# Patient Record
Sex: Female | Born: 1950
Health system: Southern US, Community
[De-identification: ages and names within clinical notes are randomized; demographics above are authoritative.]

## PROBLEM LIST (undated history)

## (undated) DIAGNOSIS — K219 Gastro-esophageal reflux disease without esophagitis: Secondary | ICD-10-CM

## (undated) DIAGNOSIS — R519 Headache, unspecified: Secondary | ICD-10-CM

## (undated) DIAGNOSIS — J302 Other seasonal allergic rhinitis: Secondary | ICD-10-CM

## (undated) DIAGNOSIS — I10 Essential (primary) hypertension: Secondary | ICD-10-CM

## (undated) DIAGNOSIS — I499 Cardiac arrhythmia, unspecified: Secondary | ICD-10-CM

## (undated) DIAGNOSIS — Z973 Presence of spectacles and contact lenses: Secondary | ICD-10-CM

## (undated) HISTORY — DX: Cardiac arrhythmia, unspecified: I49.9

## (undated) HISTORY — PX: BREAST LUMPECTOMY: SHX2

## (undated) HISTORY — PX: FRACTURE SURGERY: SHX138

## (undated) HISTORY — PX: DILATION AND CURETTAGE OF UTERUS: SHX78

## (undated) HISTORY — PX: COLONOSCOPY: SHX174

## (undated) HISTORY — PX: EYE SURGERY: SHX253

---
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
## 1990-10-26 HISTORY — PX: DIAGNOSTIC LAPAROSCOPY: SUR761

## 2000-01-02 ENCOUNTER — Other Ambulatory Visit: Admission: RE | Admit: 2000-01-02 | Discharge: 2000-01-02 | Payer: Self-pay | Admitting: Family Medicine

## 2001-02-22 ENCOUNTER — Other Ambulatory Visit: Admission: RE | Admit: 2001-02-22 | Discharge: 2001-02-22 | Payer: Self-pay | Admitting: Family Medicine

## 2002-01-27 ENCOUNTER — Other Ambulatory Visit: Admission: RE | Admit: 2002-01-27 | Discharge: 2002-01-27 | Payer: Self-pay | Admitting: Family Medicine

## 2002-01-31 ENCOUNTER — Encounter: Payer: Self-pay | Admitting: Family Medicine

## 2002-01-31 ENCOUNTER — Encounter: Admission: RE | Admit: 2002-01-31 | Discharge: 2002-01-31 | Payer: Self-pay | Admitting: Family Medicine

## 2003-02-08 ENCOUNTER — Other Ambulatory Visit: Admission: RE | Admit: 2003-02-08 | Discharge: 2003-02-08 | Payer: Self-pay | Admitting: Family Medicine

## 2003-03-08 ENCOUNTER — Encounter: Payer: Self-pay | Admitting: Family Medicine

## 2003-03-08 ENCOUNTER — Encounter: Admission: RE | Admit: 2003-03-08 | Discharge: 2003-03-08 | Payer: Self-pay | Admitting: Family Medicine

## 2004-03-06 ENCOUNTER — Other Ambulatory Visit: Admission: RE | Admit: 2004-03-06 | Discharge: 2004-03-06 | Payer: Self-pay | Admitting: Family Medicine

## 2004-10-10 ENCOUNTER — Ambulatory Visit: Payer: Self-pay | Admitting: Family Medicine

## 2004-12-08 ENCOUNTER — Ambulatory Visit: Payer: Self-pay | Admitting: Internal Medicine

## 2005-02-24 ENCOUNTER — Ambulatory Visit: Payer: Self-pay | Admitting: Family Medicine

## 2005-02-24 ENCOUNTER — Other Ambulatory Visit: Admission: RE | Admit: 2005-02-24 | Discharge: 2005-02-24 | Payer: Self-pay | Admitting: Family Medicine

## 2005-10-26 LAB — CONVERTED CEMR LAB: Pap Smear: NORMAL

## 2007-06-15 ENCOUNTER — Ambulatory Visit: Payer: Self-pay | Admitting: Family Medicine

## 2007-06-16 DIAGNOSIS — I10 Essential (primary) hypertension: Secondary | ICD-10-CM | POA: Insufficient documentation

## 2007-07-18 ENCOUNTER — Ambulatory Visit: Payer: Self-pay | Admitting: Family Medicine

## 2007-07-18 DIAGNOSIS — M654 Radial styloid tenosynovitis [de Quervain]: Secondary | ICD-10-CM | POA: Insufficient documentation

## 2007-07-27 LAB — CONVERTED CEMR LAB: Pap Smear: NORMAL

## 2007-08-02 ENCOUNTER — Telehealth (INDEPENDENT_AMBULATORY_CARE_PROVIDER_SITE_OTHER): Payer: Self-pay | Admitting: *Deleted

## 2007-09-27 ENCOUNTER — Telehealth: Payer: Self-pay | Admitting: Family Medicine

## 2008-06-06 ENCOUNTER — Ambulatory Visit: Payer: Self-pay | Admitting: Family Medicine

## 2008-06-14 ENCOUNTER — Encounter: Payer: Self-pay | Admitting: Family Medicine

## 2008-06-14 DIAGNOSIS — R7309 Other abnormal glucose: Secondary | ICD-10-CM | POA: Insufficient documentation

## 2008-06-14 LAB — CONVERTED CEMR LAB
ALT: 19 units/L (ref 0–35)
AST: 12 units/L (ref 0–37)
Albumin: 4.3 g/dL (ref 3.5–5.2)
Alkaline Phosphatase: 81 units/L (ref 39–117)
BUN: 22 mg/dL (ref 6–23)
CO2: 23 meq/L (ref 19–32)
Calcium: 9.1 mg/dL (ref 8.4–10.5)
Chloride: 103 meq/L (ref 96–112)
Creatinine, Ser: 0.72 mg/dL (ref 0.40–1.20)
Glucose, Bld: 119 mg/dL — ABNORMAL HIGH (ref 70–99)
Potassium: 3.7 meq/L (ref 3.5–5.3)
Sodium: 140 meq/L (ref 135–145)
TSH: 1.216 microintl units/mL (ref 0.350–4.50)
Total Bilirubin: 0.7 mg/dL (ref 0.3–1.2)
Total Protein: 7.3 g/dL (ref 6.0–8.3)

## 2008-07-18 ENCOUNTER — Ambulatory Visit: Payer: Self-pay | Admitting: Family Medicine

## 2009-06-19 ENCOUNTER — Ambulatory Visit (HOSPITAL_COMMUNITY): Admission: RE | Admit: 2009-06-19 | Discharge: 2009-06-19 | Payer: Self-pay | Admitting: Family Medicine

## 2009-06-19 ENCOUNTER — Ambulatory Visit: Payer: Self-pay | Admitting: Family Medicine

## 2009-06-19 DIAGNOSIS — N63 Unspecified lump in unspecified breast: Secondary | ICD-10-CM | POA: Insufficient documentation

## 2009-06-19 LAB — CONVERTED CEMR LAB
ALT: 22 units/L (ref 0–35)
AST: 15 units/L (ref 0–37)
Albumin: 4.1 g/dL (ref 3.5–5.2)
Alkaline Phosphatase: 71 units/L (ref 39–117)
BUN: 17 mg/dL (ref 6–23)
CO2: 23 meq/L (ref 19–32)
Calcium: 9.3 mg/dL (ref 8.4–10.5)
Chloride: 100 meq/L (ref 96–112)
Cholesterol: 178 mg/dL (ref 0–200)
Creatinine, Ser: 0.82 mg/dL (ref 0.40–1.20)
Glucose, Bld: 113 mg/dL — ABNORMAL HIGH (ref 70–99)
HDL: 41 mg/dL (ref >39)
LDL Cholesterol: 114 mg/dL — ABNORMAL HIGH (ref 0–99)
Potassium: 4.1 meq/L (ref 3.5–5.3)
Sodium: 139 meq/L (ref 135–145)
Total Bilirubin: 0.5 mg/dL (ref 0.3–1.2)
Total CHOL/HDL Ratio: 4.3
Total Protein: 7.3 g/dL (ref 6.0–8.3)
Triglycerides: 113 mg/dL (ref <150)
VLDL: 23 mg/dL (ref 0–40)

## 2009-06-20 ENCOUNTER — Encounter: Payer: Self-pay | Admitting: Family Medicine

## 2009-06-27 ENCOUNTER — Encounter: Payer: Self-pay | Admitting: Family Medicine

## 2009-07-26 ENCOUNTER — Ambulatory Visit: Payer: Self-pay | Admitting: Family Medicine

## 2009-07-26 LAB — CONVERTED CEMR LAB: Rapid Strep: NEGATIVE

## 2010-06-26 ENCOUNTER — Telehealth: Payer: Self-pay | Admitting: Family Medicine

## 2010-07-30 ENCOUNTER — Ambulatory Visit: Payer: Self-pay | Admitting: Family Medicine

## 2010-07-30 LAB — CONVERTED CEMR LAB
Albumin: 4.3 g/dL (ref 3.5–5.2)
BUN: 19 mg/dL (ref 6–23)
CO2: 27 meq/L (ref 19–32)
Calcium: 9.2 mg/dL (ref 8.4–10.5)
Chloride: 99 meq/L (ref 96–112)
Cholesterol: 176 mg/dL (ref 0–200)
Creatinine, Ser: 0.74 mg/dL (ref 0.40–1.20)
Glucose, Bld: 117 mg/dL — ABNORMAL HIGH (ref 70–99)
Glucose, Urine, Semiquant: NEGATIVE
HDL: 40 mg/dL (ref >39)
Hgb A1c MFr Bld: 5.9 %
Nitrite: NEGATIVE
Potassium: 3.9 meq/L (ref 3.5–5.3)
Specific Gravity, Urine: 1.02
Total CHOL/HDL Ratio: 4.4
Triglycerides: 99 mg/dL (ref <150)
WBC Urine, dipstick: NEGATIVE
pH: 7

## 2010-08-01 ENCOUNTER — Encounter: Payer: Self-pay | Admitting: Family Medicine

## 2010-08-01 DIAGNOSIS — J45909 Unspecified asthma, uncomplicated: Secondary | ICD-10-CM | POA: Insufficient documentation

## 2010-08-04 ENCOUNTER — Encounter: Payer: Self-pay | Admitting: Family Medicine

## 2010-09-08 ENCOUNTER — Ambulatory Visit: Payer: Self-pay | Admitting: Family Medicine

## 2010-09-08 DIAGNOSIS — M25579 Pain in unspecified ankle and joints of unspecified foot: Secondary | ICD-10-CM | POA: Insufficient documentation

## 2010-11-26 NOTE — Progress Notes (Signed)
Summary: refill  Phone Note Refill Request Call back at Home Phone [phone] Message from:  Patient  Refills Requested: Medication #1:  HYZAAR 100-25 MG  TABS once daily Next Appointment Scheduled: 07/30/10 Initial call taken by: De Nurse,  June 26, 2010 11:54 AM    Prescriptions: Mauri Reading 100-25 MG  TABS (LOSARTAN POTASSIUM-HCTZ) once daily  #90 x 3   Entered and Authorized by:   Denny Levy MD   Signed by:   Denny Levy MD on 06/26/2010   Method used:   Electronically to        [facility]* (retail)       [facility] [place], [place]  [address]       Ph: [phone]       Fax: [phone]   RxID:   [id number]

## 2010-11-26 NOTE — Assessment & Plan Note (Signed)
Summary: l foot pain/swelling,mc   Vital Signs:  Patient profile:   60 year old female Height:      68 inches Weight:      235 pounds BP sitting:   138 / 82  Vitals Entered By: Denny Levy MD (September 08, 2010 2:51 PM)   History of Present Illness: Long standong B top of foot pain--worse in last 6 months. Also has had several right ankle sprains throughout life and now has chronic right ankle pain--it is east tio twist or "roll".  Pain is at area of foot / anterior ankle, achy and woorse with standing or a lot of walking. rest makes it some better. has to wear "dress" shoes at work--tennis shoes when she walks for exercise. On her feet a fair amount at work--walking more than standing.  also needs refill on BP med. No problems with that.   PERTINENT PMH/PSH: No fractures. No  ankle or foot surgery.  Preventive Screening-Counseling & Management  Alcohol-Tobacco     Smoking Status: never  Current Medications (verified): 1)  Hyzaar 100-25 Mg  Tabs (Losartan Potassium-Hctz) .... Once Daily 2)  Norvasc 10 Mg  Tabs (Amlodipine Besylate) .... Once Daily 3)  Aspirin 81 Mg  Tbec (Aspirin) .... One By Mouth Every Day 4)  Proventil Hfa 108 (90 Base) Mcg/act Aers (Albuterol Sulfate) .... 2 Puffs Q 6 Hrs As Needed Asthma 5)  Fluticasone Propionate 50 Mcg/act Susp (Fluticasone Propionate) .Marland Kitchen.. 1-2 Sprays Each Nostril Qd  Allergies (verified): No Known Drug Allergies  Family History: Mother w breast cancer x 2,became metastatic and led to her death.--older age occurence. 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  mildly overweight Msk:  Bilateral ankles no edema, no redness or warmth. Decreased ability to do heel raises, weakeron right.  STANCE: heels are in hindfoot valgus GAIT normal stride length, mild out toeing B. LEft ankle wobbles into more hindfoot valgus during her stance phase.  FEET mild callous formation, no lesions, neurovascularly intact. Loss of longitudinal and transcverse arch B. Additional Exam:  Patient was fitted for a : standard, cushioned, semi-rigid orthotic. The orthotic was heated and afterward the patient stood on the orthotic blank positioned on the orthotic stand. The patient was positioned in subtalar neutral position and 10 degrees of ankle dorsiflexion in a weight bearing stance. After completion of molding, a stable base was applied to the orthotic blank. The blank was ground to a stable position for weight bearing. Size:9 Base:none used as this was a dress shoe Posting:  medial meel B, medial forefoot    Impression & Recommendations:  Problem # 1:  FOOT PAIN, BILATERAL (ICD-729.5)  Orders: Orthotic Materials, each unit (Z6109)  Problem # 2:  ANKLE PAIN, RIGHT (ICD-719.47)  Orders: Orthotic Materials, each unit (L3002) custom molded otrthotics made spending 45 minutes face to face. She has an old ankle injury and we gave her HO for HEP and expplained it for ankle rehab. The orthotics would only fit into her dress shoes---wheish is what she wears daily---without a base. We will try this. We posted the blank appropriately. If this doe snot work, we  can try again with a base--I think given her fairly significant foot issues that would be better--but she wil have toincrease her shoe size--or alternatively we could do a pair for her work out (walking) shoes. Discussed.  Complete Medication List: 1)  Hyzaar 100-25 Mg Tabs (Losartan potassium-hctz) .... Once daily 2)  Norvasc 10 Mg Tabs (Amlodipine besylate)  .... Once daily 3)  Aspirin 81 Mg Tbec (Aspirin) .... One by mouth every day 4)  Proventil Hfa 108 (90 Base) Mcg/act Aers (Albuterol sulfate) .... 2 puffs q 6 hrs as needed asthma 5)  Fluticasone Propionate 50 Mcg/act Susp (Fluticasone propionate) .Marland Kitchen.. 1-2 sprays each nostril qd   Orders Added: 1)  Est. Patient Level IV [64403] 2)  Orthotic Materials, each unit [L3002]

## 2010-11-26 NOTE — Letter (Signed)
Summary: LAB Letter  [facility]  [address]   [place], [place] [address]   Phone: [phone]  Fax: [phone]    08/01/2010  [facility] [address] [place], [place]  [address]  Dear Ms. Zook,   Your other labs including electrolytes, kidney and liver function were all normal. Your total cholesterol looks good at 176, HDL 40 and LDL116. Great to see you!        Sincerely,   Denny Levy MD   Appended Document: LAB Letter mailed

## 2010-11-26 NOTE — Assessment & Plan Note (Signed)
Summary: cpe,df   Vital Signs:  Patient profile:   60 year old female Height:      67 inches Weight:      242.6 pounds BMI:     38.13 Temp:     98.4 degrees F Pulse rate:   87 / minute BP sitting:   139 / 87  Vitals Entered By: Golden Circle RN (July 30, 2010 8:27 AM)  History of Present Illness: cpe has gained some weight stress level increased at work---3 weeks ago she started walking oin her lunch hour and tis is helping stress some. has not used advair in a year--no wheezing, cough or SOB  Still issues with allergies--claritin OTC helps some. has nasal spray (flonase) but not using it much  OBGYN does pap Mammogram in March  some mild irritation on urination intermittently, also some occasional urgency. no abdominal pain or fever  Habits & Providers  Alcohol-Tobacco-Diet     Alcohol drinks/day: <1     Tobacco Status: never  Exercise-Depression-Behavior     Does Patient Exercise: yes     Type of exercise: walk & stairs     Exercise (avg: min/session): 30-60     Times/week: 7     Have you felt down or hopeless? no     Have you felt little pleasure in things? no     STD Risk: never     Drug Use: never     Seat Belt Use: always  Current Medications (verified): 1)  Hyzaar 100-25 Mg  Tabs (Losartan Potassium-Hctz) .... Once Daily 2)  Norvasc 10 Mg  Tabs (Amlodipine Besylate) .... Once Daily 3)  Aspirin 81 Mg  Tbec (Aspirin) .... One By Mouth Every Day 4)  Proventil Hfa 108 (90 Base) Mcg/act Aers (Albuterol Sulfate) .... 2 Puffs Q 6 Hrs As Needed Asthma 5)  Fluticasone Propionate 50 Mcg/act Susp (Fluticasone Propionate) .Marland Kitchen.. 1-2 Sprays Each Nostril Qd  Allergies (verified): No Known Drug Allergies  Social History: Risk analyst Use:  always Drug Use:  never STD Risk:  never Does Patient Exercise:  yes  Review of Systems       The patient complains of weight gain.  The patient denies anorexia, fever, weight loss, hoarseness, chest pain, syncope, dyspnea on  exertion, peripheral edema, prolonged cough, headaches, hemoptysis, abdominal pain, and severe indigestion/heartburn.         Please see HPI for additional ROS.   Physical Exam  General:  alert, well-developed, well-nourished, and well-hydrated.   Eyes:  vision grossly intact, pupils equal, pupils round, and pupils reactive to light.   Ears:  R ear normal and L ear normal.   Nose:  no external deformity.   Mouth:  pharynx pink and moist.   Neck:  supple, full ROM, no masses, no thyromegaly, and normal carotid upstroke.   Lungs:  normal respiratory effort and normal breath sounds.   Heart:  normal rate, regular rhythm, and no murmur.   Abdomen:  soft, non-tender, and normal bowel sounds.   Genitalia:  deferred Msk:  normal ROM, no joint tenderness, no joint swelling, no joint warmth, and no redness over joints.   Pulses:  DP and radial B 2+ Extremities:  no edema Neurologic:  alert & oriented X3, cranial nerves II-XII intact, strength normal in all extremities, and gait normal.   Skin:  several skin tags, no worrisome lesions on complete skin exam Psych:  Oriented X3, memory intact for recent and remote, normally interactive, good eye contact, not anxious  appearing, and not depressed appearing.     Impression & Recommendations:  Problem # 1:  WELL ADULT EXAM (ICD-V70.0)  Orders: [facility] - Est  40-64 yrs ([address]) ensouraged exercise pap and pelvic and breast exam done by GYN Tresa Endo Spade) and are current flu shot at work   Problem # 2:  DYSURIA (ICD-788.1)  Orders: Urinalysis-[facility] (00000)   Problem # 3:  HYPERGLYCEMIA, BORDERLINE (ICD-790.29)  Orders: A1C-[facility] (60454) Lipid-[facility] ([address]-91478)   Complete Medication List: 1)  Hyzaar 100-25 Mg Tabs (Losartan potassium-hctz) .... Once daily 2)  Norvasc 10 Mg Tabs (Amlodipine besylate) .... Once daily 3)  Aspirin 81 Mg Tbec (Aspirin) .... One by mouth every day 4)  Proventil Hfa 108 (90 Base) Mcg/act Aers (Albuterol sulfate)  .... 2 puffs q 6 hrs as needed asthma 5)  Fluticasone Propionate 50 Mcg/act Susp (Fluticasone propionate) .Marland Kitchen.. 1-2 sprays each nostril qd Comp Met-[facility] [phone]) A1C-[facility] (57846) Urinalysis-[facility] (00000) Lipid-[facility] (96[address]-28413) [facility] - Est  40-64 yrs (244[address]) Prescriptions: PROVENTIL HFA 108 (90 BASE) MCG/ACT AERS (ALBUTEROL SULFATE) 2 puffs q 6 hrs as needed asthma  #1 x 12   Entered and Authorized by:   Denny Levy MD   Signed by:   Denny Levy MD on 07/30/2010   Method used:   Print then Give to Patient   RxID:   [id number] NORVASC 10 MG  TABS (AMLODIPINE BESYLATE) once daily  #90 x 3   Entered and Authorized by:   Denny Levy MD   Signed by:   Denny Levy MD on 07/30/2010   Method used:   Print then Give to Patient   RxID:   [id number]    Impression & Recommendations:  Orders: [facility] - Est  40-64 yrs ([address])   Orders: Urinalysis-[facility] (00000)   Orders: A1C-[facility] (60630) Lipid-[facility] ([address]-93235)   Complete Medication List: 1)  Hyzaar 100-25 Mg Tabs (Losartan potassium-hctz) .... Once daily 2)  Norvasc 10 Mg Tabs (Amlodipine besylate) .... Once daily 3)  Aspirin 81 Mg Tbec (Aspirin) .... One by mouth every day 4)  Proventil Hfa 108 (90 Base) Mcg/act Aers (Albuterol sulfate) .... 2 puffs q 6 hrs as needed asthma 5)  Fluticasone Propionate 50 Mcg/act Susp (Fluticasone propionate) .Marland Kitchen.. 1-2 sprays each nostril qd  Other Orders: Comp Met-[facility] [phone])   Laboratory Results   Urine Tests  Date/Time Received: July 30, 2010 9:06 AM  Date/Time Reported: July 30, 2010 9:48 AM   Routine Urinalysis   Color: yellow Appearance: Clear Glucose: negative   (Normal Range: Negative) Bilirubin: negative   (Normal Range: Negative) Ketone: negative   (Normal Range: Negative) Spec. Gravity: 1.020   (Normal Range: 1.003-1.035) Blood: trace-intact   (Normal Range: Negative) pH: 7.0   (Normal Range: 5.0-8.0) Protein: negative   (Normal Range: Negative) Urobilinogen: 0.2    (Normal Range: 0-1) Nitrite: negative   (Normal Range: Negative) Leukocyte Esterace: negative   (Normal Range: Negative)  Urine Microscopic WBC/HPF: rare RBC/HPF: 0-3 Bacteria/HPF: 1+ Epithelial/HPF: 5-10    Comments: ...............test performed by......Marland KitchenBonnie A. [place], MLS (ASCP)cm   Blood Tests   Date/Time Received: July 30, 2010 9:06 AM  Date/Time Reported: July 30, 2010 9:48 AM   HGBA1C: 5.9%   (Normal Range: Non-Diabetic - 3-6%   Control Diabetic - 6-8%)  Comments: ...............test performed by......Marland KitchenBonnie A. [place], MLS (ASCP)cm      Prevention & Chronic Care Immunizations   Influenza vaccine: given at [facility] 07/2010  (07/30/2010)    Tetanus booster: 10/27/2003: given   Tetanus  booster due: 10/26/2013    Pneumococcal vaccine: Not documented  Colorectal Screening   Hemoccult: Not documented    Colonoscopy: normal  (10/26/2005)   Colonoscopy due: 10/27/2015  Other Screening   Pap smear: normal  (07/27/2007)   Pap smear due: 07/26/2008    Mammogram: normal  (12/26/2007)   Mammogram due: 12/25/2008   Smoking status: never  (07/30/2010)  Lipids   Total Cholesterol: 178  (06/19/2009)   Lipid panel action/deferral: Lipid Panel ordered   LDL: 114  (06/19/2009)   LDL Direct: Not documented   HDL: 41  (06/19/2009)   Triglycerides: 113  (06/19/2009)  Hypertension   Last Blood Pressure: 139 / 87  (07/30/2010)   Serum creatinine: 0.82  (06/19/2009)   Serum potassium 4.1  (06/19/2009) CMP ordered     Hypertension flowsheet reviewed?: Yes   Progress toward BP goal: At goal  Self-Management Support :    Hypertension self-management support: Not documented

## 2010-11-26 NOTE — Letter (Signed)
Summary: [facility]  [address]   [place], [place] [address]   Phone: [phone]  Fax: [phone]    08/01/2010  [facility] [address] [place], [place]  [address]  Dear Ms. Golding,  Urine looked normal and your A1C 9longterm blood sugar) totally normal.         Sincerely,   Denny Levy MD  Appended Document: LABLetter mailed

## 2010-11-26 NOTE — Miscellaneous (Signed)
  Clinical Lists Changes  Problems: Changed problem from History of  ASTHMA (ICD-493.90) to History of  ASTHMA, INTERMITTENT, MILD (ICD-493.90)

## 2010-12-04 ENCOUNTER — Encounter: Payer: Self-pay | Admitting: *Deleted

## 2011-01-15 ENCOUNTER — Ambulatory Visit (INDEPENDENT_AMBULATORY_CARE_PROVIDER_SITE_OTHER): Payer: PRIVATE HEALTH INSURANCE | Admitting: Family Medicine

## 2011-01-15 ENCOUNTER — Encounter: Payer: Self-pay | Admitting: Family Medicine

## 2011-01-15 VITALS — BP 176/96 | Ht 68.0 in | Wt 240.0 lb

## 2011-01-15 DIAGNOSIS — M79609 Pain in unspecified limb: Secondary | ICD-10-CM

## 2011-01-15 DIAGNOSIS — I10 Essential (primary) hypertension: Secondary | ICD-10-CM

## 2011-01-15 DIAGNOSIS — M79673 Pain in unspecified foot: Secondary | ICD-10-CM

## 2011-01-16 NOTE — Progress Notes (Signed)
  Subjective:    Patient ID: [facility]ra [facility], female    DOB: Feb 25, 1951, 60 y.o.   MRN: [id number]  HPI  Continued (but improved) foot pain. We made her some orthotics and that has helped--she cannotfit them in her dress shoes however so many days she cannot wear them.Pain is dorsal foot / ankle.  Review of Systems Has gained a few pounds    Objective:   Physical Exam    GAIT significant  Varus tilt of her sbtalar joint during weight bearing    Assessment & Plan:  Foot deformity causing pain. Made a new pair of orthotics for dress shoes using a modified sports insole and some medial heel wedges to take pressure off ankle.

## 2011-01-16 NOTE — Assessment & Plan Note (Signed)
Elevated BP at todays visit even with recheck Will have her get BP readings for next few weeks and email them to me

## 2011-07-24 ENCOUNTER — Encounter: Payer: Self-pay | Admitting: Family Medicine

## 2011-07-24 ENCOUNTER — Ambulatory Visit (INDEPENDENT_AMBULATORY_CARE_PROVIDER_SITE_OTHER): Payer: PRIVATE HEALTH INSURANCE | Admitting: Family Medicine

## 2011-07-24 VITALS — BP 144/88 | HR 87

## 2011-07-24 DIAGNOSIS — M25571 Pain in right ankle and joints of right foot: Secondary | ICD-10-CM | POA: Insufficient documentation

## 2011-07-24 DIAGNOSIS — M79671 Pain in right foot: Secondary | ICD-10-CM

## 2011-07-24 DIAGNOSIS — M79609 Pain in unspecified limb: Secondary | ICD-10-CM

## 2011-07-24 NOTE — Patient Instructions (Signed)
I think you have a "stress reaction" (which is the continuum of stress fracture) of your 5th metatarsal head. Let's try the post op shoe WITH the insert for 2-3 weeks. At 7-10 days, ask yourself "Is this getting any better?" If hte answer is NO, then call me and we will do an additional imaging test, likely a foot x ray. If the answer is YES, then continue the shoe / insert and I will see you back in 3 weeks. Great to see you! Hello to your husband!

## 2011-07-24 NOTE — Progress Notes (Signed)
  Subjective:    Patient ID: Kendra Thompson, female    DOB: 1951-06-14, 60 y.o.   MRN: [id number]  HPI  Right lateral foot pain for about 2 months. First noticed it after wearing some new sandals. She did wear her orthotics and knees but had a lot of pain so eventually she threw the sandals away. She has not really warn her orthotics since then. The orthotics did seem to help with her ankle issues. Pain is in the right fifth metatarsal area. Worse with standing. No pain at rest. Sometimes walking is excruciating. Has noted no swelling, no redness, no warmth.  Review of Systems    think she's gained a little bit of weight. Denies fever, sweats, chills. Objective:   Physical Exam  GENERAL: Well-developed female in no acute distress. Overweight. FEET: Mild loss of longitudinal and transverse arch bilaterally. Intact sensation to soft touch. Some callus formation under the plantar surface of the fifth metatarsal head. This is the area of tenderness. Palpation here reproduces her pain. Negative squeeze test for the metatarsal heads. She is nontender to palpation at the proximal portion of the fifth metatarsal.  ULTRASOUND:  Edema noted around the fifth MTP joint. There is also a small calcified bone that is likely a sesamoid although it could be an old chip fracture. There is some arthritic change at the joint. Complete healing of the fifth metatarsal ray reveals no evidence of cortical edema or cortex disruption.      Assessment & Plan:  Right fifth metatarsal head pain. I think she has either  a sesamoiditis or a bursitis ? stress reaction at the fifth MTP.  Due to the callus on the plantar portion of the foot on lateral side it is evident  she's been doing a lot more weight on the lateral portion of her foot since we placed her in some medial heel wedge to help her ankle pain. The ankle pain has resolved. Today I placed her in a postop shoe with a metatarsal pad, small, for the right foot.  I'll see her back in 2-3 weeks. Where the shoe during the day. In one week if she's not improving she'll let me know and I would recommend doing x-ray at that time to make sure we've not missed something.

## 2011-08-04 ENCOUNTER — Other Ambulatory Visit: Payer: Self-pay | Admitting: Family Medicine

## 2011-08-04 ENCOUNTER — Encounter: Payer: Self-pay | Admitting: *Deleted

## 2011-08-04 DIAGNOSIS — M79671 Pain in right foot: Secondary | ICD-10-CM

## 2011-08-04 NOTE — Progress Notes (Unsigned)
  Subjective:    Patient ID: Kendra Thompson, female    DOB: 29-Jul-1951, 60 y.o.   MRN: [id number]  HPI    Review of Systems     Objective:   Physical Exam        Assessment & Plan:  Pt schdld to have MRI at [facility] on Monday October 22nd at 6pm. Pt to arrive at 5:30 for registration. Pt contacted and informed of this.

## 2011-08-04 NOTE — Progress Notes (Signed)
[place] Please set her up for RIGHT FOOT MRI Her daytime number is  Day time # [id number]. Not pregnant, no metal, no pacemaker, probably not claustrophobic THANKS! Denny Levy PS she may want it done at [facility] as she works there--I forget to ask her that question. You can call her THANKS! Denny Levy

## 2011-08-12 ENCOUNTER — Ambulatory Visit (INDEPENDENT_AMBULATORY_CARE_PROVIDER_SITE_OTHER): Payer: PRIVATE HEALTH INSURANCE | Admitting: Family Medicine

## 2011-08-12 ENCOUNTER — Encounter: Payer: Self-pay | Admitting: Family Medicine

## 2011-08-12 VITALS — BP 138/93 | HR 96 | Temp 98.3°F | Ht 68.0 in | Wt 238.0 lb

## 2011-08-12 DIAGNOSIS — Z23 Encounter for immunization: Secondary | ICD-10-CM

## 2011-08-12 DIAGNOSIS — I10 Essential (primary) hypertension: Secondary | ICD-10-CM

## 2011-08-12 DIAGNOSIS — Z0000 Encounter for general adult medical examination without abnormal findings: Secondary | ICD-10-CM

## 2011-08-12 DIAGNOSIS — R7309 Other abnormal glucose: Secondary | ICD-10-CM

## 2011-08-12 DIAGNOSIS — J45909 Unspecified asthma, uncomplicated: Secondary | ICD-10-CM

## 2011-08-12 LAB — COMPREHENSIVE METABOLIC PANEL
ALT: 27 U/L (ref 0–35)
AST: 14 U/L (ref 0–37)
Albumin: 4.3 g/dL (ref 3.5–5.2)
CO2: 25 mEq/L (ref 19–32)
Calcium: 9.4 mg/dL (ref 8.4–10.5)
Chloride: 100 mEq/L (ref 96–112)
Potassium: 3.7 mEq/L (ref 3.5–5.3)
Sodium: 138 mEq/L (ref 135–145)
Total Protein: 7.5 g/dL (ref 6.0–8.3)

## 2011-08-12 LAB — LIPID PANEL
Cholesterol: 175 mg/dL (ref 0–200)
Total CHOL/HDL Ratio: 4.5 Ratio

## 2011-08-12 MED ORDER — LOSARTAN POTASSIUM-HCTZ 100-25 MG PO TABS: 1.0000 | ORAL_TABLET | Freq: Every day | ORAL | Status: DC

## 2011-08-12 MED ORDER — PNEUMOCOCCAL VAC POLYVALENT 25 MCG/0.5ML IJ INJ: 0.5000 mL | INJECTION | Freq: Once | INTRAMUSCULAR | Status: DC

## 2011-08-12 MED ORDER — AMLODIPINE BESYLATE 10 MG PO TABS: 10.0000 mg | ORAL_TABLET | Freq: Every day | ORAL | Status: DC

## 2011-08-12 NOTE — Patient Instructions (Signed)
I will send you a note about your labwork. We discussed her colonoscopy and I think it is fine to wait another year or 2 for a repeat. We can rediscuss that again next year. If he should develop some symptoms of changing her stool, unusual weight loss, blood in her stool or abdominal pain, then I would recommend repeat sooner  Regarding the Zostavax vaccine, I would check with her insurance company and see how much they cover. If you need a prescription for that, just let me know and I will mail it to you. I did receive a copy of your mammogram and it was normal. If your cough does not resolve in the next week or so let me know..With your history of asthma, you  might benefit from 2-3 days of a steroid burst. I look forward to seeing next week after your foot MRI.  It is great to see today!

## 2011-08-13 ENCOUNTER — Encounter: Payer: Self-pay | Admitting: Family Medicine

## 2011-08-13 NOTE — Progress Notes (Signed)
Subjective:    Patient ID: [facility]ra [facility], female    DOB: December 14, 1950, 60 y.o.   MRN: [id number]  HPI #1. Here for preventive visit   Main issue are:  Marland Kitchen#2:   some weight gain. She is struggling with right foot pain. Has MRI scheduled next week. Has been unable to exercise. Feels like that's why her blood pressure is up a little bit as is her weight.  #3. Had a colonoscopy 5 years ago that was normal. Her initial colonoscopy prior to that showed some polyps and she had the early repeat. They have sent her cart telling her to time for another one. She has questions about this and wonders if she really needs to get one now or can wait till the full 10 years. She saw Dr. Judie Petit. I SHRA.  #4. HTN:Not having any problems with her blood pressure pills.   #5.Her allergies have been well controlled with the Flonase.   #6.astma: Has not had to use her rescue inhaler much at all. Over the last week she has had a cold with some nasal congestion and a little bit of nonproductive cough. No wheezing. No shortness of breath.   Review of Systems  Constitutional: Positive for activity change. Negative for fever and fatigue.  HENT: Positive for postnasal drip. Negative for neck stiffness.   Respiratory: Positive for cough. Negative for chest tightness, shortness of breath and wheezing.   Cardiovascular: Negative for chest pain and palpitations.  Gastrointestinal: Negative for abdominal pain and constipation.  Genitourinary: Negative for vaginal discharge.  Musculoskeletal: Negative for myalgias.  Skin: Negative for rash.  Neurological: Negative for dizziness and weakness.  Psychiatric/Behavioral: Negative for confusion and agitation.       Objective:   Physical Exam  Vital signs reviewed GENERALl: Well developed, well nourished, in no acute distress. NECK: Supple, FROM, without lymphadenopathy.  THYROID: normal without nodularity CAROTID ARTERIES: without bruits LUNGS: clear to auscultation  bilaterally. No wheezes or rales. HEART: Regular rate and rhythm, no murmurs ABDOMEN: soft with positive bowel sounds. No masses, no guarding, no rebound. NEURO: No gross focal deficits. Strength is normal and symmetrical in all 4 extremities. She rises easily out of a chair without any assistance and has a normal gait except for the fact that she is wearing a Cam Walker on the right foot so she has a little bit of a limp secondary to that. MSK: Right foot tender to palpation over the dorsal lateral surface. SKIN: Complete skin exam reveals normal nevi, no worrisome lesions. She does not have it a lot of actinic solar damage. She has one wart on the back of her right calf it appears benign. BREASTS: Bilaterally symmetrical. No nipple discharge, normal Arreola. There is no worrisome mass. Axilla are normal.        Assessment & Plan:  #1. Preventive visit. We discussed her colonoscopy. She had polyps removed on her first exam 15 years ago, a normal colonoscopy 5 years ago. GI send her a card recommending colonoscopy now at the five-year followup. She would like to postpone at least a year. Her only risk factor is the polyps are found I don't have her record of their exact pathology. Given the five-year recommend followup I suspect they were adenomas. She did have a brother with stomach cancer but that was thought to be secondary to his alcoholism. We agreed to compromise and do her colonoscopy somewhere between 5 and 10 year range probably about the sixth or seventh year.  She has received her flu shot and her tetanus shot. Her mammogram was in April and was normal. We will give her Pneumovax today as she is a little bit immunocompromised secondary to her asthma.  Health Maintenance  Topic Date Due  . Influenza Vaccine  07/26/2012  . Mammogram  02/09/2013  . Pap Smear  11/19/2013  . Colonoscopy  04/11/2017  . Tetanus/tdap  08/05/2021   #2. Hypertension. She's a little bit suboptimally controlled.  We discussed. We decided to go ahead and take care of her foot, get her back into some regular exercise in in followup her blood pressure in about 2-1/2 months. If she still elevated at that time then I would recommend to add an additional agent.  Labs today. I will see her next week in followup of her foot pain after her MRI.

## 2011-08-14 ENCOUNTER — Ambulatory Visit: Payer: PRIVATE HEALTH INSURANCE | Admitting: Family Medicine

## 2011-08-18 ENCOUNTER — Ambulatory Visit: Payer: PRIVATE HEALTH INSURANCE | Admitting: Family Medicine

## 2011-08-20 ENCOUNTER — Telehealth: Payer: Self-pay | Admitting: Family Medicine

## 2011-08-20 NOTE — Telephone Encounter (Signed)
Amy Plz call her and tell her the MRI showed NOTHING abnormal in her ;lateral foot. This is a little weird. What I would like is for her to go down to the radiology dept at [facility] and get a disc of the images and mail it to me. Until then, keep wearing the post op shoe. See also if you will whether or not she is doing any better?? THANKS! Denny Levy

## 2011-08-20 NOTE — Telephone Encounter (Signed)
Spoke with pt- she states she will get disc and mail to [place].   Also, states she was doing a little better this weekend, but walked a lot Monday -foot was very painful Tuesday.

## 2011-08-25 ENCOUNTER — Ambulatory Visit (INDEPENDENT_AMBULATORY_CARE_PROVIDER_SITE_OTHER): Payer: PRIVATE HEALTH INSURANCE | Admitting: Sports Medicine

## 2011-08-25 VITALS — BP 126/70

## 2011-08-25 DIAGNOSIS — M79671 Pain in right foot: Secondary | ICD-10-CM

## 2011-08-25 DIAGNOSIS — M79609 Pain in unspecified limb: Secondary | ICD-10-CM

## 2011-08-25 NOTE — Progress Notes (Signed)
  Subjective:    Patient ID: Kendra Thompson, female    DOB: Sep 15, 1951, 60 y.o.   MRN: [id number]  HPI Continuing right lateral foot pain. Has had her MRI. Has continued to wear the postop shoe with a metatarsal pad and worsens all with minimal relief.   Review of Systems No warmth or erythema of the right foot. No rash. No fever.    Objective:   Physical Exam  Vital signs reviewed. GENERAL: Well developed, well nourished, no acute distress FOOT: Right foot tender to palpation over the fifth distal metatarsal head area. Pressure on the plantar surface of this MTP joint reproduces her pain. I re\re ultrasound the area and there are some arthritic changes there is minimal fluid. MRI images reviewed with her in detail. There is no evidence of stress fracture. There is no evidence of soft tissue mass.    INJECTION: Patient was given informed consent, signed copy in the chart. Appropriate time out was taken. Area prepped and draped in usual sterile fashion. One half cc of methylprednisolone 40 mg/ml plus  one half cc of 1% lidocaine without epinephrine was injected into the right fifth MTP joint using a(n) plantar approach. The patient tolerated the procedure well. There were no complications. Post procedure instructions were given.     Assessment & Plan:  Long discussion with her regarding her options. At this point the only source I can consistently think would give her this type of pain is arthritic change at the MTP #5. We discussed options and decided to try a corticosteroid injection. We'll continue the postop shoe. She's not had improvement in the next 7-10 days I would refer her to orthopedic surgery for further eval.

## 2011-08-25 NOTE — Patient Instructions (Signed)
Let me know in 7 days if improved or not If not, surgery eval

## 2011-09-02 ENCOUNTER — Encounter: Payer: Self-pay | Admitting: Family Medicine

## 2011-11-11 ENCOUNTER — Ambulatory Visit (INDEPENDENT_AMBULATORY_CARE_PROVIDER_SITE_OTHER): Payer: PRIVATE HEALTH INSURANCE | Admitting: Family Medicine

## 2011-11-11 ENCOUNTER — Encounter: Payer: Self-pay | Admitting: Family Medicine

## 2011-11-11 VITALS — BP 138/78 | HR 98 | Temp 98.6°F | Ht 68.0 in | Wt 252.0 lb

## 2011-11-11 DIAGNOSIS — IMO0002 Reserved for concepts with insufficient information to code with codable children: Secondary | ICD-10-CM

## 2011-11-11 DIAGNOSIS — S46919A Strain of unspecified muscle, fascia and tendon at shoulder and upper arm level, unspecified arm, initial encounter: Secondary | ICD-10-CM

## 2011-11-11 DIAGNOSIS — M654 Radial styloid tenosynovitis [de Quervain]: Secondary | ICD-10-CM

## 2011-11-11 DIAGNOSIS — I10 Essential (primary) hypertension: Secondary | ICD-10-CM

## 2011-11-12 NOTE — Progress Notes (Signed)
  Subjective:    Patient ID: Kendra Thompson, female    DOB: 12/22/1950, 61 y.o.   MRN: [id number]  HPI  1. Left wrist / thumb pain. Has had i previously--she is right hand dominant. Pain worse with certain motions. No numbness of fingers. No specific injury. Pain 4-5 /10 at worst. 2. F/u foot pain---much better since the injection. 3. Upper back and right shoulder blade area pain. 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 F/u htn--taking her meds reg--no problems. Has put on weight over holidays. Denies chest pain or SOB, no dizziness.  Review of Systems Pertinent review of systems: negative for fever .     Objective:   Physical Exam  Vital signs reviewed. GENERAL: Well developed, well nourished, no acute distress CV RRR WRIST ttp left thumb tendons, APB more so than APL, FROm of thumb and normal hand strength including grip and intrinsics. Resisted thumb extension reproduces pain. No redness or deformity of thumb or hand. BACK ttp right rhomboid area      Assessment & Plan:  1. htn--better control. Discussed weight loss. May start weight watchers 2. De quervains---CSI and she will use her brace for several adays. When pain improved, will start HEP which we discussed 3, rhomboid strain---shoulder shrugs and push ups, posture arareness 4. Foot---seems resolved

## 2012-01-27 ENCOUNTER — Encounter: Payer: Self-pay | Admitting: Family Medicine

## 2012-02-10 ENCOUNTER — Ambulatory Visit (INDEPENDENT_AMBULATORY_CARE_PROVIDER_SITE_OTHER): Payer: PRIVATE HEALTH INSURANCE | Admitting: Family Medicine

## 2012-02-10 ENCOUNTER — Encounter: Payer: Self-pay | Admitting: Family Medicine

## 2012-02-10 VITALS — BP 122/76 | HR 90 | Temp 98.5°F | Ht 68.0 in | Wt 246.1 lb

## 2012-02-10 DIAGNOSIS — R1013 Epigastric pain: Secondary | ICD-10-CM

## 2012-02-10 DIAGNOSIS — R229 Localized swelling, mass and lump, unspecified: Secondary | ICD-10-CM

## 2012-02-10 DIAGNOSIS — R7309 Other abnormal glucose: Secondary | ICD-10-CM

## 2012-02-10 DIAGNOSIS — R2230 Localized swelling, mass and lump, unspecified upper limb: Secondary | ICD-10-CM

## 2012-02-10 LAB — BASIC METABOLIC PANEL
BUN: 19 mg/dL (ref 6–23)
CO2: 26 mEq/L (ref 19–32)
Chloride: 99 mEq/L (ref 96–112)
Glucose, Bld: 111 mg/dL — ABNORMAL HIGH (ref 70–99)
Potassium: 3.7 mEq/L (ref 3.5–5.3)
Sodium: 137 mEq/L (ref 135–145)

## 2012-02-11 ENCOUNTER — Encounter: Payer: Self-pay | Admitting: Family Medicine

## 2012-02-11 DIAGNOSIS — R1013 Epigastric pain: Secondary | ICD-10-CM | POA: Insufficient documentation

## 2012-02-11 MED ORDER — ESOMEPRAZOLE MAGNESIUM 40 MG PO CPDR: 40.0000 mg | DELAYED_RELEASE_CAPSULE | Freq: Every day | ORAL | Status: DC

## 2012-02-11 NOTE — Progress Notes (Signed)
  Subjective:    Patient ID: Kendra Thompson, female    DOB: 1950/11/11, 61 y.o.   MRN: [id number]  HPI  #1. Having a lot of mid epigastric pain that radiates directly through to her back. She has used over-the-counter Prilosec twice each for a 14 day period. She had some improvement but the minute she stopped the Prilosec it worsened again she is very worried as her half brother died from stomach cancer in his early 76s. She would like to consider EGD. #2. New lump on her right arm. She just noticed it in the last couple of weeks. She doesn't recall being there before but doesn't think it's grown since she's solid first. In fact it may have decreased in size. 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 She wonders if she banged it against something. It is mildly tender, there's been no bruising. She's not having numbness in her hand and no change in muscle function of her arm or hand. #3. Overweight: She has continued to try to need better but has not been doing a lot of regular exercise. She has lost a small amount of weight.  Review of Systems    denies fever, sweats, chills. Please see history of present illness above for pertinent review of systems. Objective:   Physical Exam  Vital signs reviewed. GENERAL: Well developed, well nourished, no acute distress MSK: Right forearm is normal in muscle bulk and tone. There is a circular mass that's about 4 mm high in about 1.2 cm in diameter on the extensor portion of the muscle mass. It is slightly mobile, a little bit poorly defined around the borders. It is soft and nontender. The rest of the right upper extremity exam including neurovascular status is normal.      Assessment & Plan:  #1. Increased epigastric pain probably related to reflux. With her family history of stomach cancer I will go ahead and refer her for EGD. I will start her on nexium. #2. Mass on the right arm. Most likely this is the beginning of a lipoma  or this is result of some trauma with contusion. We decided after discussion to follow it for the next 2-3 weeks and if it remains I will see her at sports medicine clinic where we will do an ultrasound to further evaluate. We discussed whether or not she wanted to move directly to MRI and she is comfortable with following a conservative approach. Should it suddenly started to enlarge or change before I see her back she'll let me know. #3. Obesity. Encouraged to continue her weight loss efforts. Will recheck her blood sugar today as she has had elevated random blood sugar recently.

## 2012-02-12 ENCOUNTER — Encounter: Payer: Self-pay | Admitting: Gastroenterology

## 2012-02-15 ENCOUNTER — Telehealth: Payer: Self-pay | Admitting: Family Medicine

## 2012-02-15 NOTE — Telephone Encounter (Signed)
LVM to inform patient that I am faxing over the endo form to [facility] and they will contact her about appointment

## 2012-02-15 NOTE — Telephone Encounter (Signed)
Patient is calling because she needs her Endoscopy to be schedule with Dr. Malachi Paradise at [facility] - [phone].  It was scheduled with  and needs to be changed to Dr. Donnal Debar for her insurance to cover as much as possible.

## 2012-02-22 ENCOUNTER — Encounter: Payer: Self-pay | Admitting: Family Medicine

## 2012-03-03 ENCOUNTER — Ambulatory Visit: Payer: PRIVATE HEALTH INSURANCE | Admitting: Gastroenterology

## 2012-04-04 ENCOUNTER — Ambulatory Visit (INDEPENDENT_AMBULATORY_CARE_PROVIDER_SITE_OTHER): Payer: PRIVATE HEALTH INSURANCE | Admitting: Family Medicine

## 2012-04-04 ENCOUNTER — Encounter: Payer: Self-pay | Admitting: Family Medicine

## 2012-04-04 VITALS — BP 130/82 | HR 89 | Ht 68.0 in | Wt 246.0 lb

## 2012-04-04 DIAGNOSIS — R2230 Localized swelling, mass and lump, unspecified upper limb: Secondary | ICD-10-CM

## 2012-04-04 DIAGNOSIS — R229 Localized swelling, mass and lump, unspecified: Secondary | ICD-10-CM

## 2012-04-05 DIAGNOSIS — R2230 Localized swelling, mass and lump, unspecified upper limb: Secondary | ICD-10-CM | POA: Insufficient documentation

## 2012-04-05 NOTE — Progress Notes (Signed)
Patient ID: Kendra Thompson, female   DOB: [birth year], 61 y.o.   MRN: [id number] Several months mass in right forearm. Nontender. Has gotten a little but smaller she thinks. noskin changes, no parasthesias or loss of strength. Fingers and wrist have normal strength and ROM. See problem oriented documentation.

## 2012-04-25 ENCOUNTER — Encounter: Payer: Self-pay | Admitting: Family Medicine

## 2012-05-24 DIAGNOSIS — Z01419 Encounter for gynecological examination (general) (routine) without abnormal findings: Secondary | ICD-10-CM | POA: Insufficient documentation

## 2012-06-17 ENCOUNTER — Other Ambulatory Visit: Payer: Self-pay | Admitting: Family Medicine

## 2012-06-17 DIAGNOSIS — R1013 Epigastric pain: Secondary | ICD-10-CM

## 2012-06-17 MED ORDER — ESOMEPRAZOLE MAGNESIUM 40 MG PO CPDR: 40.0000 mg | DELAYED_RELEASE_CAPSULE | Freq: Every day | ORAL | Status: DC

## 2012-06-20 DIAGNOSIS — Z860100 Personal history of colon polyps, unspecified: Secondary | ICD-10-CM | POA: Insufficient documentation

## 2012-08-31 ENCOUNTER — Encounter: Payer: Self-pay | Admitting: Family Medicine

## 2012-08-31 ENCOUNTER — Ambulatory Visit (INDEPENDENT_AMBULATORY_CARE_PROVIDER_SITE_OTHER): Payer: PRIVATE HEALTH INSURANCE | Admitting: Family Medicine

## 2012-08-31 VITALS — BP 140/78 | HR 96 | Temp 97.7°F | Ht 68.0 in | Wt 244.8 lb

## 2012-08-31 DIAGNOSIS — R7309 Other abnormal glucose: Secondary | ICD-10-CM

## 2012-08-31 DIAGNOSIS — Z0000 Encounter for general adult medical examination without abnormal findings: Secondary | ICD-10-CM

## 2012-08-31 DIAGNOSIS — R1013 Epigastric pain: Secondary | ICD-10-CM

## 2012-08-31 DIAGNOSIS — I10 Essential (primary) hypertension: Secondary | ICD-10-CM

## 2012-08-31 LAB — COMPREHENSIVE METABOLIC PANEL
Albumin: 4.2 g/dL (ref 3.5–5.2)
Alkaline Phosphatase: 78 U/L (ref 39–117)
BUN: 18 mg/dL (ref 6–23)
Glucose, Bld: 99 mg/dL (ref 70–99)
Potassium: 3.7 mEq/L (ref 3.5–5.3)

## 2012-08-31 MED ORDER — LOSARTAN POTASSIUM-HCTZ 100-25 MG PO TABS: 1.0000 | ORAL_TABLET | Freq: Every day | ORAL | Status: DC

## 2012-08-31 MED ORDER — ESOMEPRAZOLE MAGNESIUM 40 MG PO CPDR: 40.0000 mg | DELAYED_RELEASE_CAPSULE | Freq: Every day | ORAL | Status: DC

## 2012-08-31 MED ORDER — ALBUTEROL SULFATE HFA 108 (90 BASE) MCG/ACT IN AERS: 2.0000 | INHALATION_SPRAY | Freq: Four times a day (QID) | RESPIRATORY_TRACT | Status: DC | PRN

## 2012-08-31 MED ORDER — AMLODIPINE BESYLATE 10 MG PO TABS
10.0000 mg | ORAL_TABLET | Freq: Every day | ORAL | Status: DC
Start: 2012-08-31 — End: 2013-09-24

## 2012-08-31 MED ORDER — FLUTICASONE PROPIONATE 50 MCG/ACT NA SUSP: 1.0000 | Freq: Every day | NASAL | Status: DC

## 2012-08-31 NOTE — Patient Instructions (Addendum)
I will send you a note of that your blood work. Everything else looks great. Best of luck with your job search. Let me know if anything comes up, otherwise I'll see you in 6 months to a  year.

## 2012-08-31 NOTE — Progress Notes (Signed)
  Subjective:    Patient ID: Kendra Thompson, female    DOB: June 24, 1951, 61 y.o.   MRN: [id number]  HPI  CPE #1.HTN F/U: Taking medicines regularly without problems. Denies chest pain, shortness of breath. Has lost a few pounds.  #2. GERD" no breakthrough unless she eats too large of a meal. Contniuing in daily nexium. Had EGD #3. Stressors: was laid off from her job.  Review of Systems  Constitutional: Negative for fever, activity change, appetite change, fatigue and unexpected weight change.  HENT: Negative for neck pain.   Eyes: Negative for visual disturbance.  Respiratory: Negative for shortness of breath and wheezing.   Cardiovascular: Negative for chest pain.  Genitourinary: Negative for dysuria.  Musculoskeletal: Negative for joint swelling.  Skin: Negative for rash.  Neurological: Negative for headaches.  Psychiatric/Behavioral: Negative for confusion, dysphoric mood, decreased concentration and agitation. The patient is not nervous/anxious.        Objective:   Physical Exam  Constitutional: She is oriented to person, place, and time. She appears well-developed and well-nourished.  HENT:  Right Ear: External ear normal.  Left Ear: External ear normal.  Nose: Nose normal.  Mouth/Throat: Oropharynx is clear and moist.  Eyes: Conjunctivae normal and EOM are normal. Pupils are equal, round, and reactive to light.  Neck: Normal range of motion. Neck supple. No JVD present. No thyromegaly present.  Cardiovascular: Normal rate, regular rhythm, normal heart sounds and intact distal pulses.   Pulmonary/Chest: Effort normal and breath sounds normal. She has no wheezes. She has no rales.  Abdominal: Soft. Bowel sounds are normal.  Musculoskeletal: Normal range of motion. She exhibits no edema.  Lymphadenopathy:    She has no cervical adenopathy.  Neurological: She is alert and oriented to person, place, and time.  Skin: No rash noted.       Several skin tags and benign nevi,  two SK, no worrisome lesions on complete skin exam  Psychiatric: She has a normal mood and affect. Her behavior is normal. Judgment and thought content normal.          Assessment & Plan:

## 2012-08-31 NOTE — Assessment & Plan Note (Signed)
Stable - continue nexium

## 2012-08-31 NOTE — Assessment & Plan Note (Signed)
No med changes. Discussed weight loss. Labs today. Refilled meds

## 2012-08-31 NOTE — Assessment & Plan Note (Signed)
Has had mammogram, colonoscopy, flu shot and is UTD on Tdap. Pap done by GYN 2 y ago and had pelvic done this year. Discussed increasing exercise.

## 2012-08-31 NOTE — Assessment & Plan Note (Signed)
Recheck random glucose

## 2012-09-01 ENCOUNTER — Encounter: Payer: Self-pay | Admitting: Family Medicine

## 2012-10-24 ENCOUNTER — Encounter (HOSPITAL_COMMUNITY): Payer: Self-pay | Admitting: Emergency Medicine

## 2012-10-24 ENCOUNTER — Emergency Department (HOSPITAL_COMMUNITY)
Admission: EM | Admit: 2012-10-24 | Discharge: 2012-10-24 | Disposition: A | Discharge status: Home / Self Care | Payer: PRIVATE HEALTH INSURANCE | Source: Home / Self Care | Attending: Emergency Medicine | Admitting: Emergency Medicine

## 2012-10-24 DIAGNOSIS — N3000 Acute cystitis without hematuria: Secondary | ICD-10-CM

## 2012-10-24 HISTORY — DX: Essential (primary) hypertension: I10

## 2012-10-24 LAB — POCT URINALYSIS DIP (DEVICE)
Glucose, UA: NEGATIVE mg/dL
Nitrite: NEGATIVE
Specific Gravity, Urine: 1.025 (ref 1.005–1.030)
Urobilinogen, UA: 0.2 mg/dL (ref 0.0–1.0)
pH: 6 (ref 5.0–8.0)

## 2012-10-24 MED ORDER — PHENAZOPYRIDINE HCL 200 MG PO TABS: 200.0000 mg | ORAL_TABLET | Freq: Three times a day (TID) | ORAL | Status: DC | PRN

## 2012-10-24 MED ORDER — CEPHALEXIN 500 MG PO CAPS: 500.0000 mg | ORAL_CAPSULE | Freq: Three times a day (TID) | ORAL | Status: DC

## 2012-10-24 NOTE — ED Notes (Signed)
Pt c/o poss uti x4 days... Sx include: freq/urgency, dysuria, abd pressure... Denies: fevers, vomiting, nauseas, diarrhea, vag discharge... She is alert w/no signs of acute distress.

## 2012-10-24 NOTE — ED Provider Notes (Signed)
Chief Complaint  Patient presents with  . Urinary Tract Infection    History of Present Illness:   Kendra Thompson is a 61 year old female who presents with a four-day history of dysuria, burning with urination, slight pain in the suprapubic area, frequency, and urgency. She denies any hematuria or GYN complaints. She has had no back pain, headache, fever, chills, nausea, or vomiting. She does have a history of urinary tract infections in the past, but her last one was last spring.  Review of Systems:  Other than noted above, the patient denies any of the following symptoms: General:  No fevers, chills, sweats, aches, or fatigue. GI:  No abdominal pain, back pain, nausea, vomiting, diarrhea, or constipation. GU:  No dysuria, frequency, urgency, hematuria, or incontinence. GYN:  No discharge, itching, vulvar pain or lesions, pelvic pain, or abnormal vaginal bleeding.  PMFSH:  Past medical history, family history, social history, meds, and allergies were reviewed.  Physical Exam:   Vital signs:  BP 151/84  Pulse 92  Temp 98.6 F (37 C) (Oral)  Resp 20  SpO2 97% Gen:  Alert, oriented, in no distress. Lungs:  Clear to auscultation, no wheezes, rales or rhonchi. Heart:  Regular rhythm, no gallop or murmer. Abdomen:  Flat and soft. There was slight suprapubic pain to palpation.  No guarding, or rebound.  No hepato-splenomegaly or mass.  Bowel sounds were normally active.  No hernia. Back:  No CVA tenderness.  Skin:  Clear, warm and dry.  Labs:   Results for orders placed during the hospital encounter of 10/24/12  POCT URINALYSIS DIP (DEVICE)      Component Value Range   Glucose, UA NEGATIVE  NEGATIVE mg/dL   Bilirubin Urine NEGATIVE  NEGATIVE   Ketones, ur NEGATIVE  NEGATIVE mg/dL   Specific Gravity, Urine 1.025  1.005 - 1.030   Hgb urine dipstick TRACE (*) NEGATIVE   pH 6.0  5.0 - 8.0   Protein, ur NEGATIVE  NEGATIVE mg/dL   Urobilinogen, UA 0.2  0.0 - 1.0 mg/dL   Nitrite NEGATIVE   NEGATIVE   Leukocytes, UA TRACE (*) NEGATIVE     Other Labs Obtained at Urgent Care Center:  A urine culture was obtained.  Results are pending at this time and we will call about any positive results.  Assessment: The encounter diagnosis was Acute cystitis.   Plan:   1.  The following meds were prescribed:   New Prescriptions   CEPHALEXIN (KEFLEX) 500 MG CAPSULE    Take 1 capsule (500 mg total) by mouth 3 (three) times daily.   PHENAZOPYRIDINE (PYRIDIUM) 200 MG TABLET    Take 1 tablet (200 mg total) by mouth 3 (three) times daily as needed for pain.   2.  The patient was instructed in symptomatic care and handouts were given. 3.  The patient was told to return if becoming worse in any way, if no better in 3 or 4 days, and given some red flag symptoms that would indicate earlier return. 4.  The patient was told to avoid intercourse for 10 days, get extra fluids, and return for a follow up with her primary care doctor at the completion of treatment for a repeat UA and culture.     Reuben Likes, MD 10/24/12 [phone]

## 2012-10-27 LAB — URINE CULTURE: Special Requests: NORMAL

## 2012-10-27 NOTE — ED Notes (Signed)
Urine culture: >100,000 colonies E. Coli.  Pt. adequately treated with Keflex.   Vassie Moselle 10/27/2012

## 2012-12-28 ENCOUNTER — Ambulatory Visit: Payer: PRIVATE HEALTH INSURANCE | Admitting: Family Medicine

## 2013-01-18 ENCOUNTER — Ambulatory Visit (INDEPENDENT_AMBULATORY_CARE_PROVIDER_SITE_OTHER): Payer: Managed Care, Other (non HMO) | Admitting: Family Medicine

## 2013-01-18 ENCOUNTER — Encounter: Payer: Self-pay | Admitting: Family Medicine

## 2013-01-18 VITALS — BP 167/73 | HR 91 | Temp 97.9°F | Ht 68.0 in | Wt 257.3 lb

## 2013-01-18 DIAGNOSIS — Z0000 Encounter for general adult medical examination without abnormal findings: Secondary | ICD-10-CM

## 2013-01-18 LAB — LIPID PANEL
Cholesterol: 175 mg/dL (ref 0–200)
HDL: 39 mg/dL — ABNORMAL LOW (ref >39)
LDL Cholesterol: 116 mg/dL — ABNORMAL HIGH (ref 0–99)
Triglycerides: 99 mg/dL (ref <150)

## 2013-01-18 LAB — BASIC METABOLIC PANEL
BUN: 20 mg/dL (ref 6–23)
Chloride: 102 mEq/L (ref 96–112)
Creat: 0.67 mg/dL (ref 0.50–1.10)
Potassium: 3.9 mEq/L (ref 3.5–5.3)

## 2013-01-19 ENCOUNTER — Encounter: Payer: Self-pay | Admitting: Family Medicine

## 2013-01-20 ENCOUNTER — Encounter: Payer: Self-pay | Admitting: Family Medicine

## 2013-01-20 NOTE — Progress Notes (Signed)
  Subjective:    Patient ID: Kendra Thompson, female    DOB: 1951-04-17, 62 y.o.   MRN: [id number]  HPI Here for a well adult check. Must get her cholesterol done. Needs a form filled out for her work. She has no problems. #2. Followup hypertension, no medication issues. She is doing well without shortness of breath or chest pain. No headaches. #3. Obesity. She is intermittently tried to do some exercise.   Review of Systems  Constitutional: Negative for fever, chills, activity change, appetite change, fatigue and unexpected weight change.  HENT: Negative for neck pain.   Eyes: Negative for pain.  Respiratory: Negative for cough, chest tightness and shortness of breath.   Genitourinary: Negative for dysuria.  Neurological: Negative for dizziness, light-headedness, numbness and headaches.  Psychiatric/Behavioral: Negative for sleep disturbance, dysphoric mood and decreased concentration. The patient is not nervous/anxious.        Objective:   Physical Exam  Constitutional: She is oriented to person, place, and time. She appears well-developed and well-nourished.  HENT:  Head: Normocephalic and atraumatic.  Right Ear: External ear normal.  Left Ear: External ear normal.  Mouth/Throat: Oropharynx is clear and moist.  Eyes: Conjunctivae and EOM are normal. Pupils are equal, round, and reactive to light.  Neck: Normal range of motion. Neck supple.  Cardiovascular: Normal rate, regular rhythm, normal heart sounds and intact distal pulses.   Pulmonary/Chest: Effort normal. She has no wheezes.  Abdominal: Soft. Bowel sounds are normal.  Neurological: She is alert and oriented to person, place, and time. She has normal reflexes.  Psychiatric: She has a normal mood and affect. Her behavior is normal. Judgment and thought content normal.          Assessment & Plan:  Well adult check. Filled out the form for her. To complete a form we need to get lipid profile for her.

## 2013-04-14 ENCOUNTER — Encounter: Payer: Self-pay | Admitting: Family Medicine

## 2013-05-01 ENCOUNTER — Telehealth: Payer: Self-pay | Admitting: Family Medicine

## 2013-05-01 NOTE — Telephone Encounter (Signed)
Spoke with patient and she will call and set up her own mammogram(gave number to breast center). She states that the referral is for her wrist pain that Dr. Jennette Kettle is familiar with. Informed patient that Dr. Jennette Kettle is out of  Town until next week and she is fine with waiting on the referral. Patient will be out of town next week and gave verbal ok to leave information on her home voicemail

## 2013-05-01 NOTE — Telephone Encounter (Signed)
PT is requesting a referral for occupation health and a mammogram. She can be reached at  [phone] at work. She will be away from her desk from 11-1. JW

## 2013-05-15 NOTE — Telephone Encounter (Signed)
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 And I will send itin. THANKS! Kendra Thompson

## 2013-05-16 ENCOUNTER — Other Ambulatory Visit: Payer: Self-pay | Admitting: Family Medicine

## 2013-05-16 NOTE — Telephone Encounter (Signed)
Patient has no preference for referral.  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  Patient states referral can be made to any MD in [facility] that Dr. Jennette Kettle recommends.  Will fwd to Dr. Jennette Kettle for review.  David Rodriquez, Darlyne Russian, CMA

## 2013-05-17 ENCOUNTER — Other Ambulatory Visit: Payer: Self-pay | Admitting: Family Medicine

## 2013-05-17 ENCOUNTER — Telehealth: Payer: Self-pay | Admitting: Family Medicine

## 2013-05-17 DIAGNOSIS — M654 Radial styloid tenosynovitis [de Quervain]: Secondary | ICD-10-CM

## 2013-05-17 NOTE — Telephone Encounter (Signed)
Patient returns a call from Dr. Jennette Kettle. Patient states that she is not interested in surgery at this time but would like to try Occupational Therapy for her tendonitis. Would like a referral made for this.

## 2013-05-17 NOTE — Progress Notes (Signed)
Dear [name]  or Lupita Leash Please set up occup therapy for her chronic left wrist tenosynopvitis THANKS! Denny Levy   Dear [facility] Team  Did this get set up? THANKS! Denny Levy

## 2013-05-30 NOTE — Progress Notes (Signed)
Appointment has already been scheduled for 06/01/2013 at 3:30pm with [facility].  Ileana Ladd

## 2013-06-01 ENCOUNTER — Ambulatory Visit: Payer: Managed Care, Other (non HMO) | Attending: Family Medicine | Admitting: Occupational Therapy

## 2013-06-01 DIAGNOSIS — M6281 Muscle weakness (generalized): Secondary | ICD-10-CM | POA: Insufficient documentation

## 2013-06-01 DIAGNOSIS — M25549 Pain in joints of unspecified hand: Secondary | ICD-10-CM | POA: Insufficient documentation

## 2013-06-01 DIAGNOSIS — M654 Radial styloid tenosynovitis [de Quervain]: Secondary | ICD-10-CM | POA: Insufficient documentation

## 2013-06-01 DIAGNOSIS — IMO0001 Reserved for inherently not codable concepts without codable children: Secondary | ICD-10-CM | POA: Insufficient documentation

## 2013-06-07 ENCOUNTER — Ambulatory Visit: Payer: Managed Care, Other (non HMO) | Admitting: Occupational Therapy

## 2013-06-12 ENCOUNTER — Ambulatory Visit: Payer: Managed Care, Other (non HMO) | Admitting: Occupational Therapy

## 2013-06-15 ENCOUNTER — Encounter: Payer: Managed Care, Other (non HMO) | Admitting: Occupational Therapy

## 2013-06-27 ENCOUNTER — Ambulatory Visit: Payer: Managed Care, Other (non HMO) | Attending: Family Medicine | Admitting: Occupational Therapy

## 2013-06-27 ENCOUNTER — Encounter: Payer: Managed Care, Other (non HMO) | Admitting: Occupational Therapy

## 2013-06-27 DIAGNOSIS — IMO0001 Reserved for inherently not codable concepts without codable children: Secondary | ICD-10-CM | POA: Insufficient documentation

## 2013-06-27 DIAGNOSIS — M6281 Muscle weakness (generalized): Secondary | ICD-10-CM | POA: Insufficient documentation

## 2013-06-27 DIAGNOSIS — M25549 Pain in joints of unspecified hand: Secondary | ICD-10-CM | POA: Insufficient documentation

## 2013-06-27 DIAGNOSIS — M654 Radial styloid tenosynovitis [de Quervain]: Secondary | ICD-10-CM | POA: Insufficient documentation

## 2013-06-29 ENCOUNTER — Encounter: Payer: Self-pay | Admitting: Family Medicine

## 2013-06-30 ENCOUNTER — Ambulatory Visit: Payer: Managed Care, Other (non HMO) | Admitting: Occupational Therapy

## 2013-07-04 ENCOUNTER — Ambulatory Visit: Payer: Managed Care, Other (non HMO) | Admitting: Occupational Therapy

## 2013-07-06 ENCOUNTER — Ambulatory Visit: Payer: Managed Care, Other (non HMO) | Admitting: Occupational Therapy

## 2013-07-11 ENCOUNTER — Encounter: Payer: Managed Care, Other (non HMO) | Admitting: Occupational Therapy

## 2013-07-13 ENCOUNTER — Ambulatory Visit: Payer: Managed Care, Other (non HMO) | Admitting: Occupational Therapy

## 2013-07-19 ENCOUNTER — Encounter: Payer: Managed Care, Other (non HMO) | Admitting: Occupational Therapy

## 2013-07-21 ENCOUNTER — Ambulatory Visit: Payer: Managed Care, Other (non HMO) | Admitting: Occupational Therapy

## 2013-07-23 ENCOUNTER — Emergency Department (INDEPENDENT_AMBULATORY_CARE_PROVIDER_SITE_OTHER): Payer: Managed Care, Other (non HMO)

## 2013-07-23 ENCOUNTER — Emergency Department (HOSPITAL_COMMUNITY)
Admission: EM | Admit: 2013-07-23 | Discharge: 2013-07-23 | Disposition: A | Discharge status: Home / Self Care | Payer: Managed Care, Other (non HMO) | Source: Home / Self Care | Attending: Family Medicine | Admitting: Family Medicine

## 2013-07-23 ENCOUNTER — Encounter (HOSPITAL_COMMUNITY): Payer: Self-pay | Admitting: Emergency Medicine

## 2013-07-23 DIAGNOSIS — S62309A Unspecified fracture of unspecified metacarpal bone, initial encounter for closed fracture: Secondary | ICD-10-CM

## 2013-07-23 DIAGNOSIS — S62308A Unspecified fracture of other metacarpal bone, initial encounter for closed fracture: Secondary | ICD-10-CM

## 2013-07-23 MED ORDER — TRAMADOL HCL 50 MG PO TABS: 50.0000 mg | ORAL_TABLET | Freq: Four times a day (QID) | ORAL | Status: DC | PRN

## 2013-07-23 NOTE — ED Notes (Signed)
C/o falling yesterday morning patient fall in parking lot leaving a concert.  Patient states she did hit her left hand and hit her nose.  Patient states both hands hurt but the left hurts worst and is unable to move the hand.  ibuprofen and ice treatment.

## 2013-07-23 NOTE — ED Provider Notes (Signed)
Kendra Thompson is a 62 y.o. female who presents to Urgent Care today for full left hand injury. Patient fell onto her outstretched hand yesterday. She tripped over an object. She notes left hand pain at the fifth fourth and third metacarpals. She's tried ice and ibuprofen. She notes continued bruising. She denies any radiating pain weakness or numbness. She notes pain with hand flexion that she attributes to swelling. She feels well otherwise with no nausea vomiting or diarrhea.   Past Medical History  Diagnosis Date  . Hypertension    History  Substance Use Topics  . Smoking status: Never Smoker   . Smokeless tobacco: Never Used  . Alcohol Use: No   ROS as above Medications reviewed. Current Facility-Administered Medications  Medication Dose Route Frequency Provider Last Rate Last Dose  . pneumococcal 23 valent vaccine (PNU-IMMUNE) injection 0.5 mL  0.5 mL Intramuscular Once Nestor Ramp, MD       Current Outpatient Prescriptions  Medication Sig Dispense Refill  . albuterol (PROVENTIL HFA) 108 (90 BASE) MCG/ACT inhaler Inhale 2 puffs into the lungs every 6 (six) hours as needed. asthma  18 g  12  . amLODipine (NORVASC) 10 MG tablet Take 1 tablet (10 mg total) by mouth daily.  90 tablet  3  . aspirin 81 MG EC tablet Take 81 mg by mouth daily.        . cephALEXin (KEFLEX) 500 MG capsule Take 1 capsule (500 mg total) by mouth 3 (three) times daily.  30 capsule  0  . esomeprazole (NEXIUM) 40 MG capsule Take 1 capsule (40 mg total) by mouth daily.  90 capsule  3  . fluticasone (FLONASE) 50 MCG/ACT nasal spray Place 1-2 sprays into the nose daily. Each nostril  16 g  12  . losartan-hydrochlorothiazide (HYZAAR) 100-25 MG per tablet Take 1 tablet by mouth daily.  90 tablet  3  . phenazopyridine (PYRIDIUM) 200 MG tablet Take 1 tablet (200 mg total) by mouth 3 (three) times daily as needed for pain.  15 tablet  0  . traMADol (ULTRAM) 50 MG tablet Take 1 tablet (50 mg total) by mouth every 6  (six) hours as needed for pain.  15 tablet  0    Exam:  BP 160/72  Pulse 85  Temp(Src) 98.8 F (37.1 C) (Oral)  Resp 18  SpO2 99% Gen: Well NAD LEFT HAND: Bruising and swelling over the ulnar 3 metacarpals.  Capillary refill and sensation intact distally No rotational deficit noted.    No results found for this or any previous visit (from the past 24 hour(s)). Dg Hand Complete Left  07/23/2013   CLINICAL DATA:  Fall. Left lateral hand pain and swelling.  EXAM: LEFT HAND - COMPLETE 3+ VIEW  COMPARISON:  None.  FINDINGS: A minimally displaced oblique fracture is seen involving the base of the 5th metacarpal. No other fractures are identified. No evidence of dislocation. Lateral soft tissue swelling noted.  IMPRESSION: Minimally displaced fracture through the base of the 5th metacarpal.   Electronically Signed   By: Myles Rosenthal   On: 07/23/2013 09:34   Patient was placed into an ulnar gutter wrist and hand splint  Assessment and Plan: 62 y.o. female with 5th metacarpal fracture.  Ulnar gutter splint.  Plan to followup with hand surgery this week.  Tramadol for pain Discussed warning signs or symptoms. Please see discharge instructions. Patient expresses understanding.     Rodolph Bong, MD 07/23/13 1144

## 2013-07-24 ENCOUNTER — Encounter (HOSPITAL_BASED_OUTPATIENT_CLINIC_OR_DEPARTMENT_OTHER): Payer: Self-pay | Admitting: *Deleted

## 2013-07-24 NOTE — Progress Notes (Signed)
Add on needs istat -ekg

## 2013-07-24 NOTE — Progress Notes (Signed)
07/24/13 1713  OBSTRUCTIVE SLEEP APNEA  Have you ever been diagnosed with sleep apnea through a sleep study? No  Do you snore loudly (loud enough to be heard through closed doors)?  1  Do you often feel tired, fatigued, or sleepy during the daytime? 0  Has anyone observed you stop breathing during your sleep? 0  Do you have, or are you being treated for high blood pressure? 1  BMI more than 35 kg/m2? 1  Age over 61 years old? 1  Gender: 0  Obstructive Sleep Apnea Score 4  Score 4 or greater  Results sent to PCP

## 2013-07-25 ENCOUNTER — Encounter (HOSPITAL_BASED_OUTPATIENT_CLINIC_OR_DEPARTMENT_OTHER)
Admission: RE | Disposition: A | Discharge status: Home / Self Care | Payer: Self-pay | Source: Ambulatory Visit | Attending: Orthopedic Surgery

## 2013-07-25 ENCOUNTER — Telehealth: Payer: Self-pay | Admitting: Family Medicine

## 2013-07-25 ENCOUNTER — Ambulatory Visit (HOSPITAL_BASED_OUTPATIENT_CLINIC_OR_DEPARTMENT_OTHER)
Admission: RE | Admit: 2013-07-25 | Discharge: 2013-07-25 | Disposition: A | Discharge status: Home / Self Care | Payer: Managed Care, Other (non HMO) | Source: Ambulatory Visit | Attending: Orthopedic Surgery | Admitting: Orthopedic Surgery

## 2013-07-25 ENCOUNTER — Encounter (HOSPITAL_BASED_OUTPATIENT_CLINIC_OR_DEPARTMENT_OTHER): Payer: Self-pay | Admitting: *Deleted

## 2013-07-25 ENCOUNTER — Other Ambulatory Visit: Payer: Self-pay

## 2013-07-25 ENCOUNTER — Encounter (HOSPITAL_BASED_OUTPATIENT_CLINIC_OR_DEPARTMENT_OTHER): Payer: Self-pay | Admitting: Certified Registered"

## 2013-07-25 ENCOUNTER — Ambulatory Visit (HOSPITAL_BASED_OUTPATIENT_CLINIC_OR_DEPARTMENT_OTHER): Payer: Managed Care, Other (non HMO) | Admitting: Certified Registered"

## 2013-07-25 ENCOUNTER — Other Ambulatory Visit: Payer: Self-pay | Admitting: Orthopedic Surgery

## 2013-07-25 DIAGNOSIS — K219 Gastro-esophageal reflux disease without esophagitis: Secondary | ICD-10-CM | POA: Insufficient documentation

## 2013-07-25 DIAGNOSIS — W101XXA Fall (on)(from) sidewalk curb, initial encounter: Secondary | ICD-10-CM | POA: Insufficient documentation

## 2013-07-25 DIAGNOSIS — S62309A Unspecified fracture of unspecified metacarpal bone, initial encounter for closed fracture: Secondary | ICD-10-CM | POA: Insufficient documentation

## 2013-07-25 DIAGNOSIS — I10 Essential (primary) hypertension: Secondary | ICD-10-CM | POA: Insufficient documentation

## 2013-07-25 HISTORY — DX: Gastro-esophageal reflux disease without esophagitis: K21.9

## 2013-07-25 HISTORY — DX: Other seasonal allergic rhinitis: J30.2

## 2013-07-25 HISTORY — PX: CLOSED REDUCTION FINGER WITH PERCUTANEOUS PINNING: SHX5612

## 2013-07-25 HISTORY — DX: Presence of spectacles and contact lenses: Z97.3

## 2013-07-25 SURGERY — CLOSED REDUCTION, FINGER, WITH PERCUTANEOUS PINNING
Anesthesia: General | Site: Hand | Laterality: Left | Wound class: Clean

## 2013-07-25 MED ORDER — OXYCODONE-ACETAMINOPHEN 5-325 MG PO TABS: ORAL_TABLET | ORAL | Status: DC

## 2013-07-25 MED ORDER — OXYCODONE HCL 5 MG PO TABS
5.0000 mg | ORAL_TABLET | Freq: Once | ORAL | Status: AC | PRN
  Administered 2013-07-25: 5 mg via ORAL

## 2013-07-25 MED ORDER — OXYCODONE HCL 5 MG/5ML PO SOLN: 5.0000 mg | Freq: Once | ORAL | Status: AC | PRN

## 2013-07-25 MED ORDER — PROPOFOL 10 MG/ML IV BOLUS
INTRAVENOUS | Status: DC | PRN
  Administered 2013-07-25: 200 mg via INTRAVENOUS

## 2013-07-25 MED ORDER — DEXAMETHASONE SODIUM PHOSPHATE 10 MG/ML IJ SOLN
INTRAMUSCULAR | Status: DC | PRN
  Administered 2013-07-25: 10 mg via INTRAVENOUS

## 2013-07-25 MED ORDER — BUPIVACAINE HCL (PF) 0.25 % IJ SOLN
INTRAMUSCULAR | Status: DC | PRN
  Administered 2013-07-25: 7 mL

## 2013-07-25 MED ORDER — MIDAZOLAM HCL 5 MG/5ML IJ SOLN
INTRAMUSCULAR | Status: DC | PRN
  Administered 2013-07-25: 2 mg via INTRAVENOUS

## 2013-07-25 MED ORDER — CEFAZOLIN SODIUM-DEXTROSE 2-3 GM-% IV SOLR
2.0000 g | INTRAVENOUS | Status: AC
  Administered 2013-07-25: 2 g via INTRAVENOUS

## 2013-07-25 MED ORDER — FENTANYL CITRATE 0.05 MG/ML IJ SOLN: 50.0000 ug | INTRAMUSCULAR | Status: DC | PRN

## 2013-07-25 MED ORDER — ONDANSETRON HCL 4 MG/2ML IJ SOLN
INTRAMUSCULAR | Status: DC | PRN
  Administered 2013-07-25: 4 mg via INTRAVENOUS

## 2013-07-25 MED ORDER — CHLORHEXIDINE GLUCONATE 4 % EX LIQD: 60.0000 mL | Freq: Once | CUTANEOUS | Status: DC

## 2013-07-25 MED ORDER — FENTANYL CITRATE 0.05 MG/ML IJ SOLN
INTRAMUSCULAR | Status: DC | PRN
  Administered 2013-07-25: 100 ug via INTRAVENOUS

## 2013-07-25 MED ORDER — HYDROMORPHONE HCL PF 1 MG/ML IJ SOLN
0.2500 mg | INTRAMUSCULAR | Status: DC | PRN
  Administered 2013-07-25: 0.5 mg via INTRAVENOUS

## 2013-07-25 MED ORDER — MIDAZOLAM HCL 2 MG/2ML IJ SOLN: 1.0000 mg | INTRAMUSCULAR | Status: DC | PRN

## 2013-07-25 MED ORDER — LIDOCAINE HCL (CARDIAC) 20 MG/ML IV SOLN
INTRAVENOUS | Status: DC | PRN
  Administered 2013-07-25: 75 mg via INTRAVENOUS

## 2013-07-25 MED ORDER — LACTATED RINGERS IV SOLN
INTRAVENOUS | Status: DC
  Administered 2013-07-25: 11:00:00 via INTRAVENOUS

## 2013-07-25 MED ORDER — ONDANSETRON HCL 4 MG/2ML IJ SOLN: 4.0000 mg | Freq: Once | INTRAMUSCULAR | Status: DC | PRN

## 2013-07-25 SURGICAL SUPPLY — 56 items
BANDAGE ELASTIC 3 VELCRO ST LF (GAUZE/BANDAGES/DRESSINGS) ×2 IMPLANT
BANDAGE GAUZE ELAST BULKY 4 IN (GAUZE/BANDAGES/DRESSINGS) ×2 IMPLANT
BLADE MINI RND TIP GREEN BEAV (BLADE) IMPLANT
BLADE SURG 15 STRL LF DISP TIS (BLADE) ×1 IMPLANT
BLADE SURG 15 STRL SS (BLADE) ×2
BNDG ELASTIC 2 VLCR STRL LF (GAUZE/BANDAGES/DRESSINGS) IMPLANT
BNDG ESMARK 4X9 LF (GAUZE/BANDAGES/DRESSINGS) ×2 IMPLANT
CHLORAPREP W/TINT 26ML (MISCELLANEOUS) ×4 IMPLANT
CLOTH BEACON ORANGE TIMEOUT ST (SAFETY) IMPLANT
CORDS BIPOLAR (ELECTRODE) IMPLANT
COVER MAYO STAND STRL (DRAPES) ×2 IMPLANT
COVER TABLE BACK 60X90 (DRAPES) ×2 IMPLANT
CUFF TOURNIQUET SINGLE 18IN (TOURNIQUET CUFF) ×2 IMPLANT
DRAPE EXTREMITY T 121X128X90 (DRAPE) ×2 IMPLANT
DRAPE OEC MINIVIEW 54X84 (DRAPES) ×2 IMPLANT
DRAPE SURG 17X23 STRL (DRAPES) ×2 IMPLANT
GAUZE XEROFORM 1X8 LF (GAUZE/BANDAGES/DRESSINGS) ×2 IMPLANT
GLOVE BIO SURGEON STRL SZ7.5 (GLOVE) ×2 IMPLANT
GLOVE BIOGEL PI IND STRL 7.0 (GLOVE) ×1 IMPLANT
GLOVE BIOGEL PI IND STRL 8 (GLOVE) ×1 IMPLANT
GLOVE BIOGEL PI IND STRL 8.5 (GLOVE) ×1 IMPLANT
GLOVE BIOGEL PI INDICATOR 7.0 (GLOVE) ×1
GLOVE BIOGEL PI INDICATOR 8 (GLOVE) ×1
GLOVE BIOGEL PI INDICATOR 8.5 (GLOVE) ×1
GLOVE ECLIPSE 6.5 STRL STRAW (GLOVE) ×2 IMPLANT
GLOVE EXAM NITRILE LRG STRL (GLOVE) ×2 IMPLANT
GLOVE SURG ORTHO 8.0 STRL STRW (GLOVE) ×4 IMPLANT
GOWN BRE IMP PREV XXLGXLNG (GOWN DISPOSABLE) ×4 IMPLANT
GOWN PREVENTION PLUS XLARGE (GOWN DISPOSABLE) ×2 IMPLANT
GOWN PREVENTION PLUS XXLARGE (GOWN DISPOSABLE) ×4 IMPLANT
K-WIRE .045X4 (WIRE) ×4 IMPLANT
NEEDLE HYPO 22GX1.5 SAFETY (NEEDLE) IMPLANT
NEEDLE HYPO 25X1 1.5 SAFETY (NEEDLE) ×2 IMPLANT
NS IRRIG 1000ML POUR BTL (IV SOLUTION) IMPLANT
PACK BASIN DAY SURGERY FS (CUSTOM PROCEDURE TRAY) ×2 IMPLANT
PAD CAST 3X4 CTTN HI CHSV (CAST SUPPLIES) ×1 IMPLANT
PAD CAST 4YDX4 CTTN HI CHSV (CAST SUPPLIES) IMPLANT
PADDING CAST ABS 4INX4YD NS (CAST SUPPLIES)
PADDING CAST ABS COTTON 4X4 ST (CAST SUPPLIES) IMPLANT
PADDING CAST COTTON 3X4 STRL (CAST SUPPLIES) ×1
PADDING CAST COTTON 4X4 STRL (CAST SUPPLIES)
SLEEVE SCD COMPRESS KNEE MED (MISCELLANEOUS) ×2 IMPLANT
SPLINT PLASTER CAST XFAST 4X15 (CAST SUPPLIES) ×20 IMPLANT
SPLINT PLASTER XTRA FAST SET 4 (CAST SUPPLIES) ×20
SPONGE GAUZE 4X4 12PLY (GAUZE/BANDAGES/DRESSINGS) ×2 IMPLANT
STOCKINETTE 4X48 STRL (DRAPES) ×2 IMPLANT
SUT ETHILON 3 0 PS 1 (SUTURE) IMPLANT
SUT ETHILON 4 0 PS 2 18 (SUTURE) IMPLANT
SUT MERSILENE 4 0 P 3 (SUTURE) IMPLANT
SUT VIC AB 3-0 PS1 18 (SUTURE)
SUT VIC AB 3-0 PS1 18XBRD (SUTURE) IMPLANT
SUT VICRYL 4-0 PS2 18IN ABS (SUTURE) IMPLANT
SYR BULB 3OZ (MISCELLANEOUS) IMPLANT
SYR CONTROL 10ML LL (SYRINGE) ×2 IMPLANT
TOWEL OR 17X24 6PK STRL BLUE (TOWEL DISPOSABLE) ×2 IMPLANT
UNDERPAD 30X30 INCONTINENT (UNDERPADS AND DIAPERS) ×2 IMPLANT

## 2013-07-25 NOTE — Transfer of Care (Signed)
Immediate Anesthesia Transfer of Care Note  Patient: Kendra Thompson  Procedure(s) Performed: Procedure(s): CLOSED REDUCTION FINGER WITH PERCUTANEOUS PINNING LEFT SMALL METACARPAL (Left)  Patient Location: PACU  Anesthesia Type:General  Level of Consciousness: awake, alert  and oriented  Airway & Oxygen Therapy: Patient Spontanous Breathing and Patient connected to face mask oxygen  Post-op Assessment: Report given to PACU RN and Post -op Vital signs reviewed and stable  Post vital signs: Reviewed and stable  Complications: No apparent anesthesia complications

## 2013-07-25 NOTE — Brief Op Note (Signed)
07/25/2013  12:16 PM  PATIENT:  Kendra Thompson  62 y.o. female  PRE-OPERATIVE DIAGNOSIS:  LEFT SMALL METACARPAL BASE FRACTURE  POST-OPERATIVE DIAGNOSIS:  LEFT SMALL METACARPAL BASE FRACTURE  PROCEDURE:  Procedure(s): CLOSED REDUCTION FINGER WITH PERCUTANEOUS PINNING LEFT SMALL METACARPAL  SURGEON:  Surgeon(s): Tami Ribas, MD Nicki Reaper, MD  PHYSICIAN ASSISTANT:   ASSISTANTS: Cindee Salt, MD   ANESTHESIA:   general  EBL:     DRAINS: none   LOCAL MEDICATIONS USED:  MARCAINE     SPECIMEN:  No Specimen  DISPOSITION OF SPECIMEN:  N/A  COUNTS:  YES  TOURNIQUET:   Total Tourniquet Time Documented: Upper Arm (Left) - 14 minutes Total: Upper Arm (Left) - 14 minutes   DICTATION: .Other Dictation: Dictation Number [id number]  PLAN OF CARE: Discharge to home after PACU

## 2013-07-25 NOTE — Anesthesia Preprocedure Evaluation (Signed)
Anesthesia Evaluation  Patient identified by MRN, date of birth, ID band Patient awake    Reviewed: Allergy & Precautions, H&P , NPO status , Patient's Chart, lab work & pertinent test results  Airway Mallampati: I      Dental  (+) Teeth Intact and Dental Advisory Given   Pulmonary  breath sounds clear to auscultation        Cardiovascular hypertension, Pt. on medications Rhythm:Regular Rate:Normal     Neuro/Psych    GI/Hepatic GERD-  Medicated and Controlled,  Endo/Other  Morbid obesity  Renal/GU      Musculoskeletal   Abdominal   Peds  Hematology   Anesthesia Other Findings   Reproductive/Obstetrics                           Anesthesia Physical Anesthesia Plan  ASA: II  Anesthesia Plan: General   Post-op Pain Management:    Induction: Intravenous  Airway Management Planned: LMA  Additional Equipment:   Intra-op Plan:   Post-operative Plan: Extubation in OR  Informed Consent: I have reviewed the patients History and Physical, chart, labs and discussed the procedure including the risks, benefits and alternatives for the proposed anesthesia with the patient or authorized representative who has indicated his/her understanding and acceptance.   Dental advisory given  Plan Discussed with: CRNA, Anesthesiologist and Surgeon  Anesthesia Plan Comments:         Anesthesia Quick Evaluation

## 2013-07-25 NOTE — Telephone Encounter (Signed)
Husband wanted to let you know that Kendra Thompson fell on Saturday and they went to UC.  Her hand is broken and she is having [facility] now at [facility].

## 2013-07-25 NOTE — H&P (Signed)
Kendra Thompson is an 62 y.o. female.   Chief Complaint: left hand fracture HPI: 62 yo rhd female fell from curb 07/22/13 injuring left hand.  Seen at [facility] where XR revealed left small finger metacarpal fracture.  Splinted and followed up in office.  Reports no previous injury to left hand.  Past Medical History  Diagnosis Date  . Hypertension   . GERD (gastroesophageal reflux disease)   . Seasonal allergies   . Wears glasses     Past Surgical History  Procedure Laterality Date  . Colonoscopy    . Dilation and curettage of uterus    . Diagnostic laparoscopy  1992    explor-    History reviewed. No pertinent family history. Social History:  reports that she has never smoked. She has never used smokeless tobacco. She reports that she does not drink alcohol or use illicit drugs.  Allergies: No Known Allergies  Medications Prior to Admission  Medication Sig Dispense Refill  . amLODipine (NORVASC) 10 MG tablet Take 1 tablet (10 mg total) by mouth daily.  90 tablet  3  . aspirin 81 MG EC tablet Take 81 mg by mouth daily.        Marland Kitchen esomeprazole (NEXIUM) 40 MG capsule Take 1 capsule (40 mg total) by mouth daily.  90 capsule  3  . ibuprofen (ADVIL,MOTRIN) 400 MG tablet Take 400 mg by mouth every 6 (six) hours as needed for pain.      Marland Kitchen losartan-hydrochlorothiazide (HYZAAR) 100-25 MG per tablet Take 1 tablet by mouth daily.  90 tablet  3  . traMADol (ULTRAM) 50 MG tablet Take 1 tablet (50 mg total) by mouth every 6 (six) hours as needed for pain.  15 tablet  0  . albuterol (PROVENTIL HFA) 108 (90 BASE) MCG/ACT inhaler Inhale 2 puffs into the lungs every 6 (six) hours as needed. asthma  18 g  12  . fluticasone (FLONASE) 50 MCG/ACT nasal spray Place 1-2 sprays into the nose daily. Each nostril  16 g  12    No results found for this or any previous visit (from the past 48 hour(s)).  No results found.   A comprehensive review of systems was negative except for: Eyes: positive for  contacts/glasses  Blood pressure 135/64, pulse 84, temperature 98.4 F (36.9 C), temperature source Oral, resp. rate 20, height 5\' 8"  (1.727 m), weight 244 lb 2 oz (110.734 kg), SpO2 96.00%.  General appearance: alert, cooperative and appears stated age Head: Normocephalic, without obvious abnormality, atraumatic Neck: supple, symmetrical, trachea midline Resp: clear to auscultation bilaterally Cardio: regular rate and rhythm GI: non tender Extremities: intact sensation and capillary refill all digits.  +epl/fpl/io.  ttp small finger metacarpal base.  +ecchymosis.  no wounds. Pulses: 2+ and symmetric Skin: Skin color, texture, turgor normal. No rashes or lesions Neurologic: Grossly normal Incision/Wound: na  Assessment/Plan Left small finger metacarpal base fracture.  Non operative and operative treatment options were discussed with the patient and patient wishes to proceed with operative treatment. Risks, benefits, and alternatives of surgery were discussed and the patient agrees with the plan of care.   Aedon Deason R 07/25/2013, 11:15 AM

## 2013-07-25 NOTE — Anesthesia Postprocedure Evaluation (Signed)
  Anesthesia Post-op Note  Patient: Kendra Thompson  Procedure(s) Performed: Procedure(s): CLOSED REDUCTION FINGER WITH PERCUTANEOUS PINNING LEFT SMALL METACARPAL (Left)  Patient Location: PACU  Anesthesia Type:General  Level of Consciousness: awake, alert  and oriented  Airway and Oxygen Therapy: Patient Spontanous Breathing  Post-op Pain: mild  Post-op Assessment: Post-op Vital signs reviewed  Post-op Vital Signs: Reviewed  Complications: No apparent anesthesia complications

## 2013-07-25 NOTE — Op Note (Signed)
[id number] 

## 2013-07-26 ENCOUNTER — Encounter (HOSPITAL_BASED_OUTPATIENT_CLINIC_OR_DEPARTMENT_OTHER): Payer: Self-pay | Admitting: Orthopedic Surgery

## 2013-07-26 NOTE — Op Note (Signed)
NAMESANAAI, DOANE            ACCOUNT NO.:  [id number]  MEDICAL RECORD NO.:  [id number]  LOCATION:                                 FACILITY:  PHYSICIAN:  Kendra Loa, MD        DATE OF BIRTH:  29-Apr-1951  DATE OF PROCEDURE:  07/25/2013 DATE OF DISCHARGE:                              OPERATIVE REPORT   PREOPERATIVE DIAGNOSIS:  Left small finger metacarpal base fracture.  POSTOPERATIVE DIAGNOSIS:  Left small finger metacarpal base fracture.  PROCEDURE:  Closed reduction and percutaneous pinning, left small finger metacarpal base fracture.  SURGEON:  Kendra Loa, MD  ASSISTANT:  Cindee Salt, MD  ANESTHESIA:  General.  IV FLUIDS:  Per anesthesia flow sheet.  ESTIMATED BLOOD LOSS:  Minimal.  COMPLICATIONS:  None.  SPECIMENS:  None.  TOURNIQUET TIME:  14 minutes.  DISPOSITION:  Stable to PACU.  INDICATIONS:  Kendra Thompson is a 62 year old right-hand dominant female who fell from a curve 3 days ago injuring her left hand.  She was seen at the urgent care facility where radiographs were taken revealing a small finger metacarpal base fracture.  She was referred to me in followup in the office.  We discussed nonoperative and operative treatment options.  She wished to proceed with operative fixation. Risks, benefits, and alternatives of surgery were discussed including risk of blood loss, infection, damage to nerves, vessels, tendons, ligaments, bone, failure of surgery, need for additional surgery, complications with wound healing, continued pain, nonunion, malunion, stiffness.  She voiced understanding of these risks and elected to proceed.  OPERATIVE COURSE:  After being identified preoperatively by myself, the patient and I agreed upon procedure and site of procedure.  Surgical site was marked.  The risks, benefits, and alternatives of surgery were reviewed and she wished to proceed.  Surgical consent had been signed. She was given IV Ancef as preoperative  antibiotic prophylaxis.  She was transported to the operating room and placed on the operating room table in supine position with left upper extremity on arm board.  General anesthesia was induced by anesthesiologist.  Left upper extremity was prepped and draped in normal sterile orthopedic fashion.  Surgical pause was performed between surgeons, anesthesia, and operating room staff, and all were in agreement as to the patient, procedure, and site of procedure.  Tourniquet at the proximal aspect of the extremity was inflated to 250 mmHg after exsanguination of the limb with an Esmarch bandage.  C-arm was used in AP, lateral, and oblique projections to aid in reduction.  A closed reduction was performed.  Three 0.035-inch K- wires were then used to stabilize the fracture.  Two coursed from ulnar to radial through the small finger metacarpal into the ring finger metacarpal and 1 coursed across the fracture site into the hamate.  This was adequate to stabilize the fracture.  All pins were bent and cut short.  The pin sites were dressed with sterile Xeroform and 4x4s and wrapped with a Kerlix bandage.  A volar and dorsal slab splint including the long, ring, and small fingers was placed with the MPs flexed and the IPs extended.  This was wrapped with Kerlix and Ace bandage.  Tourniquet was  deflated at 14 minutes.  Fingertips were pink with brisk capillary refill after deflation of tourniquet.  The operative drapes were broken down.  The patient was awoken from anesthesia safely.  She was transferred back to stretcher and taken to PACU in stable condition.  I will see her back in the office in 1 week for postoperative followup.  A 0.25% plain Marcaine was injected to aid in postoperative analgesia.  I will give her Percocet 5/325, 1-2 p.o. q.6 hours p.r.n. pain, dispensed #30.     Kendra Loa, MD     KK/MEDQ  D:  07/25/2013  T:  07/26/2013  Job:  [id number]

## 2013-07-27 LAB — POCT I-STAT, CHEM 8
Chloride: 102 mEq/L (ref 96–112)
Glucose, Bld: 120 mg/dL — ABNORMAL HIGH (ref 70–99)
HCT: 45 % (ref 36.0–46.0)
Hemoglobin: 15.3 g/dL — ABNORMAL HIGH (ref 12.0–15.0)
Potassium: 3.5 mEq/L (ref 3.5–5.1)
Sodium: 138 mEq/L (ref 135–145)
TCO2: 27 mmol/L (ref 0–100)

## 2013-07-27 NOTE — Telephone Encounter (Signed)
Dear [name] Team plz call and see how sheis doing? Let me know if anything I can do\ Tell her I saw the films THANKS! Denny Levy

## 2013-07-27 NOTE — Telephone Encounter (Signed)
Patient had surgery on left hand on Tuesday she had 3 pins placed. She will follow up with Dr. Merlyn Lot on Wednesday and she will keep [place] updated on this. Right now at the moment she is no needing anything from [place]. She was very appreciative that we called and checked up on her

## 2013-08-08 ENCOUNTER — Encounter: Payer: Self-pay | Admitting: Family Medicine

## 2013-08-31 ENCOUNTER — Other Ambulatory Visit: Payer: Self-pay

## 2013-09-24 ENCOUNTER — Other Ambulatory Visit: Payer: Self-pay | Admitting: Family Medicine

## 2013-10-24 ENCOUNTER — Encounter: Payer: Self-pay | Admitting: Family Medicine

## 2013-11-08 ENCOUNTER — Other Ambulatory Visit: Payer: Self-pay | Admitting: Family Medicine

## 2013-12-15 ENCOUNTER — Encounter: Payer: Self-pay | Admitting: Family Medicine

## 2013-12-15 ENCOUNTER — Ambulatory Visit (INDEPENDENT_AMBULATORY_CARE_PROVIDER_SITE_OTHER): Payer: Managed Care, Other (non HMO) | Admitting: Family Medicine

## 2013-12-15 VITALS — BP 136/85 | HR 89 | Ht 68.0 in | Wt 244.0 lb

## 2013-12-15 DIAGNOSIS — I10 Essential (primary) hypertension: Secondary | ICD-10-CM

## 2013-12-15 DIAGNOSIS — G5620 Lesion of ulnar nerve, unspecified upper limb: Secondary | ICD-10-CM

## 2013-12-15 MED ORDER — PROPRANOLOL HCL ER 80 MG PO CP24: 80.0000 mg | ORAL_CAPSULE | Freq: Every day | ORAL | Status: DC

## 2013-12-15 NOTE — Assessment & Plan Note (Signed)
Her last she's already on calcium channel blocker, diuretic ARB combo. Still has a little REM for premed actually in blood pressure permits I think we are safe adding low-dose Inderal. As a child she had some asthma so we discussed warning signs of increased wheezing shortness of breath and immediate cessation of the medicine she does occur. If symptoms were really bad she would need to go immediately to the ED.

## 2013-12-15 NOTE — Progress Notes (Signed)
   Subjective:    Patient ID: Kendra Thompson, female    DOB: 08/09/1951, 63 y.o.   MRN: [id number]  HPI Burning type pain left palm ulnar side. 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 She had ORIF and was in a splint for O. She first noticed the hyperesthesia type symptoms 3 or 4 weeks after being in a splint. Has never gotten better. She does have significantly improved wrist motion and use   Review of Systems     Objective:   Physical Exam  Vital signs are reviewed GENERAL: Well-developed overweight female no acute distress  WRIST: Left. Well-healed scar on her lateral hand.. she has good flexion and extension of the wrist but not full compared with her right hand. She can bear weight on outstretched hand without significant pain. Normal strength fingers. NEURO:. The area on her palm starts at the wrist crease and extends the length of the metacarpal. Decreased soft touch sensation with hyperesthesia here. The rest of the neuro exam of the hand is totally normal.   ULTRASOUND: Left wrist. Comparison was made with the right wrist. On the left her ulnar artery as it passes close to the use of hormone appears to have a small constriction but still has good flow. Within the arterial flow there is a faint the area where the flow is not forward. This could be beginning of his aneurysm but I'm not sure. It's hard to identify the ulnar nerve in diameter now on either side. There does seem to be some increased soft tissue density in the left wrist but no obvious ganglion. Flexor tendons intact the    Assessment & Plan:

## 2013-12-15 NOTE — Assessment & Plan Note (Signed)
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 [place] We discussed options. Will try Inderal (treating as if RSD picture) for 2 weeks and she will call. If improving, will continue,if not consider add or change to nitro patch (I would use 1/8 rather than 1/4). [place] today a little concerning for  scar tissue with nerve impingement (and possible arterial impingement vs small aneurysm although this is a guess). Could ultimately consider MRI--not sure how hardware would affect resolution. Last resort CSI.Marland Kitchen We discussed in depth--she also has option to return to her hand surgeon--she wold rather pursue a much more conservative approach and I think that is reasonable at this time. I see no evidence of current ganglion cyst

## 2014-01-10 ENCOUNTER — Encounter: Payer: Managed Care, Other (non HMO) | Admitting: Family Medicine

## 2014-01-10 ENCOUNTER — Encounter: Payer: Self-pay | Admitting: Family Medicine

## 2014-01-10 ENCOUNTER — Ambulatory Visit (INDEPENDENT_AMBULATORY_CARE_PROVIDER_SITE_OTHER): Payer: Managed Care, Other (non HMO) | Admitting: Family Medicine

## 2014-01-10 VITALS — BP 130/70 | Temp 98.1°F | Ht 68.0 in | Wt 256.6 lb

## 2014-01-10 DIAGNOSIS — I10 Essential (primary) hypertension: Secondary | ICD-10-CM

## 2014-01-10 DIAGNOSIS — J45909 Unspecified asthma, uncomplicated: Secondary | ICD-10-CM

## 2014-01-10 DIAGNOSIS — R7309 Other abnormal glucose: Secondary | ICD-10-CM

## 2014-01-10 DIAGNOSIS — R1013 Epigastric pain: Secondary | ICD-10-CM

## 2014-01-10 DIAGNOSIS — G5620 Lesion of ulnar nerve, unspecified upper limb: Secondary | ICD-10-CM

## 2014-01-10 LAB — COMPREHENSIVE METABOLIC PANEL
ALK PHOS: 91 U/L (ref 39–117)
ALT: 27 U/L (ref 0–35)
AST: 17 U/L (ref 0–37)
Albumin: 4.1 g/dL (ref 3.5–5.2)
BUN: 22 mg/dL (ref 6–23)
CO2: 32 mEq/L (ref 19–32)
Calcium: 9.8 mg/dL (ref 8.4–10.5)
Chloride: 98 mEq/L (ref 96–112)
Creat: 0.77 mg/dL (ref 0.50–1.10)
Glucose, Bld: 107 mg/dL — ABNORMAL HIGH (ref 70–99)
Potassium: 3.6 mEq/L (ref 3.5–5.3)
SODIUM: 139 meq/L (ref 135–145)
TOTAL PROTEIN: 7.2 g/dL (ref 6.0–8.3)
Total Bilirubin: 0.5 mg/dL (ref 0.2–1.2)

## 2014-01-10 LAB — LDL CHOLESTEROL, DIRECT: Direct LDL: 126 mg/dL — ABNORMAL HIGH

## 2014-01-10 LAB — POCT GLYCOSYLATED HEMOGLOBIN (HGB A1C): Hemoglobin A1C: 6

## 2014-01-10 MED ORDER — ESOMEPRAZOLE MAGNESIUM 40 MG PO CPDR: 40.0000 mg | DELAYED_RELEASE_CAPSULE | Freq: Every day | ORAL | Status: DC

## 2014-01-12 ENCOUNTER — Encounter: Payer: Self-pay | Admitting: Family Medicine

## 2014-01-12 NOTE — Assessment & Plan Note (Signed)
Improve complex regional pain symptoms with the addition of Inderal LA. Will double her dose for a few days giving her one dose in the afternoon. If she tolerates this we'll continue forward with that for the next 2-3 months. She gets lightheaded she'll call me and we'll change the dosing schedule around a little bit of thick her blood pressure deftly handle this.

## 2014-01-12 NOTE — Progress Notes (Signed)
   Subjective:    Patient ID: Kendra Thompson, female    DOB: July 07, 1951, 63 y.o.   MRN: [id number]  HPI  Here for CPE. Does not need Pap smear she has previously been going to a gynecologist but now recalls it's been several years since her last Pap. She's doing pretty well. We started her on some Inderal for the pain in her wrist which we think is related to complex regional pain syndrome and she's actually had significant improvement in her night pain. She still having pain starting about 2:00 in the afternoon.  Review of Systems  Constitutional: Negative for fever, appetite change, fatigue and unexpected weight change.  HENT: Negative for trouble swallowing.   Eyes: Negative for photophobia, pain and visual disturbance.  Respiratory: Negative for cough and shortness of breath.   Cardiovascular: Negative for chest pain, palpitations and leg swelling.  Genitourinary: Negative for urgency, vaginal discharge and pelvic pain.  Musculoskeletal: Positive for arthralgias.  Neurological: Negative for headaches.  Psychiatric/Behavioral: Negative for dysphoric mood and decreased concentration. The patient is not nervous/anxious.        Objective:   Physical Exam  Constitutional: She is oriented to person, place, and time. She appears well-developed and well-nourished.  HENT:  Head: Normocephalic and atraumatic.  Right Ear: External ear normal.  Left Ear: External ear normal.  Nose: Nose normal.  Mouth/Throat: Oropharynx is clear and moist.  Eyes: Conjunctivae and EOM are normal. Pupils are equal, round, and reactive to light.  Neck: Normal range of motion. Neck supple. No JVD present. No thyromegaly present.  Cardiovascular: Normal rate, regular rhythm, normal heart sounds and intact distal pulses.   Pulmonary/Chest: Effort normal and breath sounds normal. She has no wheezes.  Abdominal: Soft. Bowel sounds are normal.  Musculoskeletal: Normal range of motion.  Lymphadenopathy:    She  has no cervical adenopathy.  Neurological: She is alert and oriented to person, place, and time. She has normal reflexes. No cranial nerve deficit. Coordination normal.  Psychiatric: She has a normal mood and affect. Her behavior is normal. Judgment normal.          Assessment & Plan:

## 2014-01-12 NOTE — Assessment & Plan Note (Signed)
She's not having any specific problems with this right now.

## 2014-01-12 NOTE — Assessment & Plan Note (Signed)
Gait control. We'll continue current medication regimen to

## 2014-01-12 NOTE — Assessment & Plan Note (Signed)
She would like to lose some weight. We will check a hemoglobin A1c today.

## 2014-02-03 ENCOUNTER — Encounter (HOSPITAL_COMMUNITY): Payer: Self-pay | Admitting: Emergency Medicine

## 2014-02-03 ENCOUNTER — Emergency Department (HOSPITAL_COMMUNITY)
Admission: EM | Admit: 2014-02-03 | Discharge: 2014-02-03 | Disposition: A | Discharge status: Home / Self Care | Payer: Managed Care, Other (non HMO) | Source: Home / Self Care | Attending: Family Medicine | Admitting: Family Medicine

## 2014-02-03 DIAGNOSIS — R300 Dysuria: Secondary | ICD-10-CM

## 2014-02-03 DIAGNOSIS — R350 Frequency of micturition: Secondary | ICD-10-CM

## 2014-02-03 LAB — GLUCOSE, CAPILLARY: GLUCOSE-CAPILLARY: 91 mg/dL (ref 70–99)

## 2014-02-03 LAB — POCT URINALYSIS DIP (DEVICE)
Bilirubin Urine: NEGATIVE
Glucose, UA: NEGATIVE mg/dL
Ketones, ur: NEGATIVE mg/dL
Leukocytes, UA: NEGATIVE
Nitrite: NEGATIVE
Protein, ur: NEGATIVE mg/dL
SPECIFIC GRAVITY, URINE: 1.025 (ref 1.005–1.030)
UROBILINOGEN UA: 0.2 mg/dL (ref 0.0–1.0)
pH: 6 (ref 5.0–8.0)

## 2014-02-03 MED ORDER — CEFUROXIME AXETIL 250 MG PO TABS: 250.0000 mg | ORAL_TABLET | Freq: Two times a day (BID) | ORAL | Status: DC

## 2014-02-03 NOTE — Discharge Instructions (Signed)

## 2014-02-03 NOTE — ED Provider Notes (Signed)
CSN: [id number]     Arrival date & time 02/03/14  3299 History   None    Chief Complaint  Patient presents with  . Urinary Tract Infection   (Consider location/radiation/quality/duration/timing/severity/associated sxs/prior Treatment) HPI Comments: 40f presents c/o urinary frequency, burning, lower abdominal pain for 2 days.  This was getting gradually worse until yesterday, started taking AZO and lots of fluids, seemed to get better yesterday but bad again this AM.  Denies any other sxs.  No risk for STDs.  No vaginal discharge, fever, chills, NVD, flank pain.  Has had some loose stools.  No significant PMH.    Patient is a 63 y.o. female presenting with urinary tract infection.  Urinary Tract Infection Pertinent negatives include no abdominal pain.    Past Medical History  Diagnosis Date  . Hypertension   . GERD (gastroesophageal reflux disease)   . Seasonal allergies   . Wears glasses    Past Surgical History  Procedure Laterality Date  . Colonoscopy    . Dilation and curettage of uterus    . Diagnostic laparoscopy  1992    explor-  . Closed reduction finger with percutaneous pinning Left 07/25/2013    Procedure: CLOSED REDUCTION FINGER WITH PERCUTANEOUS PINNING LEFT SMALL METACARPAL;  Surgeon: Tennis Must, MD;  Location: [facility];  Service: Orthopedics;  Laterality: Left;   History reviewed. No pertinent family history. History  Substance Use Topics  . Smoking status: Never Smoker   . Smokeless tobacco: Never Used  . Alcohol Use: No   OB History   Grav Para Term Preterm Abortions TAB SAB Ect Mult Living                 Review of Systems  Constitutional: Negative for fever and chills.  Gastrointestinal: Negative for abdominal pain.  Genitourinary: Positive for dysuria, urgency and frequency. Negative for hematuria, flank pain, vaginal discharge, genital sores and pelvic pain.  All other systems reviewed and are negative.   Allergies   Tape  Home Medications   Current Outpatient Rx  Name  Route  Sig  Dispense  Refill  . albuterol (PROVENTIL HFA) 108 (90 BASE) MCG/ACT inhaler   Inhalation   Inhale 2 puffs into the lungs every 6 (six) hours as needed. asthma   18 g   12   . amLODipine (NORVASC) 10 MG tablet      TAKE 1 TABLET EVERY DAY   90 tablet   3   . esomeprazole (NEXIUM) 40 MG capsule   Oral   Take 1 capsule (40 mg total) by mouth daily.   90 capsule   3   . ibuprofen (ADVIL,MOTRIN) 400 MG tablet   Oral   Take 400 mg by mouth every 6 (six) hours as needed for pain.         Marland Kitchen losartan-hydrochlorothiazide (HYZAAR) 100-25 MG per tablet      TAKE 1 TABLET EVERY DAY   90 tablet   3   . propranolol ER (INDERAL LA) 80 MG 24 hr capsule   Oral   Take 1 capsule (80 mg total) by mouth daily.   90 capsule   3   . cefUROXime (CEFTIN) 250 MG tablet   Oral   Take 1 tablet (250 mg total) by mouth 2 (two) times daily with a meal.   10 tablet   0    BP 154/59  Pulse 73  Temp(Src) 98 F (36.7 C) (Oral)  Resp 14  SpO2 98%  Physical Exam  Nursing note and vitals reviewed. Constitutional: She is oriented to person, place, and time. Vital signs are normal. She appears well-developed and well-nourished. No distress.  HENT:  Head: Normocephalic and atraumatic.  Pulmonary/Chest: Effort normal. No respiratory distress.  Abdominal: Soft. Bowel sounds are normal. She exhibits no mass. There is tenderness in the suprapubic area. There is no rigidity, no rebound, no guarding, no CVA tenderness, no tenderness at McBurney's point and negative Murphy's sign.  Neurological: She is alert and oriented to person, place, and time. She has normal strength. Coordination normal.  Skin: Skin is warm and dry. No rash noted. She is not diaphoretic.  Psychiatric: She has a normal mood and affect. Judgment normal.    ED Course  Procedures (including critical care time) Labs Review Labs Reviewed  POCT URINALYSIS DIP  (DEVICE) - Abnormal; Notable for the following:    Hgb urine dipstick TRACE (*)    All other components within normal limits  URINE CULTURE  GLUCOSE, CAPILLARY   Imaging Review No results found.   MDM   1. Urinary frequency   2. Dysuria    Symptoms consistent with UTI.  UA normal but she has been on AZO, could make UA results unreliable.  Culture sent.  CBG 91  Meds ordered this encounter  Medications  . cefUROXime (CEFTIN) 250 MG tablet    Sig: Take 1 tablet (250 mg total) by mouth 2 (two) times daily with a meal.    Dispense:  10 tablet    Refill:  0    Order Specific Question:  Supervising Provider    Answer:  Lynne Leader, [place]       Liam Graham, PA-C 02/03/14 1053

## 2014-02-03 NOTE — ED Notes (Signed)
C/o discomfort w UA since Thursday PM, no relief yesterday w home treatment

## 2014-02-05 LAB — URINE CULTURE: Colony Count: >=100000

## 2014-02-05 NOTE — ED Notes (Signed)
Urine culture: >100,000 colonies E. Coli. Pt. adequately treated with Ceftin. Kendra Thompson [facility] 02/05/2014

## 2014-02-06 NOTE — ED Provider Notes (Signed)
Medical screening examination/treatment/procedure(s) were performed by a resident physician or non-physician practitioner and as the supervising physician I was immediately available for consultation/collaboration.  Lynne Leader, MD    Gregor Hams, MD 02/06/14 [phone]

## 2014-08-06 ENCOUNTER — Other Ambulatory Visit: Payer: Self-pay | Admitting: *Deleted

## 2014-08-06 MED ORDER — AMLODIPINE BESYLATE 10 MG PO TABS: ORAL_TABLET | ORAL | Status: DC

## 2014-08-20 ENCOUNTER — Other Ambulatory Visit: Payer: Self-pay | Admitting: *Deleted

## 2014-08-21 ENCOUNTER — Other Ambulatory Visit: Payer: Self-pay | Admitting: *Deleted

## 2014-08-21 MED ORDER — LOSARTAN POTASSIUM-HCTZ 100-25 MG PO TABS
ORAL_TABLET | ORAL | Status: DC
Start: 2014-08-21 — End: 2015-07-31

## 2014-08-21 MED ORDER — LOSARTAN POTASSIUM-HCTZ 100-25 MG PO TABS: ORAL_TABLET | ORAL | Status: DC

## 2014-09-07 ENCOUNTER — Ambulatory Visit (INDEPENDENT_AMBULATORY_CARE_PROVIDER_SITE_OTHER): Payer: 59 | Admitting: Family Medicine

## 2014-09-07 ENCOUNTER — Ambulatory Visit
Admission: RE | Admit: 2014-09-07 | Discharge: 2014-09-07 | Disposition: A | Discharge status: Home / Self Care | Payer: 59 | Source: Ambulatory Visit | Attending: Family Medicine | Admitting: Family Medicine

## 2014-09-07 ENCOUNTER — Encounter: Payer: Self-pay | Admitting: Family Medicine

## 2014-09-07 VITALS — BP 157/71 | Ht 68.0 in | Wt 240.0 lb

## 2014-09-07 DIAGNOSIS — M25571 Pain in right ankle and joints of right foot: Secondary | ICD-10-CM

## 2014-09-07 DIAGNOSIS — M25579 Pain in unspecified ankle and joints of unspecified foot: Secondary | ICD-10-CM | POA: Insufficient documentation

## 2014-09-07 NOTE — Assessment & Plan Note (Signed)
Most likely this is a result of incomplete rehabilitation after multiple sprains. She is unable to do one single stance heel raise. We will get x-rays just to rule out anything unusual. I will start her on ankle rehabilitation program and see her back in 3-4 weeks. We gave her the exercises in handout form and demonstrated them for her and also gave her an elastic exercise band

## 2014-09-07 NOTE — Progress Notes (Signed)
   Subjective:    Patient ID: Kendra Thompson, female    DOB: [birth year], 63 y.o.   MRN: [id number]  HPI  Right ankle pain. She rolled her ankle while walking a few months ago. It has continued to be painful and somewhat swollen since then. She reports many prior ankle sprains as a child and teenager. Her ankle bothers her most now when she walks or stands for long period of time.  Review of Systems She's noted no erythema of the ankle but has seen some swelling. She's had no fever.    Objective:   Physical Exam  Vital signs reviewed GENERAL: Well-developed overweight female no acute distress :ANKLE:Right.slight soft tissue swelling over the ATF area. Intact anterior drawer. She has some pain with dorsiflexion. Her strength in dorsiflexion plantar flexion and eversion and inversion is intact. NEUROVASCULAR: Intact sensation soft touch in the ankle and foot. Dorsalis pedis pulses are 2+ bilaterally equal.      Assessment & Plan:

## 2014-09-11 ENCOUNTER — Encounter: Payer: Self-pay | Admitting: Family Medicine

## 2014-09-13 ENCOUNTER — Telehealth: Payer: Self-pay | Admitting: *Deleted

## 2014-09-13 NOTE — Telephone Encounter (Signed)
[place] Tell her x rays were normal THANKS! Dorcas Mcmurray

## 2014-09-13 NOTE — Telephone Encounter (Signed)
Pt informed of normal images

## 2014-09-18 ENCOUNTER — Other Ambulatory Visit: Payer: Self-pay | Admitting: *Deleted

## 2014-09-19 MED ORDER — AMLODIPINE BESYLATE 10 MG PO TABS: ORAL_TABLET | ORAL | Status: DC

## 2014-10-01 ENCOUNTER — Ambulatory Visit: Payer: 59 | Admitting: Family Medicine

## 2014-10-12 ENCOUNTER — Ambulatory Visit: Payer: 59 | Admitting: Family Medicine

## 2014-12-03 ENCOUNTER — Ambulatory Visit (INDEPENDENT_AMBULATORY_CARE_PROVIDER_SITE_OTHER): Payer: 59 | Admitting: Family Medicine

## 2014-12-03 ENCOUNTER — Encounter: Payer: Self-pay | Admitting: Family Medicine

## 2014-12-03 VITALS — BP 147/82 | Ht 68.0 in | Wt 225.0 lb

## 2014-12-03 DIAGNOSIS — M25571 Pain in right ankle and joints of right foot: Secondary | ICD-10-CM

## 2014-12-03 NOTE — Patient Instructions (Signed)
[facility] Address: [address] [place], [place], [place] [address] [phone]

## 2014-12-04 NOTE — Assessment & Plan Note (Signed)
She's had no improvement from fairly aggressive strengthening program at home. After reviewing her old MRI report, I think it would be  best to send her to orthopedics for further evaluation. We have made her an appointment. She'll follow-up here when necessary

## 2014-12-04 NOTE — Progress Notes (Signed)
   Subjective:    Patient ID: Kendra Thompson, female    DOB: 06-19-1951, 64 y.o.   MRN: [id number]  HPI Follow-up right ankle pain. She had obtained x-rays. She had done the exercises and has had no improvement. Pain is worse with walking especially fits up hill or up steps. Anterior and lateral ankle.   Review of Systems No ankle swelling or erythema. No ankle or foot numbness. No falls.    Objective:   Physical Exam Vital signs are reviewed GEN.: Well-developed female no acute distress ANKLE: Right. Anterior drawer is symmetrical with the left side having a good endpoint. Inversion and eversion are full range of motion. She has some pain with inversion. Her arches mildly flat longitudinally as well as the transverse arch. NEURO: Intact sensation to soft touch and 2 point discrimination bilaterally symmetrical and normal in the feet. SKIN: I see no lesions, no erythema or ecchymoses on the ankle.  IMAGING: Report from MRI of the right ankle and foot done in 2014 at [place]. I do not have the images. The report indicates mild to moderate arthritic changes. 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       Assessment & Plan:

## 2015-02-11 ENCOUNTER — Encounter: Payer: Self-pay | Admitting: Family Medicine

## 2015-07-31 ENCOUNTER — Encounter: Payer: Self-pay | Admitting: Family Medicine

## 2015-07-31 ENCOUNTER — Other Ambulatory Visit (HOSPITAL_COMMUNITY)
Admission: RE | Admit: 2015-07-31 | Discharge: 2015-07-31 | Disposition: A | Discharge status: Home / Self Care | Payer: 59 | Source: Ambulatory Visit | Attending: Family Medicine | Admitting: Family Medicine

## 2015-07-31 ENCOUNTER — Ambulatory Visit (INDEPENDENT_AMBULATORY_CARE_PROVIDER_SITE_OTHER): Payer: 59 | Admitting: Family Medicine

## 2015-07-31 VITALS — BP 134/71 | HR 89 | Temp 98.5°F | Ht 68.0 in | Wt 229.6 lb

## 2015-07-31 DIAGNOSIS — Z1151 Encounter for screening for human papillomavirus (HPV): Secondary | ICD-10-CM | POA: Diagnosis not present

## 2015-07-31 DIAGNOSIS — Z01419 Encounter for gynecological examination (general) (routine) without abnormal findings: Secondary | ICD-10-CM | POA: Diagnosis not present

## 2015-07-31 DIAGNOSIS — E669 Obesity, unspecified: Secondary | ICD-10-CM

## 2015-07-31 DIAGNOSIS — I10 Essential (primary) hypertension: Secondary | ICD-10-CM

## 2015-07-31 DIAGNOSIS — R7309 Other abnormal glucose: Secondary | ICD-10-CM | POA: Diagnosis not present

## 2015-07-31 DIAGNOSIS — Z0000 Encounter for general adult medical examination without abnormal findings: Secondary | ICD-10-CM | POA: Diagnosis not present

## 2015-07-31 DIAGNOSIS — Z124 Encounter for screening for malignant neoplasm of cervix: Secondary | ICD-10-CM | POA: Diagnosis not present

## 2015-07-31 LAB — COMPREHENSIVE METABOLIC PANEL
ALBUMIN: 4.4 g/dL (ref 3.6–5.1)
ALT: 28 U/L (ref 6–29)
AST: 17 U/L (ref 10–35)
Alkaline Phosphatase: 77 U/L (ref 33–130)
BILIRUBIN TOTAL: 0.6 mg/dL (ref 0.2–1.2)
BUN: 16 mg/dL (ref 7–25)
CO2: 28 mmol/L (ref 20–31)
Calcium: 9.4 mg/dL (ref 8.6–10.4)
Chloride: 97 mmol/L — ABNORMAL LOW (ref 98–110)
Creat: 0.75 mg/dL (ref 0.50–0.99)
Glucose, Bld: 118 mg/dL — ABNORMAL HIGH (ref 65–99)
Potassium: 3.9 mmol/L (ref 3.5–5.3)
Sodium: 136 mmol/L (ref 135–146)
Total Protein: 7.2 g/dL (ref 6.1–8.1)

## 2015-07-31 LAB — LDL CHOLESTEROL, DIRECT: LDL DIRECT: 113 mg/dL (ref <130)

## 2015-07-31 LAB — POCT GLYCOSYLATED HEMOGLOBIN (HGB A1C): HEMOGLOBIN A1C: 5.7

## 2015-07-31 MED ORDER — AMLODIPINE BESYLATE 10 MG PO TABS: ORAL_TABLET | ORAL | Status: DC

## 2015-07-31 MED ORDER — LOSARTAN POTASSIUM-HCTZ 100-25 MG PO TABS: ORAL_TABLET | ORAL | Status: DC

## 2015-07-31 NOTE — Patient Instructions (Signed)
You look FABULOUS!  congratulations on weight loss. I will send you a note about your Pap smear and about her labs. If there's any abnormality I'll call you. Great to see you !

## 2015-08-01 LAB — HIV ANTIBODY (ROUTINE TESTING W REFLEX): HIV 1&2 Ab, 4th Generation: NONREACTIVE

## 2015-08-01 LAB — HEPATITIS C ANTIBODY: HCV Ab: NEGATIVE

## 2015-08-02 LAB — CYTOLOGY - PAP

## 2015-08-03 DIAGNOSIS — E669 Obesity, unspecified: Secondary | ICD-10-CM | POA: Insufficient documentation

## 2015-08-03 NOTE — Assessment & Plan Note (Signed)
Recheck A1c.  Given her LSM on modifications I'm hoping this is back in the normal range.

## 2015-08-03 NOTE — Assessment & Plan Note (Signed)
  Excellent Blood pressure control.Continue current medications without change.  Refills given.  Lab work today.  Follow-up 1 year.

## 2015-08-03 NOTE — Progress Notes (Signed)
   Subjective:    Patient ID: Kendra Thompson, female    DOB: 1951/03/31, 64 y.o.   MRN: [id number]  HPI  Well adult check #1.  Obesity: Has been on a new diagnosis exercise program, has lost about 42 pounds.  Very excited about this.  No longer having problems with her reflux or bilateral knee pain that she was having previously. #2.  Hypertension: Denies chest pain shortness of breath or lower extremity edema.  She is compliant with her medications and is not having any problems with them.  Review of Systems  Constitutional: Negative for activity change, appetite change and unexpected weight change.  HENT: Negative for ear pain and rhinorrhea.   Eyes: Negative for pain and visual disturbance.  Respiratory: Negative for cough, chest tightness and shortness of breath.   Gastrointestinal: Negative for nausea, abdominal pain and constipation.  Genitourinary: Negative for dysuria, vaginal bleeding and pelvic pain.  Musculoskeletal: Negative for joint swelling and arthralgias.  Neurological: Negative for dizziness and headaches.  Psychiatric/Behavioral: Negative for dysphoric mood, decreased concentration and agitation.       Objective:   Physical Exam  Vital signs reviewed GENERALl: Well developed, well nourished, in no acute distress. NECK: Supple, FROM, without lymphadenopathy.  HEENT: Pupils equal round react to light.  Extraocular muscles intact.  Discs bilaterally flat.  Conjunctivae nonicteric. THYROID: normal without nodularity CAROTID ARTERIES: without bruits LUNGS: clear to auscultation bilaterally. No wheezes or rales. HEART: Regular rate and rhythm, no murmurs ABDOMEN: soft with positive bowel sounds MSK: MOE x 4. Full range of motion bilateral knees with no crepitus.  No effusion.  Normal strength, muscle bulk and tone. PSYCH: Alert and oriented 4.  Affect interactive.  Speech is normal content fluency.  Recent and remote memory is intact. GU: Externally slightly  atrophic.  Cervix appears normal.  No adnexal masses or tenderness.  Uterus is normal in size and position. SKIN no rash NEURO: no  gross focal deficits       Assessment & Plan:

## 2015-08-03 NOTE — Assessment & Plan Note (Signed)
Congratulated on weight loss and wants some modifications.  We'll check CMP, direct LDL.  Discussed calcium and exercise, vitamin D.  Updated flu shot.

## 2015-08-03 NOTE — Assessment & Plan Note (Signed)
Pelvic and breast exam today.  Mammogram is UTD

## 2015-08-05 ENCOUNTER — Encounter: Payer: Self-pay | Admitting: Family Medicine

## 2015-08-27 ENCOUNTER — Encounter: Payer: Self-pay | Admitting: Family Medicine

## 2015-08-31 ENCOUNTER — Telehealth: Payer: 59 | Admitting: Nurse Practitioner

## 2015-08-31 DIAGNOSIS — J069 Acute upper respiratory infection, unspecified: Secondary | ICD-10-CM | POA: Diagnosis not present

## 2015-08-31 MED ORDER — BENZONATATE 100 MG PO CAPS: 100.0000 mg | ORAL_CAPSULE | Freq: Three times a day (TID) | ORAL | Status: DC | PRN

## 2015-08-31 MED ORDER — AZITHROMYCIN 250 MG PO TABS: ORAL_TABLET | ORAL | Status: DC

## 2015-08-31 NOTE — Progress Notes (Signed)

## 2015-10-27 DIAGNOSIS — M1990 Unspecified osteoarthritis, unspecified site: Secondary | ICD-10-CM

## 2015-10-27 HISTORY — DX: Unspecified osteoarthritis, unspecified site: M19.90

## 2015-11-19 ENCOUNTER — Other Ambulatory Visit: Payer: Self-pay

## 2015-11-19 DIAGNOSIS — Z1231 Encounter for screening mammogram for malignant neoplasm of breast: Secondary | ICD-10-CM

## 2015-11-28 MED FILL — LOSARTAN-HCTZ 100-25 MG TAB: 100-25 | 90 days supply | Qty: 90 | Fill #1

## 2015-11-28 MED FILL — AMLODIPINE BESYLATE 10 MG T: 10 | 90 days supply | Qty: 90 | Fill #1

## 2015-12-02 ENCOUNTER — Ambulatory Visit
Admission: RE | Admit: 2015-12-02 | Discharge: 2015-12-02 | Disposition: A | Discharge status: Home / Self Care | Payer: 59 | Source: Ambulatory Visit

## 2015-12-02 DIAGNOSIS — Z1231 Encounter for screening mammogram for malignant neoplasm of breast: Secondary | ICD-10-CM | POA: Diagnosis not present

## 2016-03-16 MED FILL — LOSARTAN-HCTZ 100-25 MG TAB: 100-25 | 90 days supply | Qty: 90 | Fill #2

## 2016-03-16 MED FILL — AMLODIPINE BESYLATE 10 MG T: 10 | 90 days supply | Qty: 90 | Fill #2

## 2016-06-17 MED FILL — LOSARTAN-HCTZ 100-25 MG TAB: 100-25 | 90 days supply | Qty: 90 | Fill #3

## 2016-06-17 MED FILL — AMLODIPINE BESYLATE 10 MG T: 10 | 90 days supply | Qty: 90 | Fill #3

## 2016-09-09 ENCOUNTER — Encounter: Payer: Self-pay | Admitting: Family Medicine

## 2016-09-09 ENCOUNTER — Ambulatory Visit (INDEPENDENT_AMBULATORY_CARE_PROVIDER_SITE_OTHER): Payer: 59 | Admitting: Family Medicine

## 2016-09-09 VITALS — BP 150/67 | HR 78 | Temp 98.5°F | Wt 226.0 lb

## 2016-09-09 DIAGNOSIS — G8929 Other chronic pain: Secondary | ICD-10-CM

## 2016-09-09 DIAGNOSIS — M25561 Pain in right knee: Secondary | ICD-10-CM | POA: Diagnosis not present

## 2016-09-09 DIAGNOSIS — R7309 Other abnormal glucose: Secondary | ICD-10-CM

## 2016-09-09 DIAGNOSIS — I10 Essential (primary) hypertension: Secondary | ICD-10-CM

## 2016-09-09 DIAGNOSIS — Z0000 Encounter for general adult medical examination without abnormal findings: Secondary | ICD-10-CM

## 2016-09-09 DIAGNOSIS — M25562 Pain in left knee: Secondary | ICD-10-CM

## 2016-09-09 LAB — COMPLETE METABOLIC PANEL WITH GFR
ALT: 19 U/L (ref 6–29)
AST: 13 U/L (ref 10–35)
Albumin: 4.2 g/dL (ref 3.6–5.1)
Alkaline Phosphatase: 62 U/L (ref 33–130)
BUN: 19 mg/dL (ref 7–25)
CHLORIDE: 100 mmol/L (ref 98–110)
CO2: 29 mmol/L (ref 20–31)
CREATININE: 0.76 mg/dL (ref 0.50–0.99)
Calcium: 9.3 mg/dL (ref 8.6–10.4)
GFR, Est African American: >89 mL/min (ref >=60)
GFR, Est Non African American: 84 mL/min (ref >=60)
Glucose, Bld: 119 mg/dL — ABNORMAL HIGH (ref 65–99)
Potassium: 4.1 mmol/L (ref 3.5–5.3)
SODIUM: 137 mmol/L (ref 135–146)
TOTAL PROTEIN: 6.9 g/dL (ref 6.1–8.1)
Total Bilirubin: 0.6 mg/dL (ref 0.2–1.2)

## 2016-09-09 LAB — LIPID PANEL
Cholesterol: 171 mg/dL (ref <200)
HDL: 36 mg/dL — ABNORMAL LOW (ref >50)
LDL Cholesterol: 111 mg/dL — ABNORMAL HIGH (ref <100)
TRIGLYCERIDES: 118 mg/dL (ref <150)
Total CHOL/HDL Ratio: 4.8 Ratio (ref <5.0)
VLDL: 24 mg/dL (ref <30)

## 2016-09-09 LAB — POCT GLYCOSYLATED HEMOGLOBIN (HGB A1C): HEMOGLOBIN A1C: 5.5

## 2016-09-09 MED ORDER — AMLODIPINE BESYLATE 10 MG PO TABS: ORAL_TABLET | ORAL | 3 refills | Status: DC

## 2016-09-09 MED ORDER — LOSARTAN POTASSIUM-HCTZ 100-25 MG PO TABS: ORAL_TABLET | ORAL | 3 refills | Status: DC

## 2016-09-09 NOTE — Patient Instructions (Signed)
X ray order for your knees is in. I will send you a note about your labs See me in3-4 weeks to want to follow up on the knees Great to see you!

## 2016-09-11 DIAGNOSIS — M1710 Unilateral primary osteoarthritis, unspecified knee: Secondary | ICD-10-CM | POA: Insufficient documentation

## 2016-09-11 DIAGNOSIS — M25561 Pain in right knee: Secondary | ICD-10-CM

## 2016-09-11 DIAGNOSIS — G8929 Other chronic pain: Secondary | ICD-10-CM | POA: Insufficient documentation

## 2016-09-11 DIAGNOSIS — M25562 Pain in left knee: Secondary | ICD-10-CM

## 2016-09-11 NOTE — Assessment & Plan Note (Signed)
L give her a prescription for Zostavax today. She's otherwise up-to-date on her health maintenance.

## 2016-09-11 NOTE — Assessment & Plan Note (Signed)
>>  ASSESSMENT AND PLAN FOR ARTHRITIS OF BOTH KNEES WRITTEN ON 09/11/2016  5:34 PM BY Maryrose Colvin L, MD  We'll get some x-rays to see where she is in regard to joint space. We gave her an injection left knee today. I'll see her back in 3-4 weeks and follow-up how that once doing. We will contemplate doing the right knee at that time depending on her success with this injection.

## 2016-09-11 NOTE — Assessment & Plan Note (Signed)
We'll get some x-rays to see where she is in regard to joint space. We gave her an injection left knee today. I'll see her back in 3-4 weeks and follow-up how that once doing. We will contemplate doing the right knee at that time depending on her success with this injection.

## 2016-09-11 NOTE — Assessment & Plan Note (Signed)
Well-controlled Continue current medications Check labs 

## 2016-09-11 NOTE — Progress Notes (Signed)
    CHIEF COMPLAINT / HPI:   CPE Has gained some of her weight back and she is not happy about that but otherwise is doing well. Continues to work 40+ hours per week. She is having some bilateral knee pain is keeping her from doing some of her walking activities. The left was particularly bothersome.  REVIEW OF SYSTEMS:  Complete 14 point review of systems is otherwise negative for the knee pain mentioned above and the weight.  OBJECTIVE:  Vital signs are reviewed.   Vital signs reviewed GENERALl: Well developed, well nourished, in no acute distress. HEENT: Conjunctivae nonicteric. Muscles are intact. Pupils equal round reactive to light. NECK: Supple, FROM, without lymphadenopathy. No JVD.  THYROID: normal without nodularity CAROTID ARTERIES: without bruits LUNGS: clear to auscultation bilaterally. No wheezes or rales. HEART: Regular rate and rhythm, no murmurs ABDOMEN: soft with positive bowel sounds MSK: MOE x 4 . Bilateral knees tender to palpation medial joint line. The left knee is also tender palpation on the lateral joint line. No effusion. No erythema or unusual warmth. She has full range of motion flexion extension bilaterally. Popliteal space is soft. The calf is benign. SKIN no rash NEURO: no focal deficits. PSYCHIATRIC: Alert and oriented 4. Affects interactive. Remote and recent memory is intact.  INJECTION: Patient was given informed consent, signed copy in the chart. Appropriate time out was taken. Area prepped and draped in usual sterile fashion. 1 cc of methylprednisolone 40 mg/ml plus  4 cc of 1% lidocaine without epinephrine was injected into the left knee using a(n) anterior medial approach. The patient tolerated the procedure well. There were no complications. Post procedure instructions were given.   ASSESSMENT / PLAN: Please see problem oriented charting for details

## 2016-09-16 ENCOUNTER — Encounter: Payer: Self-pay | Admitting: Family Medicine

## 2016-09-20 ENCOUNTER — Encounter: Payer: Self-pay | Admitting: Family Medicine

## 2016-09-23 ENCOUNTER — Ambulatory Visit (HOSPITAL_COMMUNITY)
Admission: RE | Admit: 2016-09-23 | Discharge: 2016-09-23 | Disposition: A | Discharge status: Home / Self Care | Payer: 59 | Source: Ambulatory Visit | Attending: Family Medicine | Admitting: Family Medicine

## 2016-09-23 ENCOUNTER — Encounter: Payer: Self-pay | Admitting: Family Medicine

## 2016-09-23 DIAGNOSIS — M25562 Pain in left knee: Secondary | ICD-10-CM | POA: Insufficient documentation

## 2016-09-23 DIAGNOSIS — G8929 Other chronic pain: Secondary | ICD-10-CM | POA: Diagnosis not present

## 2016-09-23 DIAGNOSIS — M25561 Pain in right knee: Secondary | ICD-10-CM | POA: Insufficient documentation

## 2016-09-23 DIAGNOSIS — M170 Bilateral primary osteoarthritis of knee: Secondary | ICD-10-CM | POA: Insufficient documentation

## 2016-09-23 MED FILL — AMLODIPINE BESYLATE 10 MG T: 10 | 90 days supply | Qty: 90 | Fill #0

## 2016-09-23 MED FILL — LOSARTAN-HCTZ 100-25 MG TAB: 100-25 | 90 days supply | Qty: 90 | Fill #0

## 2016-10-09 ENCOUNTER — Ambulatory Visit (INDEPENDENT_AMBULATORY_CARE_PROVIDER_SITE_OTHER): Payer: 59 | Admitting: Family Medicine

## 2016-10-09 ENCOUNTER — Encounter: Payer: Self-pay | Admitting: Family Medicine

## 2016-10-09 VITALS — BP 148/70 | Ht 68.0 in | Wt 220.0 lb

## 2016-10-09 DIAGNOSIS — M25562 Pain in left knee: Secondary | ICD-10-CM | POA: Diagnosis not present

## 2016-10-09 DIAGNOSIS — G8929 Other chronic pain: Secondary | ICD-10-CM | POA: Diagnosis not present

## 2016-10-09 DIAGNOSIS — M25561 Pain in right knee: Secondary | ICD-10-CM | POA: Diagnosis not present

## 2016-10-09 MED ORDER — METHYLPREDNISOLONE ACETATE 40 MG/ML IJ SUSP
40.0000 mg | Freq: Once | INTRAMUSCULAR | Status: AC
  Administered 2016-10-09: 40 mg via INTRA_ARTICULAR

## 2016-10-14 NOTE — Assessment & Plan Note (Signed)
>>  ASSESSMENT AND PLAN FOR ARTHRITIS OF BOTH KNEES WRITTEN ON 10/14/2016 11:45 AM BY Meghen Akopyan L, MD  Reviewed x rays Discussion re injection therapy Decided to do 2nd injection on LEFT and first CSI on RIGHT today. F/u prn

## 2016-10-14 NOTE — Assessment & Plan Note (Signed)
Reviewed x rays Discussion re injection therapy Decided to do 2nd injection on LEFT and first CSI on RIGHT today. F/u prn

## 2016-10-14 NOTE — Progress Notes (Signed)
  Kendra Thompson - 65 y.o. female MRN [place]:[id number]  Date of birth: [birth year]    SUBJECTIVE:      Chief Complaint:/ HPI:   Knee pain At last ov I injected her left knee and she had significant relief. Unfortunatekly she then had a trip where she had a lot of walking to do and aggravated it agin---now back to where it was before injection.We had planned if she got relief we would consider CSI other knee today Also here to review knee x rays.   ROS:     Pertinent review of systems: negative for fever or unusual weight change.   PERTINENT  PMH / PSH FH / / SH:  Past Medical, Surgical, Social, and Family History Reviewed & Updated in the EMR.  Pertinent findings include:  No personal hx DM. Non smoker. Obesity  IMAGIMNG:  Standing B knee AP--bilateral medial compartment joint space narrowing, some osteophytes. I have reviewed images with her. Agree with radiology report  OBJECTIVE: BP (!) 148/70   Ht 5\' 8"  (1.727 m)   Wt 220 lb (99.8 kg)   BMI 33.45 kg/m   Physical Exam:  Vital signs are reviewed. GEN WD WN NAD overweight KNEES: B without unusual warmth, no swelling. She has joint line tenderness on both knee, R>L. FROM in ext and flexion. Ligamentously intact.popliteal space is benign. Calf is soft NEURO: intact sensation to soft touch B LE. VASC: dorsalis pedis 2+ B=.  INJECTION: Patient was given informed consent, signed copy in the chart. Appropriate time out was taken. Area prepped and draped in usual sterile fashion. 1 cc of methylprednisolone 40 mg/ml plus  4 cc of 1% lidocaine without epinephrine was injected into the BILATERAL KNEES  using a(n) anterior medial approach. The patient tolerated the procedure well. There were no complications. Post procedure instructions were given.   ASSESSMENT & PLAN:  See problem based charting & AVS for pt instructions.

## 2016-11-16 ENCOUNTER — Other Ambulatory Visit: Payer: Self-pay | Admitting: Family Medicine

## 2016-11-16 DIAGNOSIS — Z1231 Encounter for screening mammogram for malignant neoplasm of breast: Secondary | ICD-10-CM

## 2016-12-03 ENCOUNTER — Ambulatory Visit
Admission: RE | Admit: 2016-12-03 | Discharge: 2016-12-03 | Disposition: A | Discharge status: Home / Self Care | Payer: 59 | Source: Ambulatory Visit | Attending: Family Medicine | Admitting: Family Medicine

## 2016-12-03 DIAGNOSIS — Z1231 Encounter for screening mammogram for malignant neoplasm of breast: Secondary | ICD-10-CM

## 2016-12-08 ENCOUNTER — Encounter: Payer: Self-pay | Admitting: Family Medicine

## 2017-01-27 MED FILL — LOSARTAN-HCTZ 100-25 MG TAB: 100-25 | 90 days supply | Qty: 90 | Fill #1

## 2017-01-27 MED FILL — AMLODIPINE BESYLATE 10 MG T: 10 | 90 days supply | Qty: 90 | Fill #1

## 2017-04-09 ENCOUNTER — Ambulatory Visit (INDEPENDENT_AMBULATORY_CARE_PROVIDER_SITE_OTHER): Payer: 59 | Admitting: Family Medicine

## 2017-04-09 ENCOUNTER — Encounter: Payer: Self-pay | Admitting: Family Medicine

## 2017-04-09 DIAGNOSIS — M25561 Pain in right knee: Secondary | ICD-10-CM | POA: Diagnosis not present

## 2017-04-09 DIAGNOSIS — M25562 Pain in left knee: Secondary | ICD-10-CM | POA: Diagnosis not present

## 2017-04-09 DIAGNOSIS — G8929 Other chronic pain: Secondary | ICD-10-CM

## 2017-04-09 MED ORDER — METHYLPREDNISOLONE ACETATE 40 MG/ML IJ SUSP
40.0000 mg | Freq: Once | INTRAMUSCULAR | Status: AC
  Administered 2017-04-09: 40 mg via INTRA_ARTICULAR

## 2017-04-11 NOTE — Assessment & Plan Note (Signed)
Acute flare of her underlying arthritis. She has done well with injection therapy.  - bilateral knee injections today  - consider medial unloader brace for left knee  - could consider visco injections in the future.  - f/u PRN

## 2017-04-11 NOTE — Assessment & Plan Note (Signed)
>>  ASSESSMENT AND PLAN FOR ARTHRITIS OF BOTH KNEES WRITTEN ON 04/11/2017  7:52 PM BY SCHMITZ, JEREMY E, MD  Acute flare of her underlying arthritis. She has done well with injection therapy.  - bilateral knee injections today  - consider medial unloader brace for left knee  - could consider visco injections in the future.  - f/u PRN

## 2017-04-11 NOTE — Progress Notes (Signed)
ALEA RYER - 66 y.o. female MRN [id number]  Date of birth: 11-Apr-1951  SUBJECTIVE:  Including CC & ROS.   Ms. Cadden is a 66 yo F that is presenting with bilateral knee pain. The pain is worse on the right. The pain is medial aspect of both knees. She has an injection in December and that has helped until recently. She takes ibuprofen for the pain. Sometimes her knee feels like it might give way. She has performed PT. Pain is worse with going down stairs.   ROS: No unexpected weight loss, fever, chills, instability, muscle pain, numbness/tingling, redness, otherwise see HPI   HISTORY: Past Medical, Surgical, Social, and Family History Reviewed & Updated per EMR.   Pertinent Historical Findings include: PMHx: HTN, GERD Surgical:   D&C Social:  No tobacco use  FHx: Dm2  DATA REVIEWED: 09/23/16: bilateral knee x-rays: There is mild osteoarthritic narrowing of the medial joint compartments greatest on the left. No acute bony abnormality is observed.  PHYSICAL EXAM:  VS: BP (!) 148/80   Ht 5\' 8"  (1.727 m)   Wt 230 lb (104.3 kg)   BMI 34.97 kg/m  PHYSICAL EXAM: Gen: NAD, alert, cooperative with exam, well-appearing HEENT: clear conjunctiva, EOMI CV:  no edema, capillary refill brisk,  Resp: non-labored, normal speech Skin: no rashes, normal turgor  Neuro: no gross deficits.  Psych:  alert and oriented MSK:  Knee: Normal to inspection with no erythema or effusion or obvious bony abnormalities. Some medial joint line tenderness  Palpation normal with no warmth, patellar tenderness, or condyle tenderness. ROM full in flexion and extension and lower leg rotation. Ligaments with solid consistent endpoints including  LCL, MCL. Negative Mcmurray's Non painful patellar compression. Patellar glide without crepitus. Patellar and quadriceps tendons unremarkable. Hamstring and quadriceps strength is normal.  Neurovascularly intact    Aspiration/Injection Procedure Note VERLE BRILLHART 17-Mar-1951  Procedure: Injection Indications: right knee pain   Procedure Details Consent: Risks of procedure as well as the alternatives and risks of each were explained to the (patient/caregiver).  Consent for procedure obtained. Time Out: Verified patient identification, verified procedure, site/side was marked, verified correct patient position, special equipment/implants available, medications/allergies/relevent history reviewed, required imaging and test results available.  Performed.  The area was cleaned with iodine and alcohol swabs.    The right knee was injected using 1 cc's of 40 mg Depomedrol and 4 cc's of 1% lidocaine with a 22 1 1/2" needle.    A sterile dressing was applied.  Patient did tolerate procedure well.   Aspiration/Injection Procedure Note KEYLA MILONE 1950-11-30  Procedure: Injection Indications: left knee pain   Procedure Details Consent: Risks of procedure as well as the alternatives and risks of each were explained to the (patient/caregiver).  Consent for procedure obtained. Time Out: Verified patient identification, verified procedure, site/side was marked, verified correct patient position, special equipment/implants available, medications/allergies/relevent history reviewed, required imaging and test results available.  Performed.  The area was cleaned with iodine and alcohol swabs.    The left knee was injected using 1 cc's of 40 mg Depomedrol and 4 cc's of 1% lidocaine with a 21 1 1/2" needle.    A sterile dressing was applied.  Patient did tolerate procedure well.   ASSESSMENT & PLAN:   Chronic pain of both knees Acute flare of her underlying arthritis. She has done well with injection therapy.  - bilateral knee injections today  - consider medial unloader brace for left knee  -  could consider visco injections in the future.  - f/u PRN

## 2017-04-12 DIAGNOSIS — H5203 Hypermetropia, bilateral: Secondary | ICD-10-CM | POA: Diagnosis not present

## 2017-04-12 NOTE — Progress Notes (Signed)
[facility]: Attending Note: I have reviewed the chart, discussed wit the Sports Medicine Fellow. I agree with assessment and treatment plan as detailed in the [facility] note. I performed left knee injection. We discussed long term planning--weight loss etc--potential need for TKR in future.

## 2017-04-26 MED FILL — AMLODIPINE BESYLATE 10 MG T: 10 | 90 days supply | Qty: 90 | Fill #2

## 2017-04-26 MED FILL — LOSARTAN-HCTZ 100-25 MG TAB: 100-25 | 90 days supply | Qty: 90 | Fill #2

## 2017-06-29 ENCOUNTER — Other Ambulatory Visit: Payer: Self-pay | Admitting: Family Medicine

## 2017-06-29 DIAGNOSIS — G8929 Other chronic pain: Secondary | ICD-10-CM

## 2017-06-29 DIAGNOSIS — R1013 Epigastric pain: Principal | ICD-10-CM

## 2017-07-01 ENCOUNTER — Encounter: Payer: Self-pay | Admitting: Family Medicine

## 2017-07-29 ENCOUNTER — Telehealth: Payer: Self-pay | Admitting: Gastroenterology

## 2017-08-02 ENCOUNTER — Encounter: Payer: Self-pay | Admitting: Gastroenterology

## 2017-08-09 MED FILL — LOSARTAN-HCTZ 100-25 MG TAB: 100-25 | 90 days supply | Qty: 90 | Fill #3

## 2017-08-09 MED FILL — AMLODIPINE BESYLATE 10 MG T: 10 | 90 days supply | Qty: 90 | Fill #3

## 2017-09-22 ENCOUNTER — Other Ambulatory Visit: Payer: Self-pay

## 2017-09-22 ENCOUNTER — Ambulatory Visit (AMBULATORY_SURGERY_CENTER): Payer: Self-pay | Admitting: *Deleted

## 2017-09-22 VITALS — Ht 68.0 in | Wt 251.0 lb

## 2017-09-22 DIAGNOSIS — Z86010 Personal history of colonic polyps: Secondary | ICD-10-CM

## 2017-09-22 MED ORDER — NA SULFATE-K SULFATE-MG SULF 17.5-3.13-1.6 GM/177ML PO SOLN: ORAL | 0 refills | Status: DC

## 2017-09-22 MED FILL — SUPREP BOWEL PREP KIT: 17.5-3.13-1 | 1 days supply | Qty: 354 | Fill #0

## 2017-09-22 NOTE — Progress Notes (Signed)
Patient denies any allergies to eggs or soy. Patient denies any problems with anesthesia/sedation. Patient denies any oxygen use at home. Patient denies taking any diet/weight loss medications or blood thinners. EMMI education assisgned to patient on colonoscopy, this was explained and instructions given to patient. 

## 2017-09-23 ENCOUNTER — Encounter: Payer: Self-pay | Admitting: Gastroenterology

## 2017-09-29 ENCOUNTER — Other Ambulatory Visit: Payer: Self-pay

## 2017-09-29 ENCOUNTER — Encounter: Payer: Self-pay | Admitting: Family Medicine

## 2017-09-29 ENCOUNTER — Ambulatory Visit (INDEPENDENT_AMBULATORY_CARE_PROVIDER_SITE_OTHER): Payer: 59 | Admitting: Family Medicine

## 2017-09-29 VITALS — BP 136/74 | HR 72 | Temp 97.8°F | Ht 68.0 in | Wt 249.2 lb

## 2017-09-29 DIAGNOSIS — Z0000 Encounter for general adult medical examination without abnormal findings: Secondary | ICD-10-CM | POA: Diagnosis not present

## 2017-09-29 DIAGNOSIS — M1810 Unilateral primary osteoarthritis of first carpometacarpal joint, unspecified hand: Secondary | ICD-10-CM

## 2017-09-29 LAB — POCT GLYCOSYLATED HEMOGLOBIN (HGB A1C): Hemoglobin A1C: 6.1

## 2017-09-29 NOTE — Patient Instructions (Signed)
You look GREAT!  

## 2017-09-30 LAB — CMP14+EGFR
ALK PHOS: 79 IU/L (ref 39–117)
ALT: 25 IU/L (ref 0–32)
AST: 20 IU/L (ref 0–40)
Albumin/Globulin Ratio: 1.5 (ref 1.2–2.2)
Albumin: 4.3 g/dL (ref 3.6–4.8)
BUN/Creatinine Ratio: 19 (ref 12–28)
BUN: 15 mg/dL (ref 8–27)
Bilirubin Total: 0.6 mg/dL (ref 0.0–1.2)
CO2: 24 mmol/L (ref 20–29)
Calcium: 9.5 mg/dL (ref 8.7–10.3)
Chloride: 99 mmol/L (ref 96–106)
Creatinine, Ser: 0.77 mg/dL (ref 0.57–1.00)
GFR calc Af Amer: 94 mL/min/{1.73_m2} (ref >59)
GFR calc non Af Amer: 82 mL/min/{1.73_m2} (ref >59)
GLOBULIN, TOTAL: 2.9 g/dL (ref 1.5–4.5)
GLUCOSE: 108 mg/dL — AB (ref 65–99)
Potassium: 4.3 mmol/L (ref 3.5–5.2)
Sodium: 140 mmol/L (ref 134–144)
Total Protein: 7.2 g/dL (ref 6.0–8.5)

## 2017-09-30 LAB — LIPID PANEL
Chol/HDL Ratio: 4.3 ratio (ref 0.0–4.4)
Cholesterol, Total: 179 mg/dL (ref 100–199)
HDL: 42 mg/dL (ref >39)
LDL Calculated: 111 mg/dL — ABNORMAL HIGH (ref 0–99)
TRIGLYCERIDES: 129 mg/dL (ref 0–149)
VLDL Cholesterol Cal: 26 mg/dL (ref 5–40)

## 2017-10-01 DIAGNOSIS — M1810 Unilateral primary osteoarthritis of first carpometacarpal joint, unspecified hand: Secondary | ICD-10-CM | POA: Insufficient documentation

## 2017-10-01 NOTE — Progress Notes (Signed)
    CHIEF COMPLAINT / HPI:   check up That he needs to have some problems with knee pain although right now she is not having enough to want to get a shot.  She also intermittently has problems with bilateral thumbs right greater than left.  Aching pain.  Seems worse with weather change. Has her colonoscopy scheduled next week.   REVIEW OF SYSTEMS:  Review of Systems  Constitutional: Negative for activity chang; no  appetite change and no unexpected weight change.  Eyes: Negative for eye pain and no visual disturbance.  Neck: denies neck pain; no swallowing problems CV: No chest pain, no shortness of breath, no lower extremity edema. No change in exercise tolerance Respiratory: Negative for cough or wheezing.  No shortness of breath. Gastrointestinal: Negative for abdominal pain, no diarrhea and no  constipation.  Genitourinary: Negative for decreased urine volume and  no difficulty urinating.  Musculoskeletal: Negative for arthralgias. No muscle weakness. Skin: Negative for rash.  Psychiatric/Behavioral: Negative for behavioral problems; no sleep disturbance and no  agitation.     PERTINENT  PMH / PSH: I have reviewed the patient's medications, allergies, past medical and surgical history, smoking status and updated in the EMR as appropriate.   OBJECTIVE:  Vital signs reviewed GENERALl: Well developed, well nourished, in no acute distress. HEENT: PERRLA, EOMI, sclerae are nonicteric NECK: Supple, FROM, without lymphadenopathy.  THYROID: normal without nodularity CAROTID ARTERIES: without bruits LUNGS: clear to auscultation bilaterally. No wheezes or rales. Normal respiratory effort HEART: Regular rate and rhythm, no murmurs. Distal pulses are bilaterally symmetrical, 2+. ABDOMEN: soft with positive bowel sounds. No masses noted MSK: MOE x 4. Normal muscle strength, bulk and tone.  MCP joint bilateral thumbs mildly tender to palpation with synovial hypertrophy.  Full range of  motion. SKIN no rash. Normal temperature. NEURO: no focal deficits. Normal gait. Normal balance.   ASSESSMENT / PLAN: Please see problem oriented charting for details

## 2017-10-01 NOTE — Assessment & Plan Note (Signed)
Colonoscopy next week.  Lab work today.

## 2017-10-01 NOTE — Assessment & Plan Note (Signed)
We will go ahead and give her referral to the hand surgeon so she can get an evaluation.

## 2017-10-06 ENCOUNTER — Other Ambulatory Visit: Payer: Self-pay

## 2017-10-06 ENCOUNTER — Ambulatory Visit (AMBULATORY_SURGERY_CENTER): Payer: 59 | Admitting: Gastroenterology

## 2017-10-06 ENCOUNTER — Encounter: Payer: Self-pay | Admitting: Gastroenterology

## 2017-10-06 VITALS — BP 132/57 | HR 63 | Temp 98.0°F | Resp 16 | Ht 68.0 in | Wt 251.0 lb

## 2017-10-06 DIAGNOSIS — I10 Essential (primary) hypertension: Secondary | ICD-10-CM | POA: Diagnosis not present

## 2017-10-06 DIAGNOSIS — D122 Benign neoplasm of ascending colon: Secondary | ICD-10-CM | POA: Diagnosis not present

## 2017-10-06 DIAGNOSIS — K635 Polyp of colon: Secondary | ICD-10-CM

## 2017-10-06 DIAGNOSIS — Z86010 Personal history of colonic polyps: Secondary | ICD-10-CM

## 2017-10-06 DIAGNOSIS — D120 Benign neoplasm of cecum: Secondary | ICD-10-CM

## 2017-10-06 MED ORDER — SODIUM CHLORIDE 0.9 % IV SOLN: 500.0000 mL | Freq: Once | INTRAVENOUS | Status: DC

## 2017-10-06 NOTE — Progress Notes (Signed)
Pt's states no medical or surgical changes since previsit or office visit. 

## 2017-10-06 NOTE — Op Note (Signed)
[facility] Patient Name: Kendra Thompson Procedure Date: 10/06/2017 8:59 AM MRN: [id number] Endoscopist: Ladene Artist , MD Age: 66 Referring MD:  Date of Birth: 01-Feb-1951 Gender: Female Account #: [id number] Procedure:                Colonoscopy Indications:              Surveillance: Personal history of adenomatous                            polyps on last colonoscopy 5 years ago Medicines:                Monitored Anesthesia Care Procedure:                Pre-Anesthesia Assessment:                           - Prior to the procedure, a History and Physical                            was performed, and patient medications and                            allergies were reviewed. The patient's tolerance of                            previous anesthesia was also reviewed. The risks                            and benefits of the procedure and the sedation                            options and risks were discussed with the patient.                            All questions were answered, and informed consent                            was obtained. Prior Anticoagulants: The patient has                            taken no previous anticoagulant or antiplatelet                            agents. ASA Grade Assessment: II - A patient with                            mild systemic disease. After reviewing the risks                            and benefits, the patient was deemed in                            satisfactory condition to undergo the procedure.  After obtaining informed consent, the colonoscope                            was passed under direct vision. Throughout the                            procedure, the patient's blood pressure, pulse, and                            oxygen saturations were monitored continuously. The                            Colonoscope was introduced through the anus and                            advanced to the the  cecum, identified by                            appendiceal orifice and ileocecal valve. The                            ileocecal valve, appendiceal orifice, and rectum                            were photographed. The quality of the bowel                            preparation was good. The colonoscopy was performed                            without difficulty. The patient tolerated the                            procedure well. Scope In: 9:01:52 AM Scope Out: 9:17:37 AM Scope Withdrawal Time: 0 hours 12 minutes 34 seconds  Total Procedure Duration: 0 hours 15 minutes 45 seconds  Findings:                 The perianal and digital rectal examinations were                            normal.                           A 7 mm polyp was found in the ascending colon. The                            polyp was sessile. The polyp was removed with a                            cold snare. Resection and retrieval were complete.                           A 4 mm polyp was found in the cecum. The polyp was  sessile. The polyp was removed with a cold biopsy                            forceps. Resection and retrieval were complete.                           Multiple large-mouthed diverticula were found in                            the left colon. There was no evidence of                            diverticular bleeding.                           Internal hemorrhoids were found during                            retroflexion. The hemorrhoids were small and Grade                            I (internal hemorrhoids that do not prolapse).                           The exam was otherwise without abnormality on                            direct and retroflexion views.                           A tattoo was seen in the cecum. The tattoo site                            appeared normal. Complications:            No immediate complications. Estimated blood loss:                             None. Estimated Blood Loss:     Estimated blood loss: none. Impression:               - One 7 mm polyp in the ascending colon, removed                            with a cold snare. Resected and retrieved.                           - One 4 mm polyp in the cecum, removed with a cold                            biopsy forceps. Resected and retrieved.                           - Moderate diverticulosis in the left colon. There  was no evidence of diverticular bleeding.                           - Internal hemorrhoids.                           - The examination was otherwise normal on direct                            and retroflexion views.                           - A tattoo was seen in the cecum. The tattoo site                            appeared normal. Recommendation:           - Repeat colonoscopy in 5 years for surveillance.                           - Patient has a contact number available for                            emergencies. The signs and symptoms of potential                            delayed complications were discussed with the                            patient. Return to normal activities tomorrow.                            Written discharge instructions were provided to the                            patient.                           - Resume previous diet.                           - Continue present medications.                           - Await pathology results. Ladene Artist, MD 10/06/2017 9:31:36 AM This report has been signed electronically.

## 2017-10-06 NOTE — Progress Notes (Signed)
Report to PACU, RN, vss, BBS= Clear.  

## 2017-10-06 NOTE — Patient Instructions (Signed)
Colon polyps removed today. Handouts given on: colon polyps,diverticulosis, hemorrhoids.  Result letter in your mail in 2-3 weeks. Resume current medications. Call us with any questions or concerns. Thank you!  YOU HAD AN ENDOSCOPIC PROCEDURE TODAY AT [facility]:   Refer to the procedure report that was given to you for any specific questions about what was found during the examination.  If the procedure report does not answer your questions, please call your gastroenterologist to clarify.  If you requested that your care partner not be given the details of your procedure findings, then the procedure report has been included in a sealed envelope for you to review at your convenience later.  YOU SHOULD EXPECT: Some feelings of bloating in the abdomen. Passage of more gas than usual.  Walking can help get rid of the air that was put into your GI tract during the procedure and reduce the bloating. If you had a lower endoscopy (such as a colonoscopy or flexible sigmoidoscopy) you may notice spotting of blood in your stool or on the toilet paper. If you underwent a bowel prep for your procedure, you may not have a normal bowel movement for a few days.  Please Note:  You might notice some irritation and congestion in your nose or some drainage.  This is from the oxygen used during your procedure.  There is no need for concern and it should clear up in a day or so.  SYMPTOMS TO REPORT IMMEDIATELY:   Following lower endoscopy (colonoscopy or flexible sigmoidoscopy):  Excessive amounts of blood in the stool  Significant tenderness or worsening of abdominal pains  Swelling of the abdomen that is new, acute  Fever of 100F or higher  For urgent or emergent issues, a gastroenterologist can be reached at any hour by calling [phone].   DIET:  We do recommend a small meal at first, but then you may proceed to your regular diet.  Drink plenty of fluids but you should avoid alcoholic  beverages for 24 hours.  ACTIVITY:  You should plan to take it easy for the rest of today and you should NOT DRIVE or use heavy machinery until tomorrow (because of the sedation medicines used during the test).    FOLLOW UP: Our staff will call the number listed on your records the next business day following your procedure to check on you and address any questions or concerns that you may have regarding the information given to you following your procedure. If we do not reach you, we will leave a message.  However, if you are feeling well and you are not experiencing any problems, there is no need to return our call.  We will assume that you have returned to your regular daily activities without incident.  If any biopsies were taken you will be contacted by phone or by letter within the next 1-3 weeks.  Please call us at [phone] if you have not heard about the biopsies in 3 weeks.    SIGNATURES/CONFIDENTIALITY: You and/or your care partner have signed paperwork which will be entered into your electronic medical record.  These signatures attest to the fact that that the information above on your After Visit Summary has been reviewed and is understood.  Full responsibility of the confidentiality of this discharge information lies with you and/or your care-partner.

## 2017-10-06 NOTE — Progress Notes (Signed)
Called to room to assist during endoscopic procedure.  Patient ID and intended procedure confirmed with present staff. Received instructions for my participation in the procedure from the performing physician.  

## 2017-10-07 ENCOUNTER — Telehealth: Payer: Self-pay | Admitting: *Deleted

## 2017-10-07 NOTE — Telephone Encounter (Signed)
  Follow up Call-  Call back number 10/06/2017  Post procedure Call Back phone  # [phone]  Permission to leave phone message Yes  Some recent data might be hidden     Patient questions:  Do you have a fever, pain , or abdominal swelling? No. Pain Score  0 *  Have you tolerated food without any problems? Yes.    Have you been able to return to your normal activities? Yes.    Do you have any questions about your discharge instructions: Diet   No. Medications  No. Follow up visit  No.  Do you have questions or concerns about your Care? No.  Actions: * If pain score is 4 or above: No action needed, pain <4.

## 2017-10-08 ENCOUNTER — Encounter: Payer: Self-pay | Admitting: Family Medicine

## 2017-10-11 ENCOUNTER — Encounter: Payer: Self-pay | Admitting: Family Medicine

## 2017-10-22 ENCOUNTER — Encounter: Payer: Self-pay | Admitting: Gastroenterology

## 2017-10-27 NOTE — Telephone Encounter (Signed)
Colon done on 10-06-17

## 2017-11-22 ENCOUNTER — Other Ambulatory Visit: Payer: Self-pay | Admitting: Family Medicine

## 2017-11-22 MED FILL — AMLODIPINE BESYLATE 10 MG T: 10 | 90 days supply | Qty: 90 | Fill #0

## 2017-11-22 MED FILL — LOSARTAN-HCTZ 100-25 MG TAB: 100-25 | 90 days supply | Qty: 90 | Fill #0

## 2017-12-14 ENCOUNTER — Telehealth: Payer: Self-pay | Admitting: Family Medicine

## 2017-12-14 NOTE — Telephone Encounter (Signed)
Pt needs a referral from Dr. Nori Riis to be seen by [facility]. Needs to be done as soon as possible since her appointment is this Friday 2/22.

## 2017-12-15 NOTE — Telephone Encounter (Signed)
PCLM for pt to call the office. If she calls, please give her the info below. Ottis Stain, CMA

## 2017-12-15 NOTE — Telephone Encounter (Signed)
Dear Dema Severin Team She shouldn't need a referral as it is part of same tax ID. If she does, I will take care of it when I see her Friday THANKS! Dorcas Mcmurray

## 2017-12-17 ENCOUNTER — Ambulatory Visit: Payer: No Typology Code available for payment source | Admitting: Family Medicine

## 2017-12-17 ENCOUNTER — Encounter: Payer: Self-pay | Admitting: Family Medicine

## 2017-12-17 DIAGNOSIS — M25561 Pain in right knee: Secondary | ICD-10-CM

## 2017-12-17 DIAGNOSIS — M25562 Pain in left knee: Secondary | ICD-10-CM | POA: Diagnosis not present

## 2017-12-17 DIAGNOSIS — I10 Essential (primary) hypertension: Secondary | ICD-10-CM | POA: Diagnosis not present

## 2017-12-17 DIAGNOSIS — G8929 Other chronic pain: Secondary | ICD-10-CM

## 2017-12-17 MED ORDER — METHYLPREDNISOLONE ACETATE 40 MG/ML IJ SUSP
40.0000 mg | Freq: Once | INTRAMUSCULAR | Status: AC
  Administered 2017-12-17: 40 mg via INTRA_ARTICULAR

## 2017-12-20 ENCOUNTER — Encounter: Payer: Self-pay | Admitting: Family Medicine

## 2017-12-20 NOTE — Assessment & Plan Note (Signed)
Blood pressure elevated today.  Take 3 readings if they continue to be elevated, let me know.  Because her knee is hurting so bad because she rushed to get here.

## 2017-12-20 NOTE — Progress Notes (Signed)
    CHIEF COMPLAINT / HPI: #1.  Bilateral knee pain.  Warmth to injection.  Pain 7 out of 10 right greater than left.  Worse with climbing stairs, periods of standing.  Aching in nature.  Last injection helped a lot up until about 6 weeks ago.  No swelling. Marland Kitchen@ Weight is up and she is frustrated 3.  Taking BP meds regularly  4.  Saw the hand surgeon and then gave her some options including possible joint replacement.  She is still thinking about it.  Is likely she will wait as long as possible to have joint replacement per her current thoughts.  REVIEW OF SYSTEMS: No locking or giving way of the knee. No chest pain or SOB. No LE edema  PERTINENT  PMH / PSH: I have reviewed the patient's medications, allergies, past medical and surgical history, smoking status and updated in the EMR as appropriate.   OBJECTIVE: GEN: Well-developed female no acute distress. Vitals reviewed KNEES full range of motion flexion extension.  Some pain at the extreme ranges of flexion bilaterally.  Patella mobile.  Mild crepitus bilaterally on extension.  Medial joint line tenderness bilaterally.  No effusion. Igamentously intact to varus and valgus stress Skin: No erythema, no rash on the area around the knee.   VASCULAR: Dorsalis pedis pulses 2+ bilaterally symmetrical  NEURO: Intact sensation soft touch bilateral lower extremity. CV RRR Ext no edmea B LE  PROCEDURE: INJECTION: Patient was given informed consent, signed copy in the chart. Appropriate time out was taken. Area prepped and draped in usual sterile fashion. Ethyl chloride was  used for local anesthesia. A 21 gauge 1 1/2 inch needle was used.. 1 cc of methylprednisolone 40 mg/ml plus  4 cc of 1% lidocaine without epinephrine was injected into the Biltaeral knee joints  using a(n) anterior medial approach.   The patient tolerated the procedure well. There were no complications. Post procedure instructions were given.   ASSESSMENT / PLAN: Please see  problem oriented charting for details

## 2018-01-06 ENCOUNTER — Encounter: Payer: Self-pay | Admitting: Family Medicine

## 2018-01-07 ENCOUNTER — Other Ambulatory Visit: Payer: Self-pay | Admitting: Family Medicine

## 2018-01-07 DIAGNOSIS — Z1239 Encounter for other screening for malignant neoplasm of breast: Secondary | ICD-10-CM

## 2018-01-25 ENCOUNTER — Encounter: Payer: Self-pay | Admitting: Family Medicine

## 2018-01-26 ENCOUNTER — Ambulatory Visit
Admission: RE | Admit: 2018-01-26 | Discharge: 2018-01-26 | Disposition: A | Discharge status: Home / Self Care | Payer: No Typology Code available for payment source | Source: Ambulatory Visit | Attending: Family Medicine | Admitting: Family Medicine

## 2018-01-26 DIAGNOSIS — Z1239 Encounter for other screening for malignant neoplasm of breast: Secondary | ICD-10-CM

## 2018-03-01 MED FILL — AMLODIPINE BESYLATE 10 MG T: 10 | 90 days supply | Qty: 90 | Fill #1

## 2018-03-01 MED FILL — LOSARTAN-HCTZ 100-25 MG TAB: 100-25 | 90 days supply | Qty: 90 | Fill #1

## 2018-04-07 ENCOUNTER — Encounter: Payer: Self-pay | Admitting: Family Medicine

## 2018-04-08 ENCOUNTER — Other Ambulatory Visit: Payer: Self-pay | Admitting: Family Medicine

## 2018-04-08 MED ORDER — CANDESARTAN CILEXETIL-HCTZ 32-25 MG PO TABS: ORAL_TABLET | ORAL | 3 refills | Status: DC

## 2018-04-08 MED FILL — CANDESARTAN-HCTZ 32-25 MG T: 32-25 | 90 days supply | Qty: 90 | Fill #0

## 2018-06-08 ENCOUNTER — Encounter: Payer: Self-pay | Admitting: Family Medicine

## 2018-06-08 ENCOUNTER — Other Ambulatory Visit: Payer: Self-pay

## 2018-06-08 ENCOUNTER — Ambulatory Visit (INDEPENDENT_AMBULATORY_CARE_PROVIDER_SITE_OTHER): Payer: No Typology Code available for payment source | Admitting: Family Medicine

## 2018-06-08 VITALS — BP 142/70 | HR 82 | Temp 98.0°F | Ht 68.0 in | Wt 251.8 lb

## 2018-06-08 DIAGNOSIS — M1810 Unilateral primary osteoarthritis of first carpometacarpal joint, unspecified hand: Secondary | ICD-10-CM | POA: Diagnosis not present

## 2018-06-08 DIAGNOSIS — R079 Chest pain, unspecified: Secondary | ICD-10-CM | POA: Diagnosis not present

## 2018-06-08 MED ORDER — NITROGLYCERIN 0.4 MG SL SUBL: 0.4000 mg | SUBLINGUAL_TABLET | SUBLINGUAL | 1 refills | Status: DC | PRN

## 2018-06-08 MED ORDER — PANTOPRAZOLE SODIUM 40 MG PO TBEC: 40.0000 mg | DELAYED_RELEASE_TABLET | Freq: Every day | ORAL | 0 refills | Status: DC

## 2018-06-08 MED ORDER — TRAMADOL HCL 50 MG PO TABS: 50.0000 mg | ORAL_TABLET | Freq: Four times a day (QID) | ORAL | 0 refills | Status: AC | PRN

## 2018-06-08 MED ORDER — DICLOFENAC SODIUM 1 % TD GEL: 2.0000 g | Freq: Four times a day (QID) | TRANSDERMAL | 2 refills | Status: DC

## 2018-06-08 MED FILL — NITROGLYCERIN 0.4 MG TAB SL: 0.4 | 20 days supply | Qty: 25 | Fill #0

## 2018-06-08 MED FILL — PANTOPRAZOLE SOD DR 40 MG T: 40 | 90 days supply | Qty: 90 | Fill #0

## 2018-06-08 MED FILL — traMADol HCL 50 MG TABS: 50 | 5 days supply | Qty: 20 | Fill #0

## 2018-06-08 MED FILL — DICLOFENAC SODIUM 1% GEL: 1 | 13 days supply | Qty: 100 | Fill #0

## 2018-06-08 NOTE — Patient Instructions (Signed)
Take the pantoprozole daily Try the gel and the tramadol for pain in yoru hands IF you have not heard from cardiology in next 2-3 days, call me IF you have any more symptoms, I need to know

## 2018-06-10 DIAGNOSIS — R079 Chest pain, unspecified: Secondary | ICD-10-CM | POA: Insufficient documentation

## 2018-06-10 NOTE — Assessment & Plan Note (Signed)
She has been having some continued arthritic pain of her thumbs.  She has been taking some ibuprofen but with this most recent episode of chest pain she had discontinued that.  I will give her some diclofenac topical gel to see if that will help.  She can also use Tylenol.

## 2018-06-10 NOTE — Assessment & Plan Note (Signed)
Long discussion about her episodes.  It does not sound typically anginal but I think we need to work so we will refer her to cardiology for should she have any other unusual episodes, tablets instructions to go to the emergency room is currently on aspirin daily.  Also I have given her a prescription for Protonix as her symptoms seem really consistent with reflux. Spent greater than 50% of our 25-minute office visit in counseling education regarding possible etiologies of the chest pain, possible work-up, warning signs and treatment plan.

## 2018-06-10 NOTE — Progress Notes (Signed)
    CHIEF COMPLAINT / HPI: 2 episodes of chest pain.  Substernal.  Felt like a knife sticking in her chest.  Both were associated with increased stressors related to moving her household.  No exertion was ongoing at the time she had pain, just emotional stress.  Each episode lasted 5 to 15 minutes.  When first when she took some aspirin resolved and she is not sure if it was related to the aspirin the second resolved before she could even take aspirin she has not had any episodes of chest pain while doing exercise although she has not been exercising much.  With the above-mentioned episodes she did not have any nausea, no sweating, no impending sense of doing, no back pain.  Chest pain felt a little bit like a bad reflux  Stressors are related to their selling their house and moving to apartment.  REVIEW OF SYSTEMS: See HPI.  Additional pertinent review of systems is negative for shortness of breath, no lower extremity edema, no dyspnea on exertion, no PND.  No headache.  She has felt a little more stressed recently.  PERTINENT  PMH / PSH: I have reviewed the patient's medications, allergies, past medical and surgical history, smoking status and updated in the EMR as appropriate.   OBJECTIVE:  Vital signs reviewed. GENERAL: Well-developed, well-nourished, no acute distress. CARDIOVASCULAR: Regular rate and rhythm no murmur gallop or rub LUNGS: Clear to auscultation bilaterally, no rales or wheeze. ABDOMEN: Soft positive bowel sounds NEURO: No gross focal neurological deficits. MSK: Movement of extremity x 4.    ASSESSMENT / PLAN:  Chest pain Long discussion about her episodes.  It does not sound typically anginal but I think we need to work so we will refer her to cardiology for should she have any other unusual episodes, tablets instructions to go to the emergency room is currently on aspirin daily.  Also I have given her a prescription for Protonix as her symptoms seem really consistent  with reflux. Spent greater than 50% of our 25-minute office visit in counseling education regarding possible etiologies of the chest pain, possible work-up, warning signs and treatment plan.  Degenerative arthritis of thumb She has been having some continued arthritic pain of her thumbs.  She has been taking some ibuprofen but with this most recent episode of chest pain she had discontinued that.  I will give her some diclofenac topical gel to see if that will help.  She can also use Tylenol.

## 2018-06-14 MED FILL — AMLODIPINE BESYLATE 10 MG T: 10 | 90 days supply | Qty: 90 | Fill #2

## 2018-07-11 MED FILL — CANDESARTAN-HCTZ 32-25 MG T: 32-25 | 90 days supply | Qty: 90 | Fill #1

## 2018-07-13 DIAGNOSIS — R69 Illness, unspecified: Secondary | ICD-10-CM | POA: Diagnosis not present

## 2018-08-01 NOTE — Progress Notes (Signed)
Kendra Radon, MD Reason for referral-chest pain  HPI: 67 year old female for evaluation of chest pain at request of Dorcas Mcmurray, MD. Patient has had 2 separate episodes of chest pain recently.  The first several weeks ago.  There was a small area in the substernal area described as a hard pain.  Occurred suddenly and lasted approximately 15 minutes.  No radiation or associated symptoms.  Not pleuritic or positional.  She had a second episode that was less severe.  She otherwise denies dyspnea on exertion, orthopnea, PND, pedal edema, exertional chest pain or syncope.  She was started on Protonix and has had no further symptoms.  Audiology asked to evaluate.  Current Outpatient Medications  Medication Sig Dispense Refill  . amLODipine (NORVASC) 10 MG tablet TAKE 1 TABLET BY MOUTH ONCE DAILY 90 tablet 3  . aspirin 81 MG tablet Take 81 mg by mouth daily.    . Candesartan Cilexetil-HCTZ 32-25 MG TABS Take one by mouth daily 90 each 3  . nitroGLYCERIN (NITROSTAT) 0.4 MG SL tablet Place 1 tablet (0.4 mg total) under the tongue every 5 (five) minutes as needed for chest pain. 20 tablet 1  . pantoprazole (PROTONIX) 40 MG tablet Take 1 tablet (40 mg total) by mouth daily. 90 tablet 0  . diclofenac sodium (VOLTAREN) 1 % GEL Apply 2 g topically 4 (four) times daily. (Patient not taking: Reported on 08/02/2018) 100 g 2   No current facility-administered medications for this visit.     Allergies  Allergen Reactions  . Tape Rash     Past Medical History:  Diagnosis Date  . GERD (gastroesophageal reflux disease)   . Hypertension   . Seasonal allergies   . Wears glasses     Past Surgical History:  Procedure Laterality Date  . CLOSED REDUCTION FINGER WITH PERCUTANEOUS PINNING Left 07/25/2013   Procedure: CLOSED REDUCTION FINGER WITH PERCUTANEOUS PINNING LEFT SMALL METACARPAL;  Surgeon: Tennis Must, MD;  Location: [facility];  Service: Orthopedics;  Laterality: Left;  .  COLONOSCOPY  [id number]  . DIAGNOSTIC LAPAROSCOPY  1992   explor-  . DILATION AND CURETTAGE OF UTERUS      Social History   Socioeconomic History  . Marital status: Married    Spouse name: Not on file  . Number of children: Not on file  . Years of education: Not on file  . Highest education level: Not on file  Occupational History  . Not on file  Social Needs  . Financial resource strain: Not on file  . Food insecurity:    Worry: Not on file    Inability: Not on file  . Transportation needs:    Medical: Not on file    Non-medical: Not on file  Tobacco Use  . Smoking status: Never Smoker  . Smokeless tobacco: Never Used  Substance and Sexual Activity  . Alcohol use: No  . Drug use: No  . Sexual activity: Not on file  Lifestyle  . Physical activity:    Days per week: Not on file    Minutes per session: Not on file  . Stress: Not on file  Relationships  . Social connections:    Talks on phone: Not on file    Gets together: Not on file    Attends religious service: Not on file    Active member of club or organization: Not on file    Attends meetings of clubs or organizations: Not on file    Relationship status:  Not on file  . Intimate partner violence:    Fear of current or ex partner: Not on file    Emotionally abused: Not on file    Physically abused: Not on file    Forced sexual activity: Not on file  Other Topics Concern  . Not on file  Social History Narrative  . Not on file    Family History  Problem Relation Age of Onset  . Breast cancer Maternal Grandmother        unsure of age  . Breast cancer Mother        mid 55's  . CAD Father        Diagnosed in early 2s  . Stomach cancer Brother   . Colon cancer Neg Hx     ROS: no fevers or chills, productive cough, hemoptysis, dysphasia, odynophagia, melena, hematochezia, dysuria, hematuria, rash, seizure activity, orthopnea, PND, pedal edema, claudication. Remaining systems are negative.  Physical Exam:    Blood pressure (!) 142/74, pulse 81, height 5\' 8"  (1.727 m), weight 248 lb (112.5 kg).  General:  Well developed/well nourished in NAD Skin warm/dry Patient not depressed No peripheral clubbing Back-normal HEENT-normal/normal eyelids Neck supple/normal carotid upstroke bilaterally; no bruits; no JVD; no thyromegaly chest - CTA/ normal expansion CV - RRR/normal S1 and S2; no murmurs, rubs or gallops;  PMI nondisplaced Abdomen -NT/ND, no HSM, no mass, + bowel sounds, no bruit 2+ femoral pulses, no bruits Ext-no edema, chords, 2+ DP Neuro-grossly nonfocal  ECG -July 25, 2013-sinus rhythm with no ST changes.  Personally reviewed  Today's electrocardiogram-normal sinus rhythm at a rate of 81.  No ST changes.  Personally reviewed.  A/P  1 chest-symptoms are atypical.  Electrocardiogram is normal.  We discussed further evaluation including exercise treadmill versus observation.  She would prefer to be conservative at this point.  She will contact as if she has any more symptoms and we will pursue further evaluation at that time.  She thinks some of her symptoms may have been stress related.  Also note no recurrent symptoms following addition of Protonix.  2 hypertension-blood pressure mildly elevated.  I have asked her to follow-up with primary care for this issue.  Continue present medications and follow.  Kirk Ruths, MD

## 2018-08-02 ENCOUNTER — Ambulatory Visit: Payer: Medicare HMO | Admitting: Cardiology

## 2018-08-02 ENCOUNTER — Encounter: Payer: Self-pay | Admitting: Cardiology

## 2018-08-02 VITALS — BP 142/74 | HR 81 | Ht 68.0 in | Wt 248.0 lb

## 2018-08-02 DIAGNOSIS — I10 Essential (primary) hypertension: Secondary | ICD-10-CM

## 2018-08-02 DIAGNOSIS — R079 Chest pain, unspecified: Secondary | ICD-10-CM

## 2018-08-02 NOTE — Patient Instructions (Signed)
Medication Instructions:  Your Physician recommend you continue on your current medication as directed.    If you need a refill on your cardiac medications before your next appointment, please call your pharmacy.   Lab work: None  Testing/Procedures: None  Follow-Up: At [facility], you and your health needs are our priority.  As part of our continuing mission to provide you with exceptional heart care, we have created designated Provider Care Teams.  These Care Teams include your primary Cardiologist (physician) and Advanced Practice Providers (APPs -  Physician Assistants and Nurse Practitioners) who all work together to provide you with the care you need, when you need it. You will need a follow up appointment as needed. Please call our office 2 months in advance to schedule this appointment.  You may see Dr. Stanford Breed or one of the following Advanced Practice Providers on your designated Care Team:   Kerin Ransom, PA-C Roby Lofts, [place] . Sande Rives, PA-C  Any Other Special Instructions Will Be Listed Below (If Applicable).

## 2018-08-04 DIAGNOSIS — R69 Illness, unspecified: Secondary | ICD-10-CM | POA: Diagnosis not present

## 2018-09-05 ENCOUNTER — Other Ambulatory Visit: Payer: Self-pay | Admitting: Family Medicine

## 2018-09-06 MED FILL — PANTOPRAZOLE SOD DR 40 MG T: 40 | 90 days supply | Qty: 90 | Fill #0

## 2018-09-19 MED FILL — AMLODIPINE BESYLATE 10 MG T: 10 | 90 days supply | Qty: 90 | Fill #3

## 2018-10-21 MED FILL — CANDESARTAN-HCTZ 32-25 MG T: 32-25 | 90 days supply | Qty: 90 | Fill #2

## 2018-10-26 DIAGNOSIS — H269 Unspecified cataract: Secondary | ICD-10-CM

## 2018-10-26 HISTORY — DX: Unspecified cataract: H26.9

## 2018-10-27 ENCOUNTER — Ambulatory Visit (INDEPENDENT_AMBULATORY_CARE_PROVIDER_SITE_OTHER): Payer: Medicare HMO | Admitting: Family Medicine

## 2018-10-27 ENCOUNTER — Other Ambulatory Visit: Payer: Self-pay

## 2018-10-27 ENCOUNTER — Encounter: Payer: Self-pay | Admitting: Family Medicine

## 2018-10-27 VITALS — BP 138/74 | HR 79 | Temp 98.7°F | Ht 68.0 in | Wt 256.4 lb

## 2018-10-27 DIAGNOSIS — Z23 Encounter for immunization: Secondary | ICD-10-CM | POA: Diagnosis not present

## 2018-10-27 DIAGNOSIS — Z0000 Encounter for general adult medical examination without abnormal findings: Secondary | ICD-10-CM

## 2018-10-27 DIAGNOSIS — R7309 Other abnormal glucose: Secondary | ICD-10-CM

## 2018-10-27 LAB — POCT GLYCOSYLATED HEMOGLOBIN (HGB A1C): HBA1C, POC (PREDIABETIC RANGE): 5.9 % (ref 5.7–6.4)

## 2018-10-27 NOTE — Patient Instructions (Signed)
Great to see you! If you knees begin to hurt more, let me know. Enjoy your retirement!

## 2018-10-28 ENCOUNTER — Encounter: Payer: Self-pay | Admitting: Family Medicine

## 2018-10-28 LAB — CMP14+EGFR
ALK PHOS: 81 IU/L (ref 39–117)
ALT: 30 IU/L (ref 0–32)
AST: 20 IU/L (ref 0–40)
Albumin/Globulin Ratio: 1.7 (ref 1.2–2.2)
Albumin: 4.4 g/dL (ref 3.6–4.8)
BUN/Creatinine Ratio: 23 (ref 12–28)
BUN: 19 mg/dL (ref 8–27)
Bilirubin Total: 0.4 mg/dL (ref 0.0–1.2)
CO2: 23 mmol/L (ref 20–29)
CREATININE: 0.83 mg/dL (ref 0.57–1.00)
Calcium: 9.7 mg/dL (ref 8.7–10.3)
Chloride: 101 mmol/L (ref 96–106)
GFR calc Af Amer: 86 mL/min/{1.73_m2} (ref >59)
GFR calc non Af Amer: 74 mL/min/{1.73_m2} (ref >59)
Globulin, Total: 2.6 g/dL (ref 1.5–4.5)
Glucose: 110 mg/dL — ABNORMAL HIGH (ref 65–99)
Potassium: 4.4 mmol/L (ref 3.5–5.2)
SODIUM: 140 mmol/L (ref 134–144)
Total Protein: 7 g/dL (ref 6.0–8.5)

## 2018-10-28 LAB — LIPID PANEL
Chol/HDL Ratio: 4.7 ratio — ABNORMAL HIGH (ref 0.0–4.4)
Cholesterol, Total: 200 mg/dL — ABNORMAL HIGH (ref 100–199)
HDL: 43 mg/dL (ref >39)
LDL CALC: 133 mg/dL — AB (ref 0–99)
TRIGLYCERIDES: 121 mg/dL (ref 0–149)
VLDL Cholesterol Cal: 24 mg/dL (ref 5–40)

## 2018-10-28 NOTE — Assessment & Plan Note (Signed)
Recheck labs today including A1c

## 2018-10-28 NOTE — Progress Notes (Signed)
    CHIEF COMPLAINT / HPI: The PE Has retired.  Doing well.  She continues to have some joint pains.  She did see the hand surgeon and he gave her an injection which helped for about 2 months.  She is also having knee pain, particularly when she uses stairs.  Does not really want to do anything about it at this time.  Had an injection a couple of years ago and it did not really seem to help much.  No more episodes of chest discomfort.   REVIEW OF SYSTEMS: Review of Systems  Constitutional: Negative for activity chang; no  appetite change and no unexpected weight change.  Eyes: Negative for eye pain and no visual disturbance.  Neck: denies neck pain; no swallowing problems CV: No chest pain, no shortness of breath, no lower extremity edema. No change in exercise tolerance Respiratory: Negative for cough or wheezing.  No shortness of breath. Gastrointestinal: Negative for abdominal pain, no diarrhea and no  constipation.  Genitourinary: Negative for decreased urine volume and  no difficulty urinating.  Musculoskeletal: Knee and thumb joint pain as per HPI No muscle weakness. Skin: Negative for rash.  Psychiatric/Behavioral: Negative for behavioral problems; no sleep disturbance and no  agitation.     PERTINENT  PMH / PSH: I have reviewed the patient's medications, allergies, past medical and surgical history, smoking status and updated in the EMR as appropriate.   OBJECTIVE: Vital signs reviewed GENERALl: Well developed, well nourished, in no acute distress. HEENT: PERRLA, EOMI, sclerae are nonicteric NECK: Supple, FROM, without lymphadenopathy.  THYROID: normal without nodularity CAROTID ARTERIES: without bruits LUNGS: clear to auscultation bilaterally. No wheezes or rales. Normal respiratory effort HEART: Regular rate and rhythm, no murmurs. Distal pulses are bilaterally symmetrical, 2+. ABDOMEN: soft with positive bowel sounds. No masses noted MSK: MOE x 4. Normal muscle  strength, bulk and tone. SKIN no rash. Normal temperature. NEURO: no focal deficits. Normal gait. Normal balance.   ASSESSMENT / PLAN:  No problem-specific Assessment & Plan notes found for this encounter.

## 2018-10-28 NOTE — Assessment & Plan Note (Signed)
Discussed Pap smear.  She had one at 73.  Given option today and she declines.  Up-to-date on her other health maintenance.  We will get some labs today.  Discussed exercise.

## 2018-12-12 ENCOUNTER — Other Ambulatory Visit: Payer: Self-pay | Admitting: Family Medicine

## 2018-12-12 MED FILL — PANTOPRAZOLE SOD DR 40 MG T: 40 | 90 days supply | Qty: 90 | Fill #0

## 2018-12-12 MED FILL — AMLODIPINE BESYLATE 10 MG T: 10 | 90 days supply | Qty: 90 | Fill #0

## 2019-02-28 ENCOUNTER — Other Ambulatory Visit: Payer: Self-pay | Admitting: Family Medicine

## 2019-02-28 DIAGNOSIS — Z1231 Encounter for screening mammogram for malignant neoplasm of breast: Secondary | ICD-10-CM

## 2019-03-07 ENCOUNTER — Ambulatory Visit
Admission: RE | Admit: 2019-03-07 | Discharge: 2019-03-07 | Disposition: A | Discharge status: Home / Self Care | Payer: Medicare HMO | Source: Ambulatory Visit | Attending: Family Medicine | Admitting: Family Medicine

## 2019-03-07 ENCOUNTER — Other Ambulatory Visit: Payer: Self-pay

## 2019-03-07 DIAGNOSIS — Z1231 Encounter for screening mammogram for malignant neoplasm of breast: Secondary | ICD-10-CM

## 2019-04-12 ENCOUNTER — Other Ambulatory Visit: Payer: Self-pay | Admitting: Family Medicine

## 2019-06-02 ENCOUNTER — Ambulatory Visit (INDEPENDENT_AMBULATORY_CARE_PROVIDER_SITE_OTHER): Payer: Medicare HMO | Admitting: Family Medicine

## 2019-06-02 ENCOUNTER — Other Ambulatory Visit: Payer: Self-pay

## 2019-06-02 DIAGNOSIS — M7742 Metatarsalgia, left foot: Secondary | ICD-10-CM

## 2019-06-02 DIAGNOSIS — M25572 Pain in left ankle and joints of left foot: Secondary | ICD-10-CM

## 2019-06-05 DIAGNOSIS — M7742 Metatarsalgia, left foot: Secondary | ICD-10-CM | POA: Insufficient documentation

## 2019-06-05 NOTE — Progress Notes (Signed)
  Kendra Thompson - 68 y.o. female MRN [id number]  Date of birth: November 18, 1950    SUBJECTIVE:      Chief Complaint:/ HPI:  Left foot pain.  Worsening over the last 2 to 4 weeks.  Has started a walking program and is having a lot of success with improvement in mood, decreasing stress, improvement in weight.  She is also doing the NOOM program for weight loss and is quite happy with that. Foot pain is in the first joint, big toe.  Also a little bit on the metatarsal head area of the first and second joint.  She feels like it is her bunion is causing problems is that rubbing against the shoe but she also has quite a bit of pain with standing.   ROS:     Mild weight loss that is intentional.  No fever.  No other unusual arthralgias.  PERTINENT  PMH / PSH FH / / SH:  Past Medical, Surgical, Social, and Family History Reviewed & Updated in the EMR.  Pertinent findings include:  Non-smoker   OBJECTIVE: BP 140/76   Ht 5\' 8"  (1.727 m)   Wt 233 lb (105.7 kg)   BMI 35.43 kg/m   Physical Exam:  Vital signs are reviewed. GENERAL: Well-developed female overweight, no acute distress ANKLES: Full range of motion.  Nontender.  No swelling no effusion. FEET: Bilaterally she has slight first ray deviation with bunion formation on both feet.  Pes planus bilaterally.  Left foot is tender to palpation under the first MTP joint and somewhat tender in the area between the first and second ray metatarsal heads. Skin: There is no unusual callus formation.  No erythema.  No lesions. VASCULAR: Dorsalis pedis and posterior tibial pulses are 2+ bilaterally are symmetrical  ASSESSMENT & PLAN:  Metatarsalgia: We will try her in some green insoles with metatarsal pad on the left.  We will also give her scaphoid pads for both.  She said these immediately felt somewhat better when she tried them in the clinic.  If this resolves her issues, she can return PRN.  If she is not having significant improvement in 4 weeks,  return to clinic.  Would also consider a CSI of her left first MTP as there is quite a bit of inflammation in that joint and it may not resolve on its own.

## 2019-06-09 DIAGNOSIS — H2513 Age-related nuclear cataract, bilateral: Secondary | ICD-10-CM | POA: Diagnosis not present

## 2019-06-09 DIAGNOSIS — H35013 Changes in retinal vascular appearance, bilateral: Secondary | ICD-10-CM | POA: Diagnosis not present

## 2019-06-09 DIAGNOSIS — H524 Presbyopia: Secondary | ICD-10-CM | POA: Diagnosis not present

## 2019-06-09 DIAGNOSIS — H35433 Paving stone degeneration of retina, bilateral: Secondary | ICD-10-CM | POA: Diagnosis not present

## 2019-06-09 DIAGNOSIS — H35033 Hypertensive retinopathy, bilateral: Secondary | ICD-10-CM | POA: Diagnosis not present

## 2019-06-14 DIAGNOSIS — R69 Illness, unspecified: Secondary | ICD-10-CM | POA: Diagnosis not present

## 2019-06-28 DIAGNOSIS — M79641 Pain in right hand: Secondary | ICD-10-CM | POA: Diagnosis not present

## 2019-06-28 DIAGNOSIS — M180 Bilateral primary osteoarthritis of first carpometacarpal joints: Secondary | ICD-10-CM | POA: Diagnosis not present

## 2019-06-28 DIAGNOSIS — M79642 Pain in left hand: Secondary | ICD-10-CM | POA: Diagnosis not present

## 2019-07-18 DIAGNOSIS — R69 Illness, unspecified: Secondary | ICD-10-CM | POA: Diagnosis not present

## 2019-08-03 ENCOUNTER — Encounter: Payer: Self-pay | Admitting: Family Medicine

## 2019-09-28 ENCOUNTER — Telehealth (INDEPENDENT_AMBULATORY_CARE_PROVIDER_SITE_OTHER): Payer: Medicare HMO | Admitting: Family Medicine

## 2019-09-28 ENCOUNTER — Other Ambulatory Visit: Payer: Self-pay

## 2019-09-28 DIAGNOSIS — J069 Acute upper respiratory infection, unspecified: Secondary | ICD-10-CM | POA: Diagnosis not present

## 2019-09-28 MED ORDER — AMOXICILLIN 500 MG PO CAPS: 500.0000 mg | ORAL_CAPSULE | Freq: Three times a day (TID) | ORAL | 0 refills | Status: AC

## 2019-09-28 NOTE — Progress Notes (Signed)
[facility] Tele[facility] Visit  Patient consented to have visit conducted via video phone call. Two separate patient identifiers used to verify identity.  Encounter participants: Patient: Kendra Thompson  Provider: Dorcas Mcmurray  Others (if applicable): none  Chief Complaint / HPI:  Sinus congestion with runny nose.  No fever.  Nasal drainage is clear.  Head feels very stuffed up.  No cough.  States she would like to get ahead of this to keep it from going into her chest.  She has had no sick contacts that she is aware of.  She has intact sense of smell and taste.  No myalgias.  ROS: No chest pain.  No unusual weight change No abdominal pain  OBJECTIVE observed via telephone device: PSYCH: AxOx4.  Appropriate speech fluency and content. Asks and answers questions appropriately. Mood is congruent. RESPIRATORY: no unusual sounds of labored breathing.  HEENT: She does sound rather congested    Pertinent PMHx / PSHx:  I have reviewed the patient's medications, allergies, past medical and surgical history, smoking status and updated in the EMR as appropriate.   Assessment/Plan:  #1.  Viral URI is most likely.  We discussed options.  She would like to have antibiotic on hand in case this starts to get worse.  We discussed symptoms of Covid and what to do if she develops fever cough etc.  I will call in amoxicillin 7 days 3 times daily in case she has worsening of symptoms including fever, green nasal discharge etc.  Otherwise she will return to regular follow-up.

## 2019-11-29 DIAGNOSIS — M79642 Pain in left hand: Secondary | ICD-10-CM | POA: Diagnosis not present

## 2019-11-29 DIAGNOSIS — M79641 Pain in right hand: Secondary | ICD-10-CM | POA: Diagnosis not present

## 2019-11-29 DIAGNOSIS — M180 Bilateral primary osteoarthritis of first carpometacarpal joints: Secondary | ICD-10-CM | POA: Diagnosis not present

## 2019-12-04 ENCOUNTER — Other Ambulatory Visit: Payer: Self-pay | Admitting: *Deleted

## 2019-12-05 MED ORDER — PANTOPRAZOLE SODIUM 40 MG PO TBEC: 40.0000 mg | DELAYED_RELEASE_TABLET | Freq: Every day | ORAL | 3 refills | Status: DC

## 2019-12-05 MED ORDER — AMLODIPINE BESYLATE 10 MG PO TABS: 10.0000 mg | ORAL_TABLET | Freq: Every day | ORAL | 3 refills | Status: DC

## 2019-12-27 ENCOUNTER — Encounter: Payer: Self-pay | Admitting: Family Medicine

## 2019-12-27 ENCOUNTER — Other Ambulatory Visit: Payer: Self-pay

## 2019-12-27 ENCOUNTER — Ambulatory Visit (INDEPENDENT_AMBULATORY_CARE_PROVIDER_SITE_OTHER): Payer: Medicare HMO | Admitting: Family Medicine

## 2019-12-27 VITALS — BP 130/72 | HR 77 | Wt 254.4 lb

## 2019-12-27 DIAGNOSIS — K219 Gastro-esophageal reflux disease without esophagitis: Secondary | ICD-10-CM

## 2019-12-27 DIAGNOSIS — Z780 Asymptomatic menopausal state: Secondary | ICD-10-CM

## 2019-12-27 DIAGNOSIS — I10 Essential (primary) hypertension: Secondary | ICD-10-CM

## 2019-12-27 DIAGNOSIS — M1810 Unilateral primary osteoarthritis of first carpometacarpal joint, unspecified hand: Secondary | ICD-10-CM | POA: Diagnosis not present

## 2019-12-27 DIAGNOSIS — Z0000 Encounter for general adult medical examination without abnormal findings: Secondary | ICD-10-CM

## 2019-12-27 NOTE — Patient Instructions (Addendum)
Watch your BP readings and if you are getting in the 130 range on your wrist monitor let me know.  For activity, focus on fitness and have a goal of about 30-40 minutes a day most days of the week.   I have sent Ia Rx in for your shingles shot and you can get that done at your pharmacy. It is a 2 dose vaccine. I would wait 30 days after any other vaccine   Great to see you!

## 2019-12-29 ENCOUNTER — Other Ambulatory Visit: Payer: Self-pay | Admitting: Family Medicine

## 2019-12-29 DIAGNOSIS — Z0000 Encounter for general adult medical examination without abnormal findings: Secondary | ICD-10-CM

## 2019-12-29 DIAGNOSIS — K219 Gastro-esophageal reflux disease without esophagitis: Secondary | ICD-10-CM | POA: Insufficient documentation

## 2019-12-29 DIAGNOSIS — Z780 Asymptomatic menopausal state: Secondary | ICD-10-CM | POA: Insufficient documentation

## 2019-12-29 DIAGNOSIS — R7309 Other abnormal glucose: Secondary | ICD-10-CM

## 2019-12-29 NOTE — Assessment & Plan Note (Signed)
She has been on Protonix and has been asymptomatic.  We will try weaning her off that.  If symptoms occur more than 2-3 times a week, she will go back on it and let me know.

## 2019-12-29 NOTE — Assessment & Plan Note (Signed)
Good control.  Check labs.  Continue current medications.

## 2019-12-29 NOTE — Assessment & Plan Note (Addendum)
Prescription for shingles vaccine.  Set up future order for bone density when she gets her mammogram.  Discussed exercise.

## 2019-12-29 NOTE — Progress Notes (Signed)
    CHIEF COMPLAINT / HPI: Well checkup.  Having a lot of problem with her thumb joints and think she eventually will have to have joint replacement.  Otherwise she is doing pretty well.  Has restarted exercising a little bit.   PERTINENT  PMH / PSH: I have reviewed the patient's medications, allergies, past medical and surgical history, smoking status and updated in the EMR as appropriate.   OBJECTIVE:  BP 130/72   Pulse 77   Wt 254 lb 6.4 oz (115.4 kg)   SpO2 96%   BMI 38.68 kg/m  Vital signs reviewed. GENERAL: Well-developed, well-nourished, no acute distress. CARDIOVASCULAR: Regular rate and rhythm no murmur gallop or rub LUNGS: Clear to auscultation bilaterally, no rales or wheeze. ABDOMEN: Soft positive bowel sounds NEURO: No gross focal neurological deficits. MSK: Movement of extremity x 4.  Bilateral thumb joints tender to palpation mildly.  There is no unusual erythema or warmth.  She still has full range of motion in all planes of the thumb bilaterally.    ASSESSMENT / PLAN:   Essential hypertension Good control.  Check labs.  Continue current medications.  Degenerative arthritis of thumb Continue to follow with hand surgeon.  She may at some point in the near future knee joint replacement.  Well adult exam Prescription for shingles vaccine.  Set up future order for bone density when she gets her mammogram.  Discussed exercise.  Gastroesophageal reflux disease She has been on Protonix and has been asymptomatic.  We will try weaning her off that.  If symptoms occur more than 2-3 times a week, she will go back on it and let me know.   Dorcas Mcmurray MD

## 2019-12-29 NOTE — Assessment & Plan Note (Signed)
Continue to follow with hand surgeon.  She may at some point in the near future knee joint replacement.

## 2020-01-01 ENCOUNTER — Other Ambulatory Visit: Payer: Self-pay | Admitting: Family Medicine

## 2020-01-01 DIAGNOSIS — Z1231 Encounter for screening mammogram for malignant neoplasm of breast: Secondary | ICD-10-CM

## 2020-01-01 DIAGNOSIS — E2839 Other primary ovarian failure: Secondary | ICD-10-CM

## 2020-01-30 DIAGNOSIS — R69 Illness, unspecified: Secondary | ICD-10-CM | POA: Diagnosis not present

## 2020-02-07 DIAGNOSIS — M79642 Pain in left hand: Secondary | ICD-10-CM | POA: Diagnosis not present

## 2020-02-07 DIAGNOSIS — M180 Bilateral primary osteoarthritis of first carpometacarpal joints: Secondary | ICD-10-CM | POA: Diagnosis not present

## 2020-02-07 DIAGNOSIS — M79641 Pain in right hand: Secondary | ICD-10-CM | POA: Diagnosis not present

## 2020-02-19 ENCOUNTER — Ambulatory Visit (INDEPENDENT_AMBULATORY_CARE_PROVIDER_SITE_OTHER): Payer: Medicare HMO

## 2020-02-19 ENCOUNTER — Other Ambulatory Visit: Payer: Self-pay

## 2020-02-19 ENCOUNTER — Other Ambulatory Visit: Payer: Medicare HMO

## 2020-02-19 DIAGNOSIS — Z23 Encounter for immunization: Secondary | ICD-10-CM

## 2020-02-19 DIAGNOSIS — Z7189 Other specified counseling: Secondary | ICD-10-CM

## 2020-02-19 DIAGNOSIS — R7309 Other abnormal glucose: Secondary | ICD-10-CM | POA: Diagnosis not present

## 2020-02-19 DIAGNOSIS — Z0000 Encounter for general adult medical examination without abnormal findings: Secondary | ICD-10-CM

## 2020-02-19 DIAGNOSIS — Z7185 Encounter for immunization safety counseling: Secondary | ICD-10-CM

## 2020-02-19 NOTE — Progress Notes (Signed)
Patient presents in nurse clinic for PNA 23 vaccine. Vaccine given LD, patient tolerated well.

## 2020-02-20 LAB — COMPREHENSIVE METABOLIC PANEL
ALT: 36 IU/L — ABNORMAL HIGH (ref 0–32)
AST: 18 IU/L (ref 0–40)
Albumin/Globulin Ratio: 1.7 (ref 1.2–2.2)
Albumin: 4.2 g/dL (ref 3.8–4.8)
Alkaline Phosphatase: 81 IU/L (ref 39–117)
BUN/Creatinine Ratio: 21 (ref 12–28)
BUN: 18 mg/dL (ref 8–27)
Bilirubin Total: 0.4 mg/dL (ref 0.0–1.2)
CO2: 24 mmol/L (ref 20–29)
Calcium: 9.7 mg/dL (ref 8.7–10.3)
Chloride: 98 mmol/L (ref 96–106)
Creatinine, Ser: 0.86 mg/dL (ref 0.57–1.00)
GFR calc Af Amer: 81 mL/min/{1.73_m2} (ref >59)
GFR calc non Af Amer: 71 mL/min/{1.73_m2} (ref >59)
Globulin, Total: 2.5 g/dL (ref 1.5–4.5)
Glucose: 117 mg/dL — ABNORMAL HIGH (ref 65–99)
Potassium: 4.8 mmol/L (ref 3.5–5.2)
Sodium: 140 mmol/L (ref 134–144)
Total Protein: 6.7 g/dL (ref 6.0–8.5)

## 2020-02-20 LAB — LIPID PANEL
Chol/HDL Ratio: 4.1 ratio (ref 0.0–4.4)
Cholesterol, Total: 176 mg/dL (ref 100–199)
HDL: 43 mg/dL (ref >39)
LDL Chol Calc (NIH): 114 mg/dL — ABNORMAL HIGH (ref 0–99)
Triglycerides: 106 mg/dL (ref 0–149)
VLDL Cholesterol Cal: 19 mg/dL (ref 5–40)

## 2020-02-21 ENCOUNTER — Encounter: Payer: Self-pay | Admitting: Family Medicine

## 2020-02-24 HISTORY — PX: HAND SURGERY: SHX662

## 2020-03-01 DIAGNOSIS — M13132 Monoarthritis, not elsewhere classified, left wrist: Secondary | ICD-10-CM | POA: Diagnosis not present

## 2020-03-01 DIAGNOSIS — M1812 Unilateral primary osteoarthritis of first carpometacarpal joint, left hand: Secondary | ICD-10-CM | POA: Diagnosis not present

## 2020-03-01 NOTE — Progress Notes (Signed)
Labs look great! Cholesterol is much better. YAY you! Best of luck with the thumb surgery stuff.

## 2020-03-14 DIAGNOSIS — Z4789 Encounter for other orthopedic aftercare: Secondary | ICD-10-CM | POA: Diagnosis not present

## 2020-03-14 DIAGNOSIS — M1812 Unilateral primary osteoarthritis of first carpometacarpal joint, left hand: Secondary | ICD-10-CM | POA: Diagnosis not present

## 2020-03-14 DIAGNOSIS — M180 Bilateral primary osteoarthritis of first carpometacarpal joints: Secondary | ICD-10-CM | POA: Diagnosis not present

## 2020-03-14 DIAGNOSIS — M79645 Pain in left finger(s): Secondary | ICD-10-CM | POA: Diagnosis not present

## 2020-03-18 ENCOUNTER — Other Ambulatory Visit: Payer: Self-pay

## 2020-03-18 ENCOUNTER — Ambulatory Visit
Admission: RE | Admit: 2020-03-18 | Discharge: 2020-03-18 | Disposition: A | Discharge status: Home / Self Care | Payer: Medicare HMO | Source: Ambulatory Visit | Attending: Family Medicine | Admitting: Family Medicine

## 2020-03-18 DIAGNOSIS — Z1231 Encounter for screening mammogram for malignant neoplasm of breast: Secondary | ICD-10-CM

## 2020-03-18 DIAGNOSIS — E2839 Other primary ovarian failure: Secondary | ICD-10-CM

## 2020-03-18 DIAGNOSIS — Z780 Asymptomatic menopausal state: Secondary | ICD-10-CM | POA: Diagnosis not present

## 2020-03-19 ENCOUNTER — Encounter: Payer: Self-pay | Admitting: Family Medicine

## 2020-03-19 NOTE — Progress Notes (Signed)
No oesteopenia

## 2020-03-28 ENCOUNTER — Other Ambulatory Visit: Payer: Self-pay | Admitting: Family Medicine

## 2020-03-28 DIAGNOSIS — Z4789 Encounter for other orthopedic aftercare: Secondary | ICD-10-CM | POA: Diagnosis not present

## 2020-03-28 DIAGNOSIS — M79645 Pain in left finger(s): Secondary | ICD-10-CM | POA: Diagnosis not present

## 2020-03-28 DIAGNOSIS — M180 Bilateral primary osteoarthritis of first carpometacarpal joints: Secondary | ICD-10-CM | POA: Diagnosis not present

## 2020-04-11 DIAGNOSIS — M180 Bilateral primary osteoarthritis of first carpometacarpal joints: Secondary | ICD-10-CM | POA: Diagnosis not present

## 2020-04-11 DIAGNOSIS — M79645 Pain in left finger(s): Secondary | ICD-10-CM | POA: Diagnosis not present

## 2020-04-11 DIAGNOSIS — Z4789 Encounter for other orthopedic aftercare: Secondary | ICD-10-CM | POA: Diagnosis not present

## 2020-04-17 DIAGNOSIS — M79645 Pain in left finger(s): Secondary | ICD-10-CM | POA: Diagnosis not present

## 2020-05-01 DIAGNOSIS — M79645 Pain in left finger(s): Secondary | ICD-10-CM | POA: Diagnosis not present

## 2020-05-06 DIAGNOSIS — M1812 Unilateral primary osteoarthritis of first carpometacarpal joint, left hand: Secondary | ICD-10-CM | POA: Diagnosis not present

## 2020-05-06 DIAGNOSIS — Z4789 Encounter for other orthopedic aftercare: Secondary | ICD-10-CM | POA: Diagnosis not present

## 2020-05-06 DIAGNOSIS — M180 Bilateral primary osteoarthritis of first carpometacarpal joints: Secondary | ICD-10-CM | POA: Diagnosis not present

## 2020-05-06 DIAGNOSIS — M79645 Pain in left finger(s): Secondary | ICD-10-CM | POA: Diagnosis not present

## 2020-06-10 ENCOUNTER — Encounter: Payer: Self-pay | Admitting: Family Medicine

## 2020-06-10 DIAGNOSIS — H524 Presbyopia: Secondary | ICD-10-CM | POA: Diagnosis not present

## 2020-06-10 DIAGNOSIS — H35372 Puckering of macula, left eye: Secondary | ICD-10-CM | POA: Diagnosis not present

## 2020-06-10 DIAGNOSIS — H35013 Changes in retinal vascular appearance, bilateral: Secondary | ICD-10-CM | POA: Diagnosis not present

## 2020-06-10 DIAGNOSIS — H35033 Hypertensive retinopathy, bilateral: Secondary | ICD-10-CM | POA: Diagnosis not present

## 2020-06-10 DIAGNOSIS — H35433 Paving stone degeneration of retina, bilateral: Secondary | ICD-10-CM | POA: Diagnosis not present

## 2020-06-10 DIAGNOSIS — H2513 Age-related nuclear cataract, bilateral: Secondary | ICD-10-CM | POA: Diagnosis not present

## 2020-06-27 DIAGNOSIS — R69 Illness, unspecified: Secondary | ICD-10-CM | POA: Diagnosis not present

## 2020-08-21 DIAGNOSIS — R69 Illness, unspecified: Secondary | ICD-10-CM | POA: Diagnosis not present

## 2020-08-22 DIAGNOSIS — Z0100 Encounter for examination of eyes and vision without abnormal findings: Secondary | ICD-10-CM | POA: Diagnosis not present

## 2020-09-06 ENCOUNTER — Encounter: Payer: Self-pay | Admitting: Family Medicine

## 2020-09-11 ENCOUNTER — Ambulatory Visit (INDEPENDENT_AMBULATORY_CARE_PROVIDER_SITE_OTHER): Payer: Medicare HMO | Admitting: Family Medicine

## 2020-09-11 ENCOUNTER — Other Ambulatory Visit: Payer: Self-pay

## 2020-09-11 ENCOUNTER — Encounter: Payer: Self-pay | Admitting: Family Medicine

## 2020-09-11 VITALS — BP 120/62 | HR 72 | Ht 68.0 in | Wt 259.2 lb

## 2020-09-11 DIAGNOSIS — L609 Nail disorder, unspecified: Secondary | ICD-10-CM | POA: Diagnosis not present

## 2020-09-11 MED ORDER — FLUTICASONE PROPIONATE 50 MCG/ACT NA SUSP
2.0000 | Freq: Every day | NASAL | 3 refills | Status: DC
Start: 2020-09-11 — End: 2021-03-26

## 2020-09-12 DIAGNOSIS — L609 Nail disorder, unspecified: Secondary | ICD-10-CM | POA: Insufficient documentation

## 2020-09-12 NOTE — Assessment & Plan Note (Signed)
Nail abnormality that most likely is splinter hemorrhage although she recalls no trauma.  I cannot reassure myself for her that is not something more significant so we will refer her to dermatology.

## 2020-09-12 NOTE — Progress Notes (Signed)
    CHIEF COMPLAINT / HPI: New appearance of the lesion in her right toenail.  She noticed it 3 weeks ago.  It has not changed or gotten any bigger.  She is concerned it may be a melanoma.  She recalls no trauma to her nail.  Lesion is painless.   PERTINENT  PMH / PSH: I have reviewed the patient's medications, allergies, past medical and surgical history, smoking status and updated in the EMR as appropriate.   OBJECTIVE:  BP 120/62   Pulse 72   Ht 5\' 8"  (1.727 m)   Wt 259 lb 3.2 oz (117.6 kg)   SpO2 97%   BMI 39.41 kg/m   GENERAL: Well-developed female no acute distress TOENAIL: Right great toenail has a small 2 mm dark spot within the nail bed. ASSESSMENT / PLAN:   Nail abnormality Nail abnormality that most likely is splinter hemorrhage although she recalls no trauma.  I cannot reassure myself for her that is not something more significant so we will refer her to dermatology.   Dorcas Mcmurray MD

## 2020-09-23 ENCOUNTER — Encounter: Payer: Self-pay | Admitting: Family Medicine

## 2020-12-06 ENCOUNTER — Other Ambulatory Visit: Payer: Self-pay | Admitting: Family Medicine

## 2021-02-11 ENCOUNTER — Other Ambulatory Visit: Payer: Self-pay | Admitting: Family Medicine

## 2021-02-11 DIAGNOSIS — Z1231 Encounter for screening mammogram for malignant neoplasm of breast: Secondary | ICD-10-CM

## 2021-03-15 ENCOUNTER — Encounter (HOSPITAL_COMMUNITY): Payer: Self-pay | Admitting: *Deleted

## 2021-03-15 ENCOUNTER — Other Ambulatory Visit: Payer: Self-pay

## 2021-03-15 ENCOUNTER — Ambulatory Visit (HOSPITAL_COMMUNITY)
Admission: EM | Admit: 2021-03-15 | Discharge: 2021-03-15 | Disposition: A | Discharge status: Home / Self Care | Payer: Medicare HMO | Attending: Student | Admitting: Student

## 2021-03-15 DIAGNOSIS — J301 Allergic rhinitis due to pollen: Secondary | ICD-10-CM | POA: Diagnosis not present

## 2021-03-15 DIAGNOSIS — Z8709 Personal history of other diseases of the respiratory system: Secondary | ICD-10-CM | POA: Diagnosis not present

## 2021-03-15 DIAGNOSIS — J209 Acute bronchitis, unspecified: Secondary | ICD-10-CM | POA: Diagnosis not present

## 2021-03-15 MED ORDER — ALBUTEROL SULFATE HFA 108 (90 BASE) MCG/ACT IN AERS: 1.0000 | INHALATION_SPRAY | Freq: Four times a day (QID) | RESPIRATORY_TRACT | 0 refills | Status: DC | PRN

## 2021-03-15 MED ORDER — PREDNISONE 10 MG (21) PO TBPK: ORAL_TABLET | Freq: Every day | ORAL | 0 refills | Status: DC

## 2021-03-15 MED ORDER — PROMETHAZINE-DM 6.25-15 MG/5ML PO SYRP: 5.0000 mL | ORAL_SOLUTION | Freq: Four times a day (QID) | ORAL | 0 refills | Status: DC | PRN

## 2021-03-15 NOTE — ED Triage Notes (Signed)
Pt reports a persistent cough for over a week and can not sleep well due to cough. Pt has tried OTC with out rlief.

## 2021-03-15 NOTE — Discharge Instructions (Signed)
-  Prednisone taper for cough/bronchitis. I recommend taking this in the morning as it could give you energy.   -Albuterol inhaler as needed for cough -Promethazine DM cough syrup for congestion/cough. This could make you drowsy, so take at night before bed. -Continue allegra -Follow-up with PCP as scheduled on 5/25 -Seek additional medical attention if you develop new symptoms like shortness of breath, chest pain, dizziness, new fevers/chills, new/worsening facial pressure

## 2021-03-15 NOTE — ED Provider Notes (Signed)
[facility]    CSN: [id number] Arrival date & time: 03/15/21  1007      History   Chief Complaint Chief Complaint  Patient presents with  . Nasal Congestion  . Cough    HPI Kendra Thompson is a 70 y.o. female.  Presenting with persistent cough for 1 week.  Medical history GERD, obesity, postmenopausal, allergic rhinitis. Notes history asthma and bronchitis in the past. States the cough is frequent and hacking and keeps her up at night.  States she develops nasal congestion, hacking nonproductive cough, headaches 1 week ago.  The headaches and nasal congestion have improved, but the cough has persisted.  This is actually getting worse, especially at night.  Keeping her up at night.  She does take daily Allegra for her allergic rhinitis. Denies fevers/chills, n/v/d, shortness of breath, chest pain, facial pain, teeth pain, headaches, sore throat, loss of taste/smell, swollen lymph nodes, ear pain, bloody sputum, chest pain, dizziness, ear pain, ear pressure.   HPI  Past Medical History:  Diagnosis Date  . GERD (gastroesophageal reflux disease)   . Hypertension   . Seasonal allergies   . Wears glasses     Patient Active Problem List   Diagnosis Date Noted  . Nail abnormality 09/12/2020  . Postmenopausal 12/29/2019  . Gastroesophageal reflux disease 12/29/2019  . Metatarsalgia, left foot 06/05/2019  . Degenerative arthritis of thumb 10/01/2017  . Chronic pain of both knees 09/11/2016  . Obesity 08/03/2015  . 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 History of colonic polyps 06/20/2012  . History of colon polyps 06/20/2012  . Encounter for routine gynecological examination 05/24/2012  . Well adult exam 08/12/2011  . ASTHMA, INTERMITTENT, MILD 08/01/2010  . HYPERGLYCEMIA, BORDERLINE 06/14/2008  . DE QUERVAIN'S TENOSYNOVITIS, LEFT WRIST 07/18/2007  . Essential hypertension 06/16/2007    Past Surgical History:  Procedure Laterality Date  . CLOSED REDUCTION  FINGER WITH PERCUTANEOUS PINNING Left 07/25/2013   Procedure: CLOSED REDUCTION FINGER WITH PERCUTANEOUS PINNING LEFT SMALL METACARPAL;  Surgeon: Tennis Must, MD;  Location: [facility];  Service: Orthopedics;  Laterality: Left;  . COLONOSCOPY  [id number]  . DIAGNOSTIC LAPAROSCOPY  1992   explor-  . DILATION AND CURETTAGE OF UTERUS      OB History   No obstetric history on file.      Home Medications    Prior to Admission medications   Medication Sig Start Date End Date Taking? Authorizing Provider  albuterol (VENTOLIN HFA) 108 (90 Base) MCG/ACT inhaler Inhale 1-2 puffs into the lungs every 6 (six) hours as needed for wheezing or shortness of breath. 03/15/21  Yes Hazel Sams, PA-C  predniSONE (STERAPRED UNI-PAK 21 TAB) 10 MG (21) TBPK tablet Take by mouth daily. Take 6 tabs by mouth daily  for 2 days, then 5 tabs for 2 days, then 4 tabs for 2 days, then 3 tabs for 2 days, 2 tabs for 2 days, then 1 tab by mouth daily for 2 days 03/15/21  Yes Hazel Sams, PA-C  promethazine-dextromethorphan (PROMETHAZINE-DM) 6.25-15 MG/5ML syrup Take 5 mLs by mouth 4 (four) times daily as needed for cough. 03/15/21  Yes Hazel Sams, PA-C  amLODipine (NORVASC) 10 MG tablet TAKE 1 TABLET BY MOUTH EVERY DAY 12/09/20   [name], MD  Candesartan Cilexetil-HCTZ 32-25 MG TABS TAKE 1 TABLET BY MOUTH EVERY DAY 03/29/20   [name], MD  fluticasone [facility]) 50 MCG/ACT nasal spray Place 2 sprays into both nostrils  daily. 09/11/20   [name], MD  pantoprazole (PROTONIX) 40 MG tablet TAKE 1 TABLET BY MOUTH EVERY DAY 12/09/20   [name], MD    Family History Family History  Problem Relation Age of Onset  . Breast cancer Maternal Grandmother        unsure of age  . Breast cancer Mother        mid 19's  . CAD Father        Diagnosed in early 15s  . Stomach cancer Brother   . Colon cancer Neg Hx     Social History Social History   Tobacco Use  . Smoking status: Never Smoker   . Smokeless tobacco: Never Used  Vaping Use  . Vaping Use: Never used  Substance Use Topics  . Alcohol use: No  . Drug use: No     Allergies   Tape   Review of Systems Review of Systems  Constitutional: Negative for appetite change, chills and fever.  HENT: Positive for congestion. Negative for ear pain, rhinorrhea, sinus pressure, sinus pain and sore throat.   Eyes: Negative for redness and visual disturbance.  Respiratory: Positive for cough. Negative for chest tightness, shortness of breath and wheezing.   Cardiovascular: Negative for chest pain and palpitations.  Gastrointestinal: Negative for abdominal pain, constipation, diarrhea, nausea and vomiting.  Genitourinary: Negative for dysuria, frequency and urgency.  Musculoskeletal: Negative for myalgias.  Neurological: Negative for dizziness, weakness and headaches.  Psychiatric/Behavioral: Negative for confusion.  All other systems reviewed and are negative.    Physical Exam Triage Vital Signs ED Triage Vitals  Enc Vitals Group     BP 03/15/21 1028 (!) 160/62     Pulse Rate 03/15/21 1028 82     Resp --      Temp 03/15/21 1028 99.1 F (37.3 C)     Temp src --      SpO2 03/15/21 1028 96 %     Weight --      Height --      Head Circumference --      Peak Flow --      Pain Score 03/15/21 1025 0     Pain Loc --      Pain Edu? --      Excl. in [place]? --    No data found.  Updated Vital Signs BP (!) 160/62   Pulse 82   Temp 99.1 F (37.3 C)   SpO2 96%   Visual Acuity Right Eye Distance:   Left Eye Distance:   Bilateral Distance:    Right Eye Near:   Left Eye Near:    Bilateral Near:     Physical Exam Vitals reviewed.  Constitutional:      General: She is not in acute distress.    Appearance: Normal appearance. She is not ill-appearing.  HENT:     Head: Normocephalic and atraumatic.     Right Ear: Hearing, tympanic membrane, ear canal and external ear normal. No swelling or tenderness. There is no  impacted cerumen. No mastoid tenderness. Tympanic membrane is not perforated, erythematous, retracted or bulging.     Left Ear: Hearing, tympanic membrane, ear canal and external ear normal. No swelling or tenderness. There is no impacted cerumen. No mastoid tenderness. Tympanic membrane is not perforated, erythematous, retracted or bulging.     Nose:     Right Sinus: No maxillary sinus tenderness or frontal sinus tenderness.     Left Sinus: No maxillary sinus tenderness  or frontal sinus tenderness.     Mouth/Throat:     Mouth: Mucous membranes are moist.     Pharynx: Uvula midline. No oropharyngeal exudate or posterior oropharyngeal erythema.     Tonsils: No tonsillar exudate.  Cardiovascular:     Rate and Rhythm: Normal rate and regular rhythm.     Heart sounds: Normal heart sounds.  Pulmonary:     Breath sounds: Normal breath sounds and air entry. No wheezing, rhonchi or rales.     Comments: Frequent hacking cough Chest:     Chest wall: No tenderness.  Abdominal:     General: Abdomen is flat. Bowel sounds are normal.     Tenderness: There is no abdominal tenderness. There is no guarding or rebound.  Lymphadenopathy:     Cervical: No cervical adenopathy.  Neurological:     General: No focal deficit present.     Mental Status: She is alert and oriented to person, place, and time.  Psychiatric:        Attention and Perception: Attention and perception normal.        Mood and Affect: Mood and affect normal.        Behavior: Behavior normal. Behavior is cooperative.        Thought Content: Thought content normal.        Judgment: Judgment normal.      UC Treatments / Results  Labs (all labs ordered are listed, but only abnormal results are displayed) Labs Reviewed - No data to display  EKG   Radiology No results found.  Procedures Procedures (including critical care time)  Medications Ordered in UC Medications - No data to display  Initial Impression / Assessment and  Plan / UC Course  I have reviewed the triage vital signs and the nursing notes.  Pertinent labs & imaging results that were available during my care of the patient were reviewed by me and considered in my medical decision making (see chart for details).     This patient is a 70 year old female presenting with acute bronchitis following viral URI. Today this pt is afebrile nontachycardic nontachypneic, oxygenating well on room air, no wheezes rhonchi or rales.  This patient does have a history of asthma that this has been controlled without inhalers for years.  She is already taking Allegra for allergic rhinitis component.  Prednisone taper, promethazine, albuterol sent as below.  Follow-up with PCP as scheduled on 5/25. ED return precautions discussed.  Final Clinical Impressions(s) / UC Diagnoses   Final diagnoses:  Acute bronchitis, unspecified organism  Seasonal allergic rhinitis due to pollen  History of asthma     Discharge Instructions     -Prednisone taper for cough/bronchitis. I recommend taking this in the morning as it could give you energy.   -Albuterol inhaler as needed for cough -Promethazine DM cough syrup for congestion/cough. This could make you drowsy, so take at night before bed. -Continue allegra -Follow-up with PCP as scheduled on 5/25 -Seek additional medical attention if you develop new symptoms like shortness of breath, chest pain, dizziness, new fevers/chills, new/worsening facial pressure    ED Prescriptions    Medication Sig Dispense Auth. Provider   predniSONE (STERAPRED UNI-PAK 21 TAB) 10 MG (21) TBPK tablet Take by mouth daily. Take 6 tabs by mouth daily  for 2 days, then 5 tabs for 2 days, then 4 tabs for 2 days, then 3 tabs for 2 days, 2 tabs for 2 days, then 1 tab by mouth daily for 2  days 42 tablet Hazel Sams, PA-C   promethazine-dextromethorphan (PROMETHAZINE-DM) 6.25-15 MG/5ML syrup Take 5 mLs by mouth 4 (four) times daily as needed for  cough. 118 mL Hazel Sams, PA-C   albuterol (VENTOLIN HFA) 108 (90 Base) MCG/ACT inhaler Inhale 1-2 puffs into the lungs every 6 (six) hours as needed for wheezing or shortness of breath. 1 each Hazel Sams, PA-C     PDMP not reviewed this encounter.   Hazel Sams, PA-C 03/15/21 1100

## 2021-03-19 ENCOUNTER — Encounter: Payer: Medicare HMO | Admitting: Family Medicine

## 2021-03-22 ENCOUNTER — Other Ambulatory Visit: Payer: Self-pay | Admitting: Family Medicine

## 2021-03-26 ENCOUNTER — Other Ambulatory Visit: Payer: Self-pay

## 2021-03-26 ENCOUNTER — Ambulatory Visit (INDEPENDENT_AMBULATORY_CARE_PROVIDER_SITE_OTHER): Payer: Medicare HMO | Admitting: Family Medicine

## 2021-03-26 ENCOUNTER — Telehealth: Payer: Self-pay | Admitting: *Deleted

## 2021-03-26 ENCOUNTER — Encounter: Payer: Self-pay | Admitting: Family Medicine

## 2021-03-26 VITALS — BP 132/70 | HR 91 | Ht 68.0 in | Wt 250.0 lb

## 2021-03-26 DIAGNOSIS — I10 Essential (primary) hypertension: Secondary | ICD-10-CM | POA: Diagnosis not present

## 2021-03-26 DIAGNOSIS — Z0000 Encounter for general adult medical examination without abnormal findings: Secondary | ICD-10-CM | POA: Diagnosis not present

## 2021-03-26 DIAGNOSIS — B351 Tinea unguium: Secondary | ICD-10-CM | POA: Diagnosis not present

## 2021-03-26 DIAGNOSIS — G8929 Other chronic pain: Secondary | ICD-10-CM | POA: Diagnosis not present

## 2021-03-26 DIAGNOSIS — K219 Gastro-esophageal reflux disease without esophagitis: Secondary | ICD-10-CM

## 2021-03-26 DIAGNOSIS — R7309 Other abnormal glucose: Secondary | ICD-10-CM | POA: Diagnosis not present

## 2021-03-26 DIAGNOSIS — M25562 Pain in left knee: Secondary | ICD-10-CM

## 2021-03-26 DIAGNOSIS — M25561 Pain in right knee: Secondary | ICD-10-CM

## 2021-03-26 LAB — POCT GLYCOSYLATED HEMOGLOBIN (HGB A1C): Hemoglobin A1C: 6.1 % — AB (ref 4.0–5.6)

## 2021-03-26 MED ORDER — TERBINAFINE HCL 250 MG PO TABS: 250.0000 mg | ORAL_TABLET | Freq: Every day | ORAL | 0 refills | Status: DC

## 2021-03-26 NOTE — Patient Instructions (Signed)
Great to see you!   

## 2021-03-26 NOTE — Telephone Encounter (Signed)
error 

## 2021-03-27 ENCOUNTER — Encounter: Payer: Self-pay | Admitting: Family Medicine

## 2021-03-27 DIAGNOSIS — B351 Tinea unguium: Secondary | ICD-10-CM | POA: Insufficient documentation

## 2021-03-27 LAB — COMPREHENSIVE METABOLIC PANEL
ALT: 43 IU/L — ABNORMAL HIGH (ref 0–32)
AST: 19 IU/L (ref 0–40)
Albumin/Globulin Ratio: 1.8 (ref 1.2–2.2)
Albumin: 4.4 g/dL (ref 3.8–4.8)
Alkaline Phosphatase: 73 IU/L (ref 44–121)
BUN/Creatinine Ratio: 20 (ref 12–28)
BUN: 18 mg/dL (ref 8–27)
Bilirubin Total: 0.8 mg/dL (ref 0.0–1.2)
CO2: 26 mmol/L (ref 20–29)
Calcium: 9.5 mg/dL (ref 8.7–10.3)
Chloride: 98 mmol/L (ref 96–106)
Creatinine, Ser: 0.91 mg/dL (ref 0.57–1.00)
Globulin, Total: 2.4 g/dL (ref 1.5–4.5)
Glucose: 114 mg/dL — ABNORMAL HIGH (ref 65–99)
Potassium: 4.1 mmol/L (ref 3.5–5.2)
Sodium: 139 mmol/L (ref 134–144)
Total Protein: 6.8 g/dL (ref 6.0–8.5)
eGFR: 69 mL/min/{1.73_m2} (ref >59)

## 2021-03-27 LAB — LDL CHOLESTEROL, DIRECT: LDL Direct: 100 mg/dL — ABNORMAL HIGH (ref 0–99)

## 2021-03-27 NOTE — Assessment & Plan Note (Signed)
Well-controlled on PPI and will continue.

## 2021-03-27 NOTE — Assessment & Plan Note (Signed)
Health maintenance up-to-date.  Discussed exercise, diet, vaccination status.  She is up-to-date.  We also discussed DEXA scan.  I would recommend getting another one in 3 to 5 years at the earliest.  She had some moderate bone loss.  Recommend continuing calcium vitamin D supplementation.  Weightbearing exercise.

## 2021-03-27 NOTE — Assessment & Plan Note (Signed)
We will check labs today. 

## 2021-03-27 NOTE — Progress Notes (Signed)
    CHIEF COMPLAINT / HPI: #1.  Here for her annual physical.  Things are generally going well.  She and her husband are moving to a townhouse early in February.  They are buying it, it is in [place], [address].  They are quite excited about this. 2.  Recently had a viral respiratory infection and was given steroid taper by urgent care.  The bronchitis is getting much better although she still has a little bit of a productive cough.  She did notice that the steroids really helped her joint pains.  She has chronic pains in bilateral knees bilateral thumbs and foot.  She typically takes 600 mg ibuprofen once or twice a day and it manages it fairly well.  She did notice on the steroids however that it was much better. 3.  Toenail fungus really is bothering her.  She has tried various over-the-counter medications without any relief and would like to try something else.     PERTINENT  PMH / PSH: I have reviewed the patient's medications, allergies, past medical and surgical history, smoking status and updated in the EMR as appropriate.   OBJECTIVE:  BP 132/70   Pulse 91   Ht 5\' 8"  (1.727 m)   Wt 250 lb (113.4 kg)   SpO2 98%   BMI 38.01 kg/m  Vital signs reviewed. GENERAL: Well-developed, well-nourished, no acute distress. CARDIOVASCULAR: Regular rate and rhythm no murmur gallop or rub LUNGS: Clear to auscultation bilaterally, no rales or wheeze. ABDOMEN: Soft positive bowel sounds NEURO: No gross focal neurological deficits. MSK: Movement of extremity x 4. PSYCH: AxOx4. Good eye contact.. No psychomotor retardation or agitation. Appropriate speech fluency and content. Asks and answers questions appropriately. Mood is congruent. Toenails: Bilateral great toes and several of her smaller toes have thickening and discoloration of the nails consistent with onychomycosis   ASSESSMENT / PLAN:   Onychomycosis of toenail Discussed and she wants to try Lamisil therapy.  We discussed the fact that  even after he required 12 weeks of Lamisil, it takes several more months for the nail to grow out.  She will let me know if she has any issues.  Prescription given.  Chronic pain of both knees We discussed whether or not she wanted to proceed with imaging.  We have not done any x-rays on these.  I do not think she is getting close to need for knee replacement so I do not feel strongly she needs x-rays.  She like to put them off for the time being.  She had 1 set of corticosteroid injections which helped.  She does not think she is at the point where she needs those again but will let me know.  For now she will continue NSAIDs.    HYPERGLYCEMIA, BORDERLINE We will check labs today  Essential hypertension Good control.  Continue current medication.  Refills done.  Labs today.  Gastroesophageal reflux disease without esophagitis Well-controlled on PPI and will continue.  Well adult exam Health maintenance up-to-date.  Discussed exercise, diet, vaccination status.  She is up-to-date.  We also discussed DEXA scan.  I would recommend getting another one in 3 to 5 years at the earliest.  She had some moderate bone loss.  Recommend continuing calcium vitamin D supplementation.  Weightbearing exercise.   Dorcas Mcmurray MD

## 2021-03-27 NOTE — Assessment & Plan Note (Signed)
Discussed and she wants to try Lamisil therapy.  We discussed the fact that even after he required 12 weeks of Lamisil, it takes several more months for the nail to grow out.  She will let me know if she has any issues.  Prescription given.

## 2021-03-27 NOTE — Assessment & Plan Note (Signed)
Good control.  Continue current medication.  Refills done.  Labs today.

## 2021-03-27 NOTE — Assessment & Plan Note (Signed)
>>  ASSESSMENT AND PLAN FOR ARTHRITIS OF BOTH KNEES WRITTEN ON 03/27/2021  2:18 PM BY Ashlen Kiger L, MD  We discussed whether or not she wanted to proceed with imaging.  We have not done any x-rays on these.  I do not think she is getting close to need for knee replacement so I do not feel strongly she needs x-rays.  She like to put them off for the time being.  She had 1 set of corticosteroid injections which helped.  She does not think she is at the point where she needs those again but will let me know.  For now she will continue NSAIDs.

## 2021-03-27 NOTE — Assessment & Plan Note (Signed)
We discussed whether or not she wanted to proceed with imaging.  We have not done any x-rays on these.  I do not think she is getting close to need for knee replacement so I do not feel strongly she needs x-rays.  She like to put them off for the time being.  She had 1 set of corticosteroid injections which helped.  She does not think she is at the point where she needs those again but will let me know.  For now she will continue NSAIDs.

## 2021-04-02 ENCOUNTER — Encounter: Payer: Medicare HMO | Admitting: Family Medicine

## 2021-04-03 ENCOUNTER — Other Ambulatory Visit: Payer: Self-pay

## 2021-04-03 ENCOUNTER — Ambulatory Visit
Admission: RE | Admit: 2021-04-03 | Discharge: 2021-04-03 | Disposition: A | Discharge status: Home / Self Care | Payer: Medicare HMO | Source: Ambulatory Visit | Attending: Family Medicine | Admitting: Family Medicine

## 2021-04-03 DIAGNOSIS — Z1231 Encounter for screening mammogram for malignant neoplasm of breast: Secondary | ICD-10-CM

## 2021-04-05 ENCOUNTER — Telehealth: Payer: Medicare HMO | Admitting: Nurse Practitioner

## 2021-04-05 DIAGNOSIS — R6883 Chills (without fever): Secondary | ICD-10-CM

## 2021-04-05 NOTE — Progress Notes (Signed)
Based on what you shared with me it looks like you have chills,that should be evaluated in a face to face office visit. This is not usually a side effect of lamisil. I would just monitor symptoms for now. Motrin ot tylenol for fever or body aches. Follow up with PCP as needed.   NOTE: If you entered your credit card information for this eVisit, you will not be charged. You may see a "hold" on your card for the $35 but that hold will drop off and you will not have a charge processed.   If you are having a true medical emergency please call 911.      For an urgent face to face visit, [facility] has six urgent care centers for your convenience:     [facility] [place] Get Driving Directions [phone] [address] [place], [place] [address] 8 am - 4 pm Monday - Friday    [facility]) Get Driving Directions [phone] [place], [place] [address] 8 am to 8 pm Monday-Friday 10 am to 6 pm [facility] [place]) Get Driving Directions [phone]  [address] [place],  [place]  [address] 8 am to 8 pm Monday-Friday 8 am to 4 pm [facility] at [facility] Get Driving Directions [phone] [place], [place],  [address] 8 am to 8 pm Monday-Friday 8 am to 4 pm [facility] at [facility] Get Driving Directions  [phone] [address] [place], [place] [address] 8 am to 8 pm Monday-Friday 8 am to 4 pm [facility] at [place] Get Driving Directions [phone] [address] [place], [place] [address] 8 am to 8 pm Monday-Friday 8 am to 4 pm Saturday-Sunday     Your MyChart E-visit questionnaire answers were reviewed by a board certified advanced clinical practitioner to  complete your personal care plan based on your specific symptoms.  Thank you for using e-Visits.

## 2021-04-06 ENCOUNTER — Ambulatory Visit (HOSPITAL_COMMUNITY)
Admission: EM | Admit: 2021-04-06 | Discharge: 2021-04-06 | Disposition: A | Discharge status: Home / Self Care | Payer: Medicare HMO | Attending: Medical Oncology | Admitting: Medical Oncology

## 2021-04-06 ENCOUNTER — Encounter (HOSPITAL_COMMUNITY): Payer: Self-pay | Admitting: Emergency Medicine

## 2021-04-06 ENCOUNTER — Other Ambulatory Visit: Payer: Self-pay

## 2021-04-06 ENCOUNTER — Ambulatory Visit (INDEPENDENT_AMBULATORY_CARE_PROVIDER_SITE_OTHER): Payer: Medicare HMO

## 2021-04-06 ENCOUNTER — Ambulatory Visit (HOSPITAL_COMMUNITY): Payer: Self-pay

## 2021-04-06 DIAGNOSIS — R059 Cough, unspecified: Secondary | ICD-10-CM

## 2021-04-06 DIAGNOSIS — J0140 Acute pansinusitis, unspecified: Secondary | ICD-10-CM

## 2021-04-06 DIAGNOSIS — R509 Fever, unspecified: Secondary | ICD-10-CM | POA: Diagnosis not present

## 2021-04-06 DIAGNOSIS — R0602 Shortness of breath: Secondary | ICD-10-CM

## 2021-04-06 MED ORDER — AMOXICILLIN-POT CLAVULANATE 875-125 MG PO TABS: 1.0000 | ORAL_TABLET | Freq: Two times a day (BID) | ORAL | 0 refills | Status: DC

## 2021-04-06 NOTE — ED Provider Notes (Addendum)
[place]    CSN: [id number] Arrival date & time: 04/06/21  1236      History   Chief Complaint Chief Complaint  Patient presents with   Cough   Fever   Shortness of Breath    HPI Kendra Thompson is a 70 y.o. female.   HPI  Cough: Patient states that she had cough, fever and shortness of breath (from not being able to breath well from her nose) for the past month and a half. Cough has resolved but recently her sinus congestion has worsened.  She now is having low-grade fevers as of yesterday with worsening sinus congestion, sinus headache. She has a history of asthma which is mild intermittent.  She does not have a history of COPD.  She denies any chest pain, shortness of breath from lungs, wheezing, vomiting, visual changes.   Past Medical History:  Diagnosis Date   GERD (gastroesophageal reflux disease)    Hypertension    Seasonal allergies    Wears glasses     Patient Active Problem List   Diagnosis Date Noted   Onychomycosis of toenail 03/27/2021   Postmenopausal 12/29/2019   Gastroesophageal reflux disease without esophagitis 12/29/2019   Degenerative arthritis of thumb 10/01/2017   Chronic pain of both knees 09/11/2016   Obesity 08/03/2015   History of colonic polyps 06/20/2012   History of colon polyps 06/20/2012   Encounter for routine gynecological examination 05/24/2012   Well adult exam 08/12/2011   ASTHMA, INTERMITTENT, MILD 08/01/2010   HYPERGLYCEMIA, BORDERLINE 06/14/2008   DE QUERVAIN'S TENOSYNOVITIS, LEFT WRIST 07/18/2007   Essential hypertension 06/16/2007    Past Surgical History:  Procedure Laterality Date   CLOSED REDUCTION FINGER WITH PERCUTANEOUS PINNING Left 07/25/2013   Procedure: CLOSED REDUCTION FINGER WITH PERCUTANEOUS PINNING LEFT SMALL METACARPAL;  Surgeon: Tennis Must, MD;  Location: [facility];  Service: Orthopedics;  Laterality: Left;   COLONOSCOPY  [id number]   DIAGNOSTIC LAPAROSCOPY  1992    explor-   DILATION AND CURETTAGE OF UTERUS      OB History   No obstetric history on file.      Home Medications    Prior to Admission medications   Medication Sig Start Date End Date Taking? Authorizing Provider  amLODipine (NORVASC) 10 MG tablet TAKE 1 TABLET BY MOUTH EVERY DAY 12/09/20   [name], MD  Candesartan Cilexetil-HCTZ 32-25 MG TABS TAKE 1 TABLET BY MOUTH EVERY DAY 03/25/21   [name], MD  pantoprazole (PROTONIX) 40 MG tablet TAKE 1 TABLET BY MOUTH EVERY DAY 12/09/20   [name], MD  terbinafine (LAMISIL) 250 MG tablet Take 1 tablet (250 mg total) by mouth daily. 03/26/21   [name], MD    Family History Family History  Problem Relation Age of Onset   Breast cancer Maternal Grandmother        unsure of age   Breast cancer Mother        mid 46's   CAD Father        Diagnosed in early 20s   Stomach cancer Brother    Colon cancer Neg Hx     Social History Social History   Tobacco Use   Smoking status: Never   Smokeless tobacco: Never  Vaping Use   Vaping Use: Never used  Substance Use Topics   Alcohol use: No   Drug use: No     Allergies   Tape   Review of Systems Review of  Systems  As stated above in HPI Physical Exam Triage Vital Signs ED Triage Vitals  Enc Vitals Group     BP 04/06/21 1303 117/71     Pulse Rate 04/06/21 1303 (!) 101     Resp 04/06/21 1303 18     Temp 04/06/21 1303 98 F (36.7 C)     Temp Source 04/06/21 1303 Oral     SpO2 04/06/21 1303 97 %     Weight --      Height --      Head Circumference --      Peak Flow --      Pain Score 04/06/21 1259 3     Pain Loc --      Pain Edu? --      Excl. in [place]? --    No data found.  Updated Vital Signs BP 117/71 (BP Location: Right Arm)   Pulse (!) 101   Temp 98 F (36.7 C) (Oral)   Resp 18   SpO2 97%   Physical Exam Vitals and nursing note reviewed.  Constitutional:      General: She is not in acute distress.    Appearance: She is well-developed. She is  not ill-appearing, toxic-appearing or diaphoretic.     Comments: Pt sounds congested  HENT:     Head: Normocephalic and atraumatic.     Right Ear: Hearing and tympanic membrane normal.     Left Ear: Hearing normal. Tympanic membrane is erythematous and bulging.     Nose: Mucosal edema and congestion present.     Right Turbinates: Enlarged and swollen.     Left Turbinates: Enlarged and swollen.     Right Sinus: Maxillary sinus tenderness and frontal sinus tenderness present.     Left Sinus: Maxillary sinus tenderness and frontal sinus tenderness present.     Mouth/Throat:     Mouth: Mucous membranes are moist.     Pharynx: Oropharynx is clear. No pharyngeal swelling or oropharyngeal exudate.  Eyes:     Extraocular Movements: Extraocular movements intact.     Pupils: Pupils are equal, round, and reactive to light.  Cardiovascular:     Rate and Rhythm: Normal rate and regular rhythm.  Pulmonary:     Effort: Pulmonary effort is normal.     Breath sounds: Normal breath sounds.  Musculoskeletal:     Cervical back: Normal range of motion.  Lymphadenopathy:     Cervical: No cervical adenopathy.  Skin:    General: Skin is warm.  Neurological:     Mental Status: She is alert.     UC Treatments / Results  Labs (all labs ordered are listed, but only abnormal results are displayed) Labs Reviewed - No data to display  EKG   Radiology No results found.  Procedures Procedures (including critical care time)  Medications Ordered in UC Medications - No data to display  Initial Impression / Assessment and Plan / UC Course  I have reviewed the triage vital signs and the nursing notes.  Pertinent labs & imaging results that were available during my care of the patient were reviewed by me and considered in my medical decision making (see chart for details).     New.  We double checked a chest x-ray to ensure no sign of pneumonia given her recent fever and history of cough x1 month.   Chest x-ray does not show any frank signs of pneumonia.  At this time we are going to treat for otitis media as well as  acute sinusitis with Augmentin to prevent further systemic illness and complication.  Discussed with patient.  Discussed red flag signs symptoms.  She may benefit from [facility] or a sinus rinse in the meantime. Final Clinical Impressions(s) / UC Diagnoses   Final diagnoses:  None   Discharge Instructions   None    ED Prescriptions   None    PDMP not reviewed this encounter.   Hughie Closs, PA-C 04/06/21 1352    Hughie Closs, PA-C 04/06/21 1352

## 2021-04-06 NOTE — ED Triage Notes (Signed)
Pt presents with cough, headache, fever, and SOB that started yesterday. States was treated on 5/21 for bronchitis. States had fever of 101 last night but reduced with advil.

## 2021-04-07 ENCOUNTER — Other Ambulatory Visit: Payer: Self-pay | Admitting: Family Medicine

## 2021-04-07 DIAGNOSIS — R928 Other abnormal and inconclusive findings on diagnostic imaging of breast: Secondary | ICD-10-CM

## 2021-04-11 ENCOUNTER — Ambulatory Visit
Admission: RE | Admit: 2021-04-11 | Discharge: 2021-04-11 | Disposition: A | Discharge status: Home / Self Care | Payer: Medicare HMO | Source: Ambulatory Visit | Attending: Family Medicine | Admitting: Family Medicine

## 2021-04-11 ENCOUNTER — Other Ambulatory Visit: Payer: Self-pay | Admitting: Family Medicine

## 2021-04-11 ENCOUNTER — Other Ambulatory Visit: Payer: Self-pay

## 2021-04-11 DIAGNOSIS — R922 Inconclusive mammogram: Secondary | ICD-10-CM | POA: Diagnosis not present

## 2021-04-11 DIAGNOSIS — R928 Other abnormal and inconclusive findings on diagnostic imaging of breast: Secondary | ICD-10-CM

## 2021-04-11 DIAGNOSIS — R921 Mammographic calcification found on diagnostic imaging of breast: Secondary | ICD-10-CM

## 2021-04-11 DIAGNOSIS — Z803 Family history of malignant neoplasm of breast: Secondary | ICD-10-CM | POA: Diagnosis not present

## 2021-04-18 ENCOUNTER — Ambulatory Visit
Admission: RE | Admit: 2021-04-18 | Discharge: 2021-04-18 | Disposition: A | Discharge status: Home / Self Care | Payer: Medicare HMO | Source: Ambulatory Visit | Attending: Family Medicine | Admitting: Family Medicine

## 2021-04-18 ENCOUNTER — Other Ambulatory Visit: Payer: Self-pay

## 2021-04-18 ENCOUNTER — Other Ambulatory Visit: Payer: Self-pay | Admitting: Family Medicine

## 2021-04-18 DIAGNOSIS — R921 Mammographic calcification found on diagnostic imaging of breast: Secondary | ICD-10-CM

## 2021-04-18 DIAGNOSIS — D0512 Intraductal carcinoma in situ of left breast: Secondary | ICD-10-CM | POA: Diagnosis not present

## 2021-04-18 DIAGNOSIS — C50919 Malignant neoplasm of unspecified site of unspecified female breast: Secondary | ICD-10-CM

## 2021-04-18 HISTORY — DX: Malignant neoplasm of unspecified site of unspecified female breast: C50.919

## 2021-04-22 ENCOUNTER — Telehealth: Payer: Self-pay | Admitting: Oncology

## 2021-04-22 NOTE — Progress Notes (Signed)
I spoke with her by phone. She was already notified about pathology report and is set up with Oncology next Wednesday. She will call me with any new issues.

## 2021-04-22 NOTE — Telephone Encounter (Signed)
SPOKE TO PATIENT TO confirm morning clinic appointment for 7/6, paperwork will be emailed to patient [email or web address]

## 2021-04-24 ENCOUNTER — Encounter: Payer: Self-pay | Admitting: *Deleted

## 2021-04-24 DIAGNOSIS — D0512 Intraductal carcinoma in situ of left breast: Secondary | ICD-10-CM

## 2021-04-29 NOTE — Progress Notes (Signed)
[facility]  [phone] [phone]     ID: CAROLENE GITTO DOB: Aug 17, 1951  MR#: [id number]  BCW#:[id number]  Patient Care Team: [name], MD as PCP - General Rockwell Germany, RN as Oncology Nurse Navigator Mauro Kaufmann, RN as Oncology Nurse Navigator Stark Klein, MD as Consulting Physician (General Surgery) Kyung Rudd, MD as Consulting Physician (Radiation Oncology) Linell Meldrum, Virgie Dad, MD as Consulting Physician (Oncology) Roseanne Kaufman, MD as Consulting Physician (Orthopedic Surgery) Chauncey Cruel, MD OTHER MD:  CHIEF COMPLAINT: Noninvasive estrogen receptor positive breast cancer  CURRENT TREATMENT: Awaiting definitive surgery   HISTORY OF CURRENT ILLNESS: NATALINA WIETING had routine screening mammography on 04/03/2021 showing a possible abnormality in the left breast. She underwent left diagnostic mammography with tomography at [facility] on 04/11/2021 showing: breast density category B; indeterminate calcifications in lower-outer left breast, with largest area measuring 4.9 cm.  Accordingly on 04/18/2021 she proceeded to biopsy of the left breast area in question. The pathology from this procedure ([id number]) showed: ductal carcinoma in situ, intermediate grade. Prognostic indicators significant for: estrogen receptor, 95% positive and progesterone receptor, 80% positive, both with strong staining intensity.   Cancer Staging Ductal carcinoma in situ (DCIS) of left breast Staging form: Breast, AJCC 8th Edition - Clinical stage from 04/30/2021: Stage 0 (cTis (DCIS), cN0, cM0, ER+, PR+) - Signed by Chauncey Cruel, MD on 04/30/2021 Stage prefix: Initial diagnosis Nuclear grade: G2  The patient's subsequent history is as detailed below.   INTERVAL HISTORY: Gaila was evaluated in the multidisciplinary breast cancer clinic on 04/30/2021 accompanied by her husband Bret Harte. Her case was also presented at the  multidisciplinary breast cancer conference on the same day. At that time a preliminary plan was proposed: Consideration of the COMET trial, MRI, antiestrogens, adjuvant radiation, and genetics   REVIEW OF SYSTEMS: There were no specific symptoms leading to the original mammogram, which was routinely scheduled. On the provided questionnaire, Aldea reports recent bronchitis and sinus infection, which caused fever, chills, loss of sleep, shortness of breath, and productive cough. She is otherwise without complaints. The patient denies unusual headaches, visual changes, nausea, vomiting, stiff neck, dizziness, or gait imbalance. There has been no pleurisy, no chest pain or pressure, and no change in bowel or bladder habits. The patient denies fever, rash, bleeding, unexplained fatigue or unexplained weight loss. A detailed review of systems was otherwise entirely negative.   COVID 19 VACCINATION STATUS: Moderna x3   PAST MEDICAL HISTORY: Past Medical History:  Diagnosis Date   Allergy 1971   Arthritis 2017   Asthma 1971   has not been present for several years   Breast cancer ([place]) 04/18/2021   Cataract 2020   Family history of breast cancer 04/30/2021   GERD (gastroesophageal reflux disease)    Hypertension    Seasonal allergies    Wears glasses     PAST SURGICAL HISTORY: Past Surgical History:  Procedure Laterality Date   CLOSED REDUCTION FINGER WITH PERCUTANEOUS PINNING Left 07/25/2013   Procedure: CLOSED REDUCTION FINGER WITH PERCUTANEOUS PINNING LEFT SMALL METACARPAL;  Surgeon: Tennis Must, MD;  Location: [place];  Service: Orthopedics;  Laterality: Left;   COLONOSCOPY  2013,2014   DIAGNOSTIC LAPAROSCOPY  1992   explor-   DILATION AND CURETTAGE OF UTERUS     FRACTURE SURGERY  2014    FAMILY HISTORY: Family History  Problem Relation Age of Onset   Breast cancer Mother 18   Arthritis  Mother    Cancer Mother    Depression Mother    Heart disease Mother     Hypertension Mother    CAD Father        Diagnosed in early 80s   Arthritis Father    Hearing loss Father    Heart disease Father    Esophageal cancer Brother 84   Other Brother 60       neuroendocrine tumor   Alcohol abuse Brother    Breast cancer Maternal Grandmother        dx after 67   Diabetes Maternal Grandmother    Heart disease Maternal Grandmother    Hypertension Maternal Grandmother    Colon cancer Neg Hx   Her father died at age 73 from heart attack and COPD. Her mother died at age 28 from complications of breast cancer. She was initially diagnosed at age 80, had bilateral cancer, and recurred later in life. Tyteanna has one brother and one sister. In addition to her mother, she reports breast cancer in her maternal grandmother (age unknown) and her sister ("pre-cancer") at age 9. She also reports cancer in her brother-- stomach at age 45 and neuroendocrine at age 29.   GYNECOLOGIC HISTORY:  No LMP recorded. Patient is postmenopausal. Menarche: 70 years old GX P 0 LMP ~age 31 Contraceptive: used only for one year in mid-20's. HRT never used  Hysterectomy? no BSO? no   SOCIAL HISTORY: (updated 04/2021)  Leialoha is currently retired from working in Development worker, international aid with [employer]. Husband Ray "Oletta Lamas" is a retired Art therapist; he was a Publishing rights manager. 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    ADVANCED DIRECTIVES: in place   HEALTH MAINTENANCE: Social History   Tobacco Use   Smoking status: Never   Smokeless tobacco: Never  Vaping Use   Vaping Use: Never used  Substance Use Topics   Alcohol use: Yes    Comment: very rarely   Drug use: Never     Colonoscopy: 09/2017 (Dr. Fuller Plan), recall 2023  PAP: 07/2015, negative  Bone density: 02/2020, -0.7   Allergies  Allergen Reactions   Tape Rash    Current Outpatient Medications  Medication Sig Dispense Refill   amLODipine (NORVASC) 10 MG tablet TAKE 1 TABLET BY MOUTH EVERY DAY 90 tablet 3   Candesartan Cilexetil-HCTZ  32-25 MG TABS TAKE 1 TABLET BY MOUTH EVERY DAY 90 tablet 3   Ibuprofen (ADVIL PO) Take by mouth.     MULTIPLE VITAMIN PO Take by mouth.     pantoprazole (PROTONIX) 40 MG tablet TAKE 1 TABLET BY MOUTH EVERY DAY 90 tablet 3   terbinafine (LAMISIL) 250 MG tablet Take 1 tablet (250 mg total) by mouth daily. 90 tablet 0   No current facility-administered medications for this visit.    OBJECTIVE: White woman who appears stated age  47:   04/30/21 0809  BP: 110/68  Pulse: 90  Resp: 18  Temp: 97.8 F (36.6 C)  SpO2: 98%     Body mass index is 37.77 kg/m.   Wt Readings from Last 3 Encounters:  04/30/21 248 lb 6 oz (112.7 kg)  03/26/21 250 lb (113.4 kg)  09/11/20 259 lb 3.2 oz (117.6 kg)      ECOG FS:1 - Symptomatic but completely ambulatory  Ocular: Sclerae unicteric, pupils round and equal Ear-nose-throat: Wearing a mask Lymphatic: No cervical or supraclavicular adenopathy Lungs no rales or rhonchi Heart regular rate and rhythm Abd soft, obese, nontender, positive bowel sounds MSK no focal spinal  tenderness, no joint edema Neuro: non-focal, well-oriented, appropriate affect Breasts: The right breast is benign.  The left breast is status post recent biopsies.  There is a moderate ecchymosis.  Both axillae are benign   LAB RESULTS:  CMP     Component Value Date/Time   NA 141 04/30/2021 0754   NA 139 03/26/2021 1007   K 4.1 04/30/2021 0754   CL 103 04/30/2021 0754   CO2 26 04/30/2021 0754   GLUCOSE 124 (H) 04/30/2021 0754   BUN 17 04/30/2021 0754   BUN 18 03/26/2021 1007   CREATININE 0.93 04/30/2021 0754   CREATININE 0.76 09/09/2016 0928   CALCIUM 9.9 04/30/2021 0754   PROT 7.6 04/30/2021 0754   PROT 6.8 03/26/2021 1007   ALBUMIN 3.6 04/30/2021 0754   ALBUMIN 4.4 03/26/2021 1007   AST 17 04/30/2021 0754   ALT 20 04/30/2021 0754   ALKPHOS 71 04/30/2021 0754   BILITOT 0.6 04/30/2021 0754   GFRNONAA >60 04/30/2021 0754   GFRNONAA 84 09/09/2016 0928   GFRAA 81  02/19/2020 0915   GFRAA >89 09/09/2016 0928    No results found for: TOTALPROTELP, ALBUMINELP, A1GS, A2GS, BETS, BETA2SER, GAMS, MSPIKE, SPEI  Lab Results  Component Value Date   WBC 9.2 04/30/2021   NEUTROABS 5.6 04/30/2021   HGB 13.4 04/30/2021   HCT 40.1 04/30/2021   MCV 89.5 04/30/2021   PLT 303 04/30/2021    No results found for: LABCA2  No components found for: [id number]  No results for input(s): INR in the last 168 hours.  No results found for: LABCA2  No results found for: WUJ811  No results found for: BJY782  No results found for: NFA213  No results found for: CA2729  No components found for: HGQUANT  No results found for: CEA1 / No results found for: CEA1   No results found for: AFPTUMOR  No results found for: CHROMOGRNA  No results found for: KPAFRELGTCHN, LAMBDASER, KAPLAMBRATIO (kappa/lambda light chains)  No results found for: HGBA, HGBA2QUANT, HGBFQUANT, HGBSQUAN (Hemoglobinopathy evaluation)   No results found for: LDH  No results found for: IRON, TIBC, IRONPCTSAT (Iron and TIBC)  No results found for: FERRITIN  Urinalysis    Component Value Date/Time   COLORURINE yellow 07/30/2010 0816   APPEARANCEUR Clear 07/30/2010 0816   LABSPEC 1.025 02/03/2014 0947   PHURINE 6.0 02/03/2014 [place] 02/03/2014 0947   HGBUR TRACE (A) 02/03/2014 0947   HGBUR trace-intact 07/30/2010 0816   BILIRUBINUR NEGATIVE 02/03/2014 [place] 02/03/2014 0947   PROTEINUR NEGATIVE 02/03/2014 0947   UROBILINOGEN 0.2 02/03/2014 0947   NITRITE NEGATIVE 02/03/2014 0947   LEUKOCYTESUR NEGATIVE 02/03/2014 0947     STUDIES: DG Chest 2 View  Result Date: 04/06/2021 CLINICAL DATA:  Shortness of breath and cough 1 month with fever last night. EXAM: CHEST - 2 VIEW COMPARISON:  None. FINDINGS: Lungs are adequately inflated without focal airspace consolidation or effusion. Cardiomediastinal silhouette is normal. Mild degenerative change  of the spine. IMPRESSION: No active cardiopulmonary disease. Electronically Signed   By: Marin Olp M.D.   On: 04/06/2021 13:48   MM Digital Diagnostic Unilat L  Result Date: 04/11/2021 CLINICAL DATA:  Patient returns after screening study for evaluation of LEFT breast calcifications. Strong family history of breast cancer diagnosed in her mother and multiple of her mother's family members. EXAM: DIGITAL DIAGNOSTIC UNILATERAL LEFT MAMMOGRAM WITH CAD TECHNIQUE: Left digital diagnostic mammography was performed. Mammographic images were processed with CAD. COMPARISON:  Previous exam(s). ACR Breast Density Category b: There are scattered areas of fibroglandular density. FINDINGS: Magnified views are performed of calcifications in the LOWER OUTER QUADRANT of the LEFT breast. Magnified views demonstrate faint pleomorphic calcifications standing 4.9 x 4.3 x 1.3 centimeters. Some calcifications are scattered and pleomorphic. Other calcifications are faint and linear. Recommend biopsy of the anterior and posterior aspect of the calcifications. IMPRESSION: Indeterminate LEFT breast calcifications. RECOMMENDATION: Recommend 2 site stereotactic guided core biopsy of the LEFT breast. I have discussed the findings and recommendations with the patient. If applicable, a reminder letter will be sent to the patient regarding the next appointment. BI-RADS CATEGORY  4: Suspicious. Electronically Signed   By: Nolon Nations M.D.   On: 04/11/2021 09:26  MM 3D SCREEN BREAST BILATERAL  Result Date: 04/04/2021 CLINICAL DATA:  Screening. EXAM: DIGITAL SCREENING BILATERAL MAMMOGRAM WITH TOMOSYNTHESIS AND CAD TECHNIQUE: Bilateral screening digital craniocaudal and mediolateral oblique mammograms were obtained. Bilateral screening digital breast tomosynthesis was performed. The images were evaluated with computer-aided detection. COMPARISON:  Previous exam(s). ACR Breast Density Category b: There are scattered areas of  fibroglandular density. FINDINGS: In the left breast, calcifications warrant further evaluation. In the right breast, no findings suspicious for malignancy. IMPRESSION: Further evaluation is suggested for calcifications in the left breast. RECOMMENDATION: Diagnostic mammogram of the left breast. (Code:[id number]) The patient will be contacted regarding the findings, and additional imaging will be scheduled. BI-RADS CATEGORY  0: Incomplete. Need additional imaging evaluation and/or prior mammograms for comparison. Electronically Signed   By: Kristopher Oppenheim M.D.   On: 04/04/2021 08:42   MM CLIP PLACEMENT LEFT  Result Date: 04/18/2021 CLINICAL DATA:  Status post stereotactic guided core biopsy of 2 sites of calcifications in the LEFT breast LOWER OUTER QUADRANT. EXAM: 3D DIAGNOSTIC LEFT MAMMOGRAM POST STEREOTACTIC BIOPSY COMPARISON:  Previous exam(s). FINDINGS: 3D Mammographic images were obtained following stereotactic guided biopsy of posterior edge of calcifications in the LOWER OUTER QUADRANT of the LEFT breast and placement of an X shaped clip. The biopsy marking clip is 1 centimeter MEDIAL to the biopsy site. Following biopsy of the anterior edge of calcifications in the LOWER OUTER QUADRANT of the LEFT breast, a ribbon shaped clip was placed. The biopsy marking clip is in the expected location following biopsy. The 2 clips are 5.2 centimeters apart. IMPRESSION: X clip is 1 centimeter MEDIAL to the biopsied calcifications. Ribbon clip is at the expected location following biopsy. Final Assessment: Post Procedure Mammograms for Marker Placement Electronically Signed   By: Nolon Nations M.D.   On: 04/18/2021 10:25  MM LT BREAST BX W LOC DEV 1ST LESION IMAGE BX SPEC STEREO GUIDE  Addendum Date: 04/22/2021   ADDENDUM REPORT: 04/21/2021 12:05 ADDENDUM: Pathology revealed INTERMEDIATE GRADE DUCTAL CARCINOMA IN SITU WITH CALCIFICATIONS of the LEFT breast, lower outer quadrant, posterior edge, (x clip). This was  found to be concordant by Dr. Nolon Nations. Pathology revealed INTERMEDIATE GRADE DUCTAL CARCINOMA IN SITU WITH CALCIFICATIONS WITH NECROSIS AND CALCIFICATIONS of the LEFT breast, lower outer quadrant, anterior edge, (ribbon clip). This was found to be concordant by Dr. Nolon Nations. Pathology results were discussed with the patient by telephone. The patient reported doing well after the biopsies with tenderness and bruising at the sites. Post biopsy instructions and care were reviewed and questions were answered. The patient was encouraged to call [facility] for any additional concerns. My direct phone number was provided. The patient was referred to [facility] at [facility] on April 30, 2021. Pathology results reported by Terie Purser, RN on 04/21/2021. Electronically Signed   By: Nolon Nations M.D.   On: 04/21/2021 12:05   Result Date: 04/22/2021 CLINICAL DATA:  Patient presents for stereotactic guided core biopsy of LEFT breast calcifications. EXAM: LEFT BREAST STEREOTACTIC CORE NEEDLE BIOPSY x2 COMPARISON:  Previous exams. FINDINGS: The patient and I discussed the procedure of stereotactic-guided biopsy including benefits and alternatives. We discussed the high likelihood of a successful procedure. We discussed the risks of the procedure including infection, bleeding, tissue injury, clip migration, and inadequate sampling. Informed written consent was given. The usual time out protocol was performed immediately prior to the procedure. Site 1: Lesion quadrant: LOWER OUTER QUADRANT LEFT breast, posterior edge of calcifications, X clip Using sterile technique and 1% lidocaine and 1% lidocaine with epinephrine as local anesthetic, under stereotactic guidance, a 9 gauge vacuum assisted device was used to perform core needle biopsy of calcifications in the posterior LOWER OUTER QUADRANT of the LEFT breast using a  LATERAL to MEDIAL approach. Specimen radiograph was performed showing calcifications in numerous tissue samples. Specimens with calcifications are identified for pathology. At the conclusion of the procedure, an X tissue marker clip was deployed into the biopsy cavity. Site 2: Lesion quadrant: LOWER OUTER QUADRANT LEFT breast, anterior edge of calcifications, ribbon clip Using sterile technique and 1% lidocaine and 1% lidocaine with epinephrine as local anesthetic, under stereotactic guidance, a 9 gauge vacuum assisted device was used to perform core needle biopsy of calcifications in the anterior LOWER OUTER QUADRANT LEFT breast using a LATERAL to MEDIAL approach. Specimen radiograph was performed showing calcifications in numerous tissue samples. Specimens with calcifications are identified for pathology. At the conclusion of the procedure, ribbon shaped tissue marker clip was deployed into the biopsy cavity. Follow-up 2-view mammogram was performed and dictated separately. IMPRESSION: Stereotactic-guided biopsy of 2 sites of LEFT breast calcifications. No apparent complications. Electronically Signed: By: Nolon Nations M.D. On: 04/18/2021 10:10  MM LT BREAST BX W LOC DEV EA AD LESION IMG BX SPEC STEREO GUIDE  Addendum Date: 04/22/2021   ADDENDUM REPORT: 04/21/2021 12:05 ADDENDUM: Pathology revealed INTERMEDIATE GRADE DUCTAL CARCINOMA IN SITU WITH CALCIFICATIONS of the LEFT breast, lower outer quadrant, posterior edge, (x clip). This was found to be concordant by Dr. Nolon Nations. Pathology revealed INTERMEDIATE GRADE DUCTAL CARCINOMA IN SITU WITH CALCIFICATIONS WITH NECROSIS AND CALCIFICATIONS of the LEFT breast, lower outer quadrant, anterior edge, (ribbon clip). This was found to be concordant by Dr. Nolon Nations. Pathology results were discussed with the patient by telephone. The patient reported doing well after the biopsies with tenderness and bruising at the sites. Post biopsy instructions and  care were reviewed and questions were answered. The patient was encouraged to call [facility] for any additional concerns. My direct phone number was provided. The patient was referred to [facility] on April 30, 2021. Pathology results reported by Terie Purser, RN on 04/21/2021. Electronically Signed   By: Nolon Nations M.D.   On: 04/21/2021 12:05   Result Date: 04/22/2021 CLINICAL DATA:  Patient presents for stereotactic guided core biopsy of LEFT breast calcifications. EXAM: LEFT BREAST STEREOTACTIC CORE NEEDLE BIOPSY x2 COMPARISON:  Previous exams. FINDINGS: The patient and I discussed the procedure of stereotactic-guided biopsy including benefits and alternatives. We discussed the high likelihood of a successful procedure. We discussed the  risks of the procedure including infection, bleeding, tissue injury, clip migration, and inadequate sampling. Informed written consent was given. The usual time out protocol was performed immediately prior to the procedure. Site 1: Lesion quadrant: LOWER OUTER QUADRANT LEFT breast, posterior edge of calcifications, X clip Using sterile technique and 1% lidocaine and 1% lidocaine with epinephrine as local anesthetic, under stereotactic guidance, a 9 gauge vacuum assisted device was used to perform core needle biopsy of calcifications in the posterior LOWER OUTER QUADRANT of the LEFT breast using a LATERAL to MEDIAL approach. Specimen radiograph was performed showing calcifications in numerous tissue samples. Specimens with calcifications are identified for pathology. At the conclusion of the procedure, an X tissue marker clip was deployed into the biopsy cavity. Site 2: Lesion quadrant: LOWER OUTER QUADRANT LEFT breast, anterior edge of calcifications, ribbon clip Using sterile technique and 1% lidocaine and 1% lidocaine with epinephrine as local anesthetic, under  stereotactic guidance, a 9 gauge vacuum assisted device was used to perform core needle biopsy of calcifications in the anterior LOWER OUTER QUADRANT LEFT breast using a LATERAL to MEDIAL approach. Specimen radiograph was performed showing calcifications in numerous tissue samples. Specimens with calcifications are identified for pathology. At the conclusion of the procedure, ribbon shaped tissue marker clip was deployed into the biopsy cavity. Follow-up 2-view mammogram was performed and dictated separately. IMPRESSION: Stereotactic-guided biopsy of 2 sites of LEFT breast calcifications. No apparent complications. Electronically Signed: By: Nolon Nations M.D. On: 04/18/2021 10:10    ELIGIBLE FOR AVAILABLE RESEARCH PROTOCOL: Opted against COMET trial  ASSESSMENT: 70 y.o. [place] woman status post left breast biopsy 04/18/2021 for ductal carcinoma in situ, grade 2, estrogen and progesterone receptor positive  (1) genetics testing  (2) definitive surgery pending  (3) adjuvant radiation  (4) antiestrogen  PLAN: I met today with Bristyn to review her new diagnosis. Specifically we discussed the biology of her breast cancer, its diagnosis, staging, treatment  options and prognosis.Jordann understands that in noninvasive ductal carcinoma, also called ductal carcinoma in situ ("DCIS") the breast cancer cells remain trapped in the ducts were they started. They cannot travel to a vital organ. For that reason these cancers in themselves are not life-threatening.  If the whole breast is removed then all the ducts are removed and since the cancer cells are trapped in the ducts, the cure rate with mastectomy for noninvasive breast cancer is approximately 99%. Nevertheless we recommend lumpectomy, because there is no survival advantage to mastectomy and because the cosmetic result is generally superior with breast conservation.  Since the patient is keeping her breast, there will be some risk of  recurrence. The recurrence can only be in the same breast since, again, the cells are trapped in the ducts. There is no connection from one breast to the other. The risk of local recurrence is cut by more than half with radiation, which is standard in this situation.  In estrogen receptor positive cancers like Tomi Bamberger s, anti-estrogens can also be considered. They will further reduce the risk of recurrence by one half. In addition anti-estrogens will lower the risk of a new breast cancer developing in either breast, also by one half. That risk otherwise approaches 1% per year.   Accordingly the overall plan is for surgery, followed by radiation, then a discussion of anti-estrogens.  Tomi Bamberger does have a significant family history and is undergoing genetics testing. In patients who carry a deleterious mutation [for example in a  BRCA gene], the risk of a new  breast cancer developing in the future may be sufficiently great that the patient may choose bilateral mastectomies. However if she wishes to keep her breasts in that situation it is safe to do so. That would require intensified screening, which generally means not only yearly mammography but a yearly breast MRI as well.   The plan then is for genetics testing, definitive surgery, adjuvant radiation and antiestrogens.  Isel has a good understanding of the overall plan. She agrees with it. She knows the goal of treatment in her case is cure. She will call with any problems that may develop before her next visit here.  Total encounter time 55 minutes.Sarajane Jews C. Rigoberto Repass, MD 04/30/2021 6:17 PM Medical Oncology and Hematology [facility] [place], [place] [address] Tel. [phone]    Fax. [phone]   This document serves as a record of services personally performed by Lurline Del, MD. It was created on his behalf by Wilburn Mylar, a trained medical scribe. The creation of this record is based on the  scribe's personal observations and the provider's statements to them.   I, Lurline Del MD, have reviewed the above documentation for accuracy and completeness, and I agree with the above.    *Total Encounter Time as defined by the Centers for Medicare and Medicaid Services includes, in addition to the face-to-face time of a patient visit (documented in the note above) non-face-to-face time: obtaining and reviewing outside history, ordering and reviewing medications, tests or procedures, care coordination (communications with other health care professionals or caregivers) and documentation in the medical record.

## 2021-04-30 ENCOUNTER — Encounter: Payer: Self-pay | Admitting: *Deleted

## 2021-04-30 ENCOUNTER — Encounter: Payer: Self-pay | Admitting: Oncology

## 2021-04-30 ENCOUNTER — Other Ambulatory Visit: Payer: Self-pay | Admitting: General Surgery

## 2021-04-30 ENCOUNTER — Inpatient Hospital Stay (HOSPITAL_BASED_OUTPATIENT_CLINIC_OR_DEPARTMENT_OTHER): Payer: Medicare HMO | Admitting: Genetic Counselor

## 2021-04-30 ENCOUNTER — Ambulatory Visit
Admission: RE | Admit: 2021-04-30 | Discharge: 2021-04-30 | Disposition: A | Discharge status: Home / Self Care | Payer: Medicare HMO | Source: Ambulatory Visit | Attending: Radiation Oncology | Admitting: Radiation Oncology

## 2021-04-30 ENCOUNTER — Encounter: Payer: Self-pay | Admitting: Genetic Counselor

## 2021-04-30 ENCOUNTER — Encounter: Payer: Self-pay | Admitting: Licensed Clinical Social Worker

## 2021-04-30 ENCOUNTER — Inpatient Hospital Stay: Payer: Medicare HMO

## 2021-04-30 ENCOUNTER — Inpatient Hospital Stay: Payer: Medicare HMO | Attending: Oncology | Admitting: Oncology

## 2021-04-30 ENCOUNTER — Other Ambulatory Visit: Payer: Self-pay

## 2021-04-30 ENCOUNTER — Ambulatory Visit: Payer: Medicare HMO | Admitting: Physical Therapy

## 2021-04-30 VITALS — BP 110/68 | HR 90 | Temp 97.8°F | Resp 18 | Ht 68.0 in | Wt 248.4 lb

## 2021-04-30 DIAGNOSIS — Z803 Family history of malignant neoplasm of breast: Secondary | ICD-10-CM | POA: Diagnosis not present

## 2021-04-30 DIAGNOSIS — D0512 Intraductal carcinoma in situ of left breast: Secondary | ICD-10-CM | POA: Diagnosis not present

## 2021-04-30 DIAGNOSIS — Z800 Family history of malignant neoplasm of digestive organs: Secondary | ICD-10-CM | POA: Diagnosis not present

## 2021-04-30 DIAGNOSIS — C50512 Malignant neoplasm of lower-outer quadrant of left female breast: Secondary | ICD-10-CM | POA: Diagnosis not present

## 2021-04-30 DIAGNOSIS — Z170 Estrogen receptor positive status [ER+]: Secondary | ICD-10-CM | POA: Diagnosis not present

## 2021-04-30 DIAGNOSIS — Z780 Asymptomatic menopausal state: Secondary | ICD-10-CM

## 2021-04-30 DIAGNOSIS — Z809 Family history of malignant neoplasm, unspecified: Secondary | ICD-10-CM | POA: Diagnosis not present

## 2021-04-30 DIAGNOSIS — Z808 Family history of malignant neoplasm of other organs or systems: Secondary | ICD-10-CM | POA: Diagnosis not present

## 2021-04-30 HISTORY — DX: Family history of malignant neoplasm of breast: Z80.3

## 2021-04-30 LAB — CBC WITH DIFFERENTIAL (CANCER CENTER ONLY)
Abs Immature Granulocytes: 0.02 10*3/uL (ref 0.00–0.07)
Basophils Absolute: 0.1 10*3/uL (ref 0.0–0.1)
Basophils Relative: 1 %
Eosinophils Absolute: 0.2 10*3/uL (ref 0.0–0.5)
Eosinophils Relative: 2 %
HCT: 40.1 % (ref 36.0–46.0)
Hemoglobin: 13.4 g/dL (ref 12.0–15.0)
Immature Granulocytes: 0 %
Lymphocytes Relative: 27 %
Lymphs Abs: 2.5 10*3/uL (ref 0.7–4.0)
MCH: 29.9 pg (ref 26.0–34.0)
MCHC: 33.4 g/dL (ref 30.0–36.0)
MCV: 89.5 fL (ref 80.0–100.0)
Monocytes Absolute: 0.8 10*3/uL (ref 0.1–1.0)
Monocytes Relative: 8 %
Neutro Abs: 5.6 10*3/uL (ref 1.7–7.7)
Neutrophils Relative %: 62 %
Platelet Count: 303 10*3/uL (ref 150–400)
RBC: 4.48 MIL/uL (ref 3.87–5.11)
RDW: 14.9 % (ref 11.5–15.5)
WBC Count: 9.2 10*3/uL (ref 4.0–10.5)
nRBC: 0 % (ref 0.0–0.2)

## 2021-04-30 LAB — CMP (CANCER CENTER ONLY)
ALT: 20 U/L (ref 0–44)
AST: 17 U/L (ref 15–41)
Albumin: 3.6 g/dL (ref 3.5–5.0)
Alkaline Phosphatase: 71 U/L (ref 38–126)
Anion gap: 12 (ref 5–15)
BUN: 17 mg/dL (ref 8–23)
CO2: 26 mmol/L (ref 22–32)
Calcium: 9.9 mg/dL (ref 8.9–10.3)
Chloride: 103 mmol/L (ref 98–111)
Creatinine: 0.93 mg/dL (ref 0.44–1.00)
GFR, Estimated: >60 mL/min (ref >60)
Glucose, Bld: 124 mg/dL — ABNORMAL HIGH (ref 70–99)
Potassium: 4.1 mmol/L (ref 3.5–5.1)
Sodium: 141 mmol/L (ref 135–145)
Total Bilirubin: 0.6 mg/dL (ref 0.3–1.2)
Total Protein: 7.6 g/dL (ref 6.5–8.1)

## 2021-04-30 LAB — GENETIC SCREENING ORDER

## 2021-04-30 NOTE — Progress Notes (Signed)
[place] Work  Initial Assessment   Kendra Thompson is a 70 y.o. year old female accompanied by husband, Monty (Ray). Clinical Social Work was referred by [facility] for assessment of psychosocial needs.   SDOH (Social Determinants of Health) assessments performed: Yes SDOH Interventions    Flowsheet Row Most Recent Value  SDOH Interventions   Food Insecurity Interventions Intervention Not Indicated  Financial Strain Interventions Intervention Not Indicated  Housing Interventions Intervention Not Indicated  Transportation Interventions Intervention Not Indicated       Distress Screen completed: Yes ONCBCN DISTRESS SCREENING 04/30/2021  Screening Type Initial Screening  Distress experienced in past week (1-10) 1  Emotional problem type Nervousness/Anxiety  Information Concerns Type Lack of info about treatment      Family/Social Information:  Housing Arrangement: patient lives with husband, dog, bird Family members/support persons in your life? Family (husband, nephew, sister) Transportation concerns: no  Employment: Retired. Income source: Paediatric nurse concerns: No Type of concern: None Food access concerns: no Services Currently in place:  n/a  Coping/ Adjustment to diagnosis: Patient understands treatment plan and what happens next? 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 Patient reported stressors:  General nervousness around diagnosis Hopes and priorities: have the cancer removed Patient enjoys  going to the beach, walks with her dog Current coping skills/ strengths: Capable of independent living, Communication skills, Scientist, research (life sciences), Motivation for treatment/growth, and Supportive family/friends    SUMMARY: Current SDOH Barriers:  No significant SDOH concerns noted today  Clinical Social  Work Clinical Goal(s):  No clinical SW goals at this time  Interventions: Discussed common feeling and emotions when being diagnosed with cancer, and the importance of support during treatment Informed patient of the support team roles and support services at [facility] Provided CSW contact information and encouraged patient to call with any questions or concerns   Follow Up Plan: Patient will contact CSW with any support or resource needs Patient verbalizes understanding of plan: Yes    Christeen Douglas , LCSW

## 2021-04-30 NOTE — Progress Notes (Signed)
REFERRING PROVIDER: Chauncey Cruel, MD [address] [place],  [place] [address]  PRIMARY PROVIDER:  [name], MD  PRIMARY REASON FOR VISIT:  Encounter Diagnoses  Name Primary?   Ductal carcinoma in situ (DCIS) of left breast Yes   Family history of breast cancer       HISTORY OF PRESENT ILLNESS:   Kendra Thompson, a 70 y.o. female, was seen for a [place] cancer genetics consultation during the breast multidisciplinary clinic at the request of Dr. Jana Hakim due to a personal and family history of cancer.  Kendra Thompson presents to clinic today to discuss the possibility of a hereditary predisposition to cancer, to discuss genetic testing, and to further clarify her future cancer risks, as well as potential cancer risks for family members.   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 The treatment plan is pending.   CANCER HISTORY:  Oncology History   No history exists.    RISK FACTORS:  Menarche was at age 69.  Nulliparous.  OCP use for approximately 1 years.  Ovaries intact: yes.  Hysterectomy: no.  Menopausal status: postmenopausal.  HRT use: 0 years. Colonoscopy: yes;  most recent in 09/2017 . Mammogram within the last year: yes. Up to date with pelvic exams: most recent PAP in 2016.  Past Medical History:  Diagnosis Date   Allergy 1971   Arthritis 2017   Asthma 1971   has not been present for several years   Breast cancer ([place]) 04/18/2021   Cataract 2020   GERD (gastroesophageal reflux disease)    Hypertension    Seasonal allergies    Wears glasses     Past Surgical History:  Procedure Laterality Date   CLOSED REDUCTION FINGER WITH PERCUTANEOUS PINNING Left 07/25/2013   Procedure: CLOSED REDUCTION FINGER WITH PERCUTANEOUS PINNING LEFT SMALL METACARPAL;  Surgeon: Tennis Must, MD;  Location: [facility];  Service: Orthopedics;  Laterality: Left;   COLONOSCOPY   2013,2014   DIAGNOSTIC LAPAROSCOPY  1992   explor-   DILATION AND CURETTAGE OF UTERUS     FRACTURE SURGERY  2014    Social History   Socioeconomic History   Marital status: Married    Spouse name: Not on file   Number of children: Not on file   Years of education: Not on file   Highest education level: Not on file  Occupational History   Not on file  Tobacco Use   Smoking status: Never   Smokeless tobacco: Never  Vaping Use   Vaping Use: Never used  Substance and Sexual Activity   Alcohol use: Yes    Comment: very rarely   Drug use: Never   Sexual activity: Yes    Birth control/protection: Post-menopausal  Other Topics Concern   Not on file  Social History Narrative   Not on file   Social Determinants of Health   Financial Resource Strain: Not on file  Food Insecurity: Not on file  Transportation Needs: Not on file  Physical Activity: Not on file  Stress: Not on file  Social Connections: Not on file     FAMILY HISTORY:  We obtained a detailed, 4-generation family history.  Significant diagnoses are listed below: Family History  Problem Relation Age of Onset   Breast cancer Mother 18   Esophageal cancer Brother 44   Other Brother 49       neuroendocrine tumor   Breast  cancer Maternal Grandmother        dx after 86     Kendra Thompson is unaware of previous family history of genetic testing for hereditary cancer risks. There is no reported Ashkenazi Jewish ancestry. There is no known consanguinity.  GENETIC COUNSELING ASSESSMENT: Kendra Thompson is a 69 y.o. female with a personal and family history of cancer which is somewhat suggestive of a hereditary cancer syndrome and predisposition to cancer given the presence of related cancers . We, therefore, discussed and recommended the following at today's visit.   DISCUSSION: We discussed that 5 - 10% of cancer is hereditary, with most cases of hereditary breast cancer associated with mutations in BRCA1/2.  There  are other genes that can be associated with hereditary breast cancer syndromes.  Type of cancer risk and level of risk are gene-specific. We discussed that testing is beneficial for several reasons including knowing how to follow individuals after completing their treatment, identifying whether potential treatment options would be beneficial, and understanding if other family members could be at risk for cancer and allowing them to undergo genetic testing.   We reviewed the characteristics, features and inheritance patterns of hereditary cancer syndromes. We also discussed genetic testing, including the appropriate family members to test, the process of testing, insurance coverage and turn-around-time for results. We discussed the implications of a negative, positive and/or variant of uncertain significant result. In order to get genetic test results in a timely manner so that Kendra Thompson can use these genetic test results for surgical decisions, we recommended Kendra Thompson pursue genetic testing for the [place] BRCAPlus STAT Panel.  The BRCAplus panel offered by [employer] and includes sequencing and deletion/duplication analysis for the following 8 genes: ATM, BRCA1, BRCA2, CDH1, CHEK2, PALB2, PTEN, and TP53. Once complete, we recommend Kendra Thompson pursue reflex genetic testing to a more comprehensive gene panel.   The CancerNext-Expanded gene panel offered by [facility] and includes sequencing, rearrangement, and RNA analysis for the following 77 genes: AIP, ALK, APC, ATM, AXIN2, BAP1, BARD1, BLM, BMPR1A, BRCA1, BRCA2, BRIP1, CDC73, CDH1, CDK4, CDKN1B, CDKN2A, CHEK2, CTNNA1, DICER1, FANCC, FH, FLCN, GALNT12, KIF1B, LZTR1, MAX, MEN1, MET, MLH1, MSH2, MSH3, MSH6, MUTYH, NBN, NF1, NF2, NTHL1, PALB2, PHOX2B, PMS2, POT1, PRKAR1A, PTCH1, PTEN, RAD51C, RAD51D, RB1, RECQL, RET, SDHA, SDHAF2, SDHB, SDHC, SDHD, SMAD4, SMARCA4, SMARCB1, SMARCE1, STK11, SUFU, TMEM127, TP53, TSC1, TSC2, VHL and XRCC2  (sequencing and deletion/duplication); EGFR, EGLN1, HOXB13, KIT, MITF, PDGFRA, POLD1, and POLE (sequencing only); EPCAM and GREM1 (deletion/duplication only).   Based on Kendra Thompson's personal and family history of breast cancer, she meets medical criteria for genetic testing. Despite that she meets criteria, she may still have an out of pocket cost. We discussed that if her out of pocket cost for testing is over $100 the laboratory should contact her to discuss self-pay prices, patient pay assistance programs, if applicable, and other billing options.   PLAN: After considering the risks, benefits, and limitations, Kendra Thompson provided informed consent to pursue genetic testing and the blood sample was sent to [facility] for analysis of the [place] +RNAinsight Panel. Results should be available within approximately 1-2 weeks' time, at which point they will be disclosed by telephone to Kendra Thompson, as will any additional recommendations warranted by these results. Kendra Thompson will receive a summary of her genetic counseling visit and a copy of her results once available. This information will also be available in Epic.   Lastly, we encouraged Kendra Thompson to  remain in contact with cancer genetics annually so that we can continuously update the family history and inform her of any changes in cancer genetics and testing that may be of benefit for this family.   Kendra Thompson's questions were answered to her satisfaction today. Our contact information was provided should additional questions or concerns arise. Thank you for the referral and allowing [place] to share in the care of your patient.   Geo Slone M. Joette Catching, [place], [facility] Genetic Counselor [email or web address] (P) [phone]  The patient was seen for a total of 20 minutes in face-to-face genetic counseling.  The patient brought her husband, Jeanell Sparrow, to her appointment. Drs. Magrinat, Lindi Adie and/or Burr Medico were  available to discuss this case as needed.  _______________________________________________________________________ For Office Staff:  Number of people involved in session: 2 Was an Intern/ student involved with case: no

## 2021-05-01 ENCOUNTER — Telehealth: Payer: Self-pay | Admitting: Oncology

## 2021-05-01 NOTE — Telephone Encounter (Signed)
Sch per 7/6 los, pt aware

## 2021-05-02 ENCOUNTER — Encounter: Payer: Self-pay | Admitting: *Deleted

## 2021-05-02 ENCOUNTER — Telehealth: Payer: Self-pay | Admitting: *Deleted

## 2021-05-02 ENCOUNTER — Other Ambulatory Visit: Payer: Self-pay | Admitting: General Surgery

## 2021-05-02 DIAGNOSIS — D0512 Intraductal carcinoma in situ of left breast: Secondary | ICD-10-CM

## 2021-05-02 DIAGNOSIS — C50512 Malignant neoplasm of lower-outer quadrant of left female breast: Secondary | ICD-10-CM

## 2021-05-02 DIAGNOSIS — Z170 Estrogen receptor positive status [ER+]: Secondary | ICD-10-CM

## 2021-05-02 NOTE — Telephone Encounter (Signed)
Spoke to pt concerning [place] from 7.6.22. Denies questions or concerns regarding dx or treatment care plan. Encourage pt to call with needs. Received verbal understanding.

## 2021-05-04 NOTE — Progress Notes (Signed)
Radiation Oncology         [phone] ________________________________  Name: Kendra Thompson MRN: [id number]  Date: 04/30/2021  DOB: 26-Mar-1951  HL:KTGY, Marzetta Merino, MD  Stark Klein, MD     REFERRING PHYSICIAN: Stark Klein, MD   DIAGNOSIS: The encounter diagnosis was Ductal carcinoma in situ (DCIS) of left breast.   HISTORY OF PRESENT ILLNESS::Kendra Thompson is a 70 y.o. female who is seen for an initial consultation visit regarding the patient's diagnosis of left-sided breast cancer.  The patient was found to have a suspicious area of calcifications on screening mammogram.  In maximum dimension, these measured approximately 4.9 cm.  A biopsy was performed which revealed a grade 2 DCIS.  Receptor studies indicated that the tumor is estrogen receptor positive and progesterone receptor positive.  Further work-up including genetics and an MRI scan has been discussed with the patient.  I have been asked to see the patient for consideration of adjuvant radiation treatment.    PREVIOUS RADIATION THERAPY: No   PAST MEDICAL HISTORY:  has a past medical history of Allergy (1971), Arthritis (2017), Asthma (1971), Breast cancer ([place]) (04/18/2021), Cataract (2020), Family history of breast cancer (04/30/2021), GERD (gastroesophageal reflux disease), Hypertension, Seasonal allergies, and Wears glasses.     PAST SURGICAL HISTORY: Past Surgical History:  Procedure Laterality Date   CLOSED REDUCTION FINGER WITH PERCUTANEOUS PINNING Left 07/25/2013   Procedure: CLOSED REDUCTION FINGER WITH PERCUTANEOUS PINNING LEFT SMALL METACARPAL;  Surgeon: Tennis Must, MD;  Location: [place];  Service: Orthopedics;  Laterality: Left;   COLONOSCOPY  2013,2014   DIAGNOSTIC LAPAROSCOPY  1992   explor-   DILATION AND CURETTAGE OF UTERUS     FRACTURE SURGERY  2014     FAMILY HISTORY: family history includes Alcohol abuse in her brother; Arthritis in her father and mother; Breast cancer in  her maternal grandmother; Breast cancer (age of onset: 66) in her mother; CAD in her father; Cancer in her mother; Depression in her mother; Diabetes in her maternal grandmother; Esophageal cancer (age of onset: 81) in her brother; Hearing loss in her father; Heart disease in her father, maternal grandmother, and mother; Hypertension in her maternal grandmother and mother; Other (age of onset: 57) in her brother.   SOCIAL HISTORY:  reports that she has never smoked. She has never used smokeless tobacco. She reports current alcohol use. She reports that she does not use drugs.   ALLERGIES: Tape   MEDICATIONS:  Current Outpatient Medications  Medication Sig Dispense Refill   amLODipine (NORVASC) 10 MG tablet TAKE 1 TABLET BY MOUTH EVERY DAY 90 tablet 3   Candesartan Cilexetil-HCTZ 32-25 MG TABS TAKE 1 TABLET BY MOUTH EVERY DAY 90 tablet 3   Ibuprofen (ADVIL PO) Take by mouth.     MULTIPLE VITAMIN PO Take by mouth.     pantoprazole (PROTONIX) 40 MG tablet TAKE 1 TABLET BY MOUTH EVERY DAY 90 tablet 3   terbinafine (LAMISIL) 250 MG tablet Take 1 tablet (250 mg total) by mouth daily. 90 tablet 0   No current facility-administered medications for this encounter.     REVIEW OF SYSTEMS:  A 15 point review of systems is documented in the electronic medical record. This was obtained by the nursing staff. However, I reviewed this with the patient to discuss relevant findings and make appropriate changes.  Pertinent items are noted in HPI.    PHYSICAL EXAM:  vitals were not taken for this visit.   ECOG =  0  0 - Asymptomatic (Fully active, able to carry on all predisease activities without restriction)  1 - Symptomatic but completely ambulatory (Restricted in physically strenuous activity but ambulatory and able to carry out work of a light or sedentary nature. For example, light housework, office work)  2 - Symptomatic, <50% in bed during the day (Ambulatory and capable of all self care but  unable to carry out any work activities. Up and about more than 50% of waking hours)  3 - Symptomatic, >50% in bed, but not bedbound (Capable of only limited self-care, confined to bed or chair 50% or more of waking hours)  4 - Bedbound (Completely disabled. Cannot carry on any self-care. Totally confined to bed or chair)  5 - Death   Eustace Pen MM, Creech RH, Tormey DC, et al. [phone]). "Toxicity and response criteria of the [facility]". [place] Oncol. 5 (6): 649-55  Alert, no distress   LABORATORY DATA:  Lab Results  Component Value Date   WBC 9.2 04/30/2021   HGB 13.4 04/30/2021   HCT 40.1 04/30/2021   MCV 89.5 04/30/2021   PLT 303 04/30/2021   Lab Results  Component Value Date   NA 141 04/30/2021   K 4.1 04/30/2021   CL 103 04/30/2021   CO2 26 04/30/2021   Lab Results  Component Value Date   ALT 20 04/30/2021   AST 17 04/30/2021   ALKPHOS 71 04/30/2021   BILITOT 0.6 04/30/2021      RADIOGRAPHY: DG Chest 2 View  Result Date: 04/06/2021 CLINICAL DATA:  Shortness of breath and cough 1 month with fever last night. EXAM: CHEST - 2 VIEW COMPARISON:  None. FINDINGS: Lungs are adequately inflated without focal airspace consolidation or effusion. Cardiomediastinal silhouette is normal. Mild degenerative change of the spine. IMPRESSION: No active cardiopulmonary disease. Electronically Signed   By: Marin Olp M.D.   On: 04/06/2021 13:48   MM Digital Diagnostic Unilat L  Result Date: 04/11/2021 CLINICAL DATA:  Patient returns after screening study for evaluation of LEFT breast calcifications. Strong family history of breast cancer diagnosed in her mother and multiple of her mother's family members. EXAM: DIGITAL DIAGNOSTIC UNILATERAL LEFT MAMMOGRAM WITH CAD TECHNIQUE: Left digital diagnostic mammography was performed. Mammographic images were processed with CAD. COMPARISON:  Previous exam(s). ACR Breast Density Category b: There are scattered areas of  fibroglandular density. FINDINGS: Magnified views are performed of calcifications in the LOWER OUTER QUADRANT of the LEFT breast. Magnified views demonstrate faint pleomorphic calcifications standing 4.9 x 4.3 x 1.3 centimeters. Some calcifications are scattered and pleomorphic. Other calcifications are faint and linear. Recommend biopsy of the anterior and posterior aspect of the calcifications. IMPRESSION: Indeterminate LEFT breast calcifications. RECOMMENDATION: Recommend 2 site stereotactic guided core biopsy of the LEFT breast. I have discussed the findings and recommendations with the patient. If applicable, a reminder letter will be sent to the patient regarding the next appointment. BI-RADS CATEGORY  4: Suspicious. Electronically Signed   By: Nolon Nations M.D.   On: 04/11/2021 09:26  MM CLIP PLACEMENT LEFT  Result Date: 04/18/2021 CLINICAL DATA:  Status post stereotactic guided core biopsy of 2 sites of calcifications in the LEFT breast LOWER OUTER QUADRANT. EXAM: 3D DIAGNOSTIC LEFT MAMMOGRAM POST STEREOTACTIC BIOPSY COMPARISON:  Previous exam(s). FINDINGS: 3D Mammographic images were obtained following stereotactic guided biopsy of posterior edge of calcifications in the LOWER OUTER QUADRANT of the LEFT breast and placement of an X shaped clip. The biopsy marking clip  is 1 centimeter MEDIAL to the biopsy site. Following biopsy of the anterior edge of calcifications in the LOWER OUTER QUADRANT of the LEFT breast, a ribbon shaped clip was placed. The biopsy marking clip is in the expected location following biopsy. The 2 clips are 5.2 centimeters apart. IMPRESSION: X clip is 1 centimeter MEDIAL to the biopsied calcifications. Ribbon clip is at the expected location following biopsy. Final Assessment: Post Procedure Mammograms for Marker Placement Electronically Signed   By: Nolon Nations M.D.   On: 04/18/2021 10:25  MM LT BREAST BX W LOC DEV 1ST LESION IMAGE BX SPEC STEREO GUIDE  Addendum Date:  04/22/2021   ADDENDUM REPORT: 04/21/2021 12:05 ADDENDUM: Pathology revealed INTERMEDIATE GRADE DUCTAL CARCINOMA IN SITU WITH CALCIFICATIONS of the LEFT breast, lower outer quadrant, posterior edge, (x clip). This was found to be concordant by Dr. Nolon Nations. Pathology revealed INTERMEDIATE GRADE DUCTAL CARCINOMA IN SITU WITH CALCIFICATIONS WITH NECROSIS AND CALCIFICATIONS of the LEFT breast, lower outer quadrant, anterior edge, (ribbon clip). This was found to be concordant by Dr. Nolon Nations. Pathology results were discussed with the patient by telephone. The patient reported doing well after the biopsies with tenderness and bruising at the sites. Post biopsy instructions and care were reviewed and questions were answered. The patient was encouraged to call [facility] for any additional concerns. My direct phone number was provided. The patient was referred to [facility] on April 30, 2021. Pathology results reported by Terie Purser, RN on 04/21/2021. Electronically Signed   By: Nolon Nations M.D.   On: 04/21/2021 12:05   Result Date: 04/22/2021 CLINICAL DATA:  Patient presents for stereotactic guided core biopsy of LEFT breast calcifications. EXAM: LEFT BREAST STEREOTACTIC CORE NEEDLE BIOPSY x2 COMPARISON:  Previous exams. FINDINGS: The patient and I discussed the procedure of stereotactic-guided biopsy including benefits and alternatives. We discussed the high likelihood of a successful procedure. We discussed the risks of the procedure including infection, bleeding, tissue injury, clip migration, and inadequate sampling. Informed written consent was given. The usual time out protocol was performed immediately prior to the procedure. Site 1: Lesion quadrant: LOWER OUTER QUADRANT LEFT breast, posterior edge of calcifications, X clip Using sterile technique and 1% lidocaine and 1% lidocaine with  epinephrine as local anesthetic, under stereotactic guidance, a 9 gauge vacuum assisted device was used to perform core needle biopsy of calcifications in the posterior LOWER OUTER QUADRANT of the LEFT breast using a LATERAL to MEDIAL approach. Specimen radiograph was performed showing calcifications in numerous tissue samples. Specimens with calcifications are identified for pathology. At the conclusion of the procedure, an X tissue marker clip was deployed into the biopsy cavity. Site 2: Lesion quadrant: LOWER OUTER QUADRANT LEFT breast, anterior edge of calcifications, ribbon clip Using sterile technique and 1% lidocaine and 1% lidocaine with epinephrine as local anesthetic, under stereotactic guidance, a 9 gauge vacuum assisted device was used to perform core needle biopsy of calcifications in the anterior LOWER OUTER QUADRANT LEFT breast using a LATERAL to MEDIAL approach. Specimen radiograph was performed showing calcifications in numerous tissue samples. Specimens with calcifications are identified for pathology. At the conclusion of the procedure, ribbon shaped tissue marker clip was deployed into the biopsy cavity. Follow-up 2-view mammogram was performed and dictated separately. IMPRESSION: Stereotactic-guided biopsy of 2 sites of LEFT breast calcifications. No apparent complications. Electronically Signed: By: Nolon Nations M.D. On: 04/18/2021 10:10  MM  LT BREAST BX W LOC DEV EA AD LESION IMG BX SPEC STEREO GUIDE  Addendum Date: 04/22/2021   ADDENDUM REPORT: 04/21/2021 12:05 ADDENDUM: Pathology revealed INTERMEDIATE GRADE DUCTAL CARCINOMA IN SITU WITH CALCIFICATIONS of the LEFT breast, lower outer quadrant, posterior edge, (x clip). This was found to be concordant by Dr. Nolon Nations. Pathology revealed INTERMEDIATE GRADE DUCTAL CARCINOMA IN SITU WITH CALCIFICATIONS WITH NECROSIS AND CALCIFICATIONS of the LEFT breast, lower outer quadrant, anterior edge, (ribbon clip). This was found to be  concordant by Dr. Nolon Nations. Pathology results were discussed with the patient by telephone. The patient reported doing well after the biopsies with tenderness and bruising at the sites. Post biopsy instructions and care were reviewed and questions were answered. The patient was encouraged to call [facility] for any additional concerns. My direct phone number was provided. The patient was referred to [facility] on April 30, 2021. Pathology results reported by Terie Purser, RN on 04/21/2021. Electronically Signed   By: Nolon Nations M.D.   On: 04/21/2021 12:05   Result Date: 04/22/2021 CLINICAL DATA:  Patient presents for stereotactic guided core biopsy of LEFT breast calcifications. EXAM: LEFT BREAST STEREOTACTIC CORE NEEDLE BIOPSY x2 COMPARISON:  Previous exams. FINDINGS: The patient and I discussed the procedure of stereotactic-guided biopsy including benefits and alternatives. We discussed the high likelihood of a successful procedure. We discussed the risks of the procedure including infection, bleeding, tissue injury, clip migration, and inadequate sampling. Informed written consent was given. The usual time out protocol was performed immediately prior to the procedure. Site 1: Lesion quadrant: LOWER OUTER QUADRANT LEFT breast, posterior edge of calcifications, X clip Using sterile technique and 1% lidocaine and 1% lidocaine with epinephrine as local anesthetic, under stereotactic guidance, a 9 gauge vacuum assisted device was used to perform core needle biopsy of calcifications in the posterior LOWER OUTER QUADRANT of the LEFT breast using a LATERAL to MEDIAL approach. Specimen radiograph was performed showing calcifications in numerous tissue samples. Specimens with calcifications are identified for pathology. At the conclusion of the procedure, an X tissue marker clip was deployed into the biopsy  cavity. Site 2: Lesion quadrant: LOWER OUTER QUADRANT LEFT breast, anterior edge of calcifications, ribbon clip Using sterile technique and 1% lidocaine and 1% lidocaine with epinephrine as local anesthetic, under stereotactic guidance, a 9 gauge vacuum assisted device was used to perform core needle biopsy of calcifications in the anterior LOWER OUTER QUADRANT LEFT breast using a LATERAL to MEDIAL approach. Specimen radiograph was performed showing calcifications in numerous tissue samples. Specimens with calcifications are identified for pathology. At the conclusion of the procedure, ribbon shaped tissue marker clip was deployed into the biopsy cavity. Follow-up 2-view mammogram was performed and dictated separately. IMPRESSION: Stereotactic-guided biopsy of 2 sites of LEFT breast calcifications. No apparent complications. Electronically Signed: By: Nolon Nations M.D. On: 04/18/2021 10:10      IMPRESSION/ PLAN:  The patient was seen today in multidisciplinary breast clinic.  She has a new diagnosis of left-sided DCIS.  She is undergoing further work-up to determine what the best operative approach would be, lumpectomy versus a mastectomy.  I discussed with the patient that she would not likely require adjuvant radiation treatment if she underwent a mastectomy.  However, I would anticipate a 4-week course of treatment in the setting of lumpectomy.  We discussed the possible benefit as well as side effects and risks.  All of her questions were answered and she indicated that she did wish to proceed with such a treatment at the appropriate time depending on her final decisions and outcomes.  I look forward to seeing her postoperatively as necessary to further coordinate her care.  The patient was seen in person today in clinic.  The total time spent on the patient's visit today was 45 minutes, including chart review, direct discussion/evaluation with the patient, and coordination of care.         ________________________________   Jodelle Gross, MD, PhD   **Disclaimer: This note was dictated with voice recognition software. Similar sounding words can inadvertently be transcribed and this note may contain transcription errors which may not have been corrected upon publication of note.**

## 2021-05-05 ENCOUNTER — Encounter: Payer: Self-pay | Admitting: *Deleted

## 2021-05-05 ENCOUNTER — Ambulatory Visit
Admission: RE | Admit: 2021-05-05 | Discharge: 2021-05-05 | Disposition: A | Discharge status: Home / Self Care | Payer: Medicare HMO | Source: Ambulatory Visit | Attending: Oncology | Admitting: Oncology

## 2021-05-05 ENCOUNTER — Other Ambulatory Visit: Payer: Self-pay

## 2021-05-05 DIAGNOSIS — D0512 Intraductal carcinoma in situ of left breast: Secondary | ICD-10-CM | POA: Diagnosis not present

## 2021-05-05 MED ORDER — GADOBUTROL 1 MMOL/ML IV SOLN
10.0000 mL | Freq: Once | INTRAVENOUS | Status: AC | PRN
  Administered 2021-05-05: 10 mL via INTRAVENOUS

## 2021-05-06 NOTE — Pre-Procedure Instructions (Addendum)
Surgical Instructions    Your procedure is scheduled on Tuesday July 26th.  Report to [facility] Main Entrance "A" at 08:30 A.M., then check in with the Admitting office.  Call this number if you have problems the morning of surgery:  [phone]   If you have any questions prior to your surgery date call [phone]: Open Monday-Friday 8am-4pm    Remember:  Do not eat after midnight the night before your surgery  You may drink clear liquids until 07:30 A.M. the morning of your surgery.   Clear liquids allowed are: Water, Non-Citrus Juices (without pulp), Carbonated Beverages, Clear Tea, Black Coffee Only, and Gatorade    Take these medicines the morning of surgery with A SIP OF WATER   amLODipine (NORVASC)  pantoprazole (PROTONIX)  Olopatadine HCl (PATADAY)- If needed     As of today, STOP taking any Aspirin (unless otherwise instructed by your surgeon) Aleve, Naproxen, Ibuprofen, Motrin, Advil, Goody's, BC's, all herbal medications, fish oil, and all vitamins.                     Do NOT Smoke (Tobacco/Vaping) or drink Alcohol 24 hours prior to your procedure.  If you use a CPAP at night, you may bring all equipment for your overnight stay.   Contacts, glasses, piercing's, hearing aid's, dentures or partials may not be worn into surgery, please bring cases for these belongings.    For patients admitted to the hospital, discharge time will be determined by your treatment team.   Patients discharged the day of surgery will not be allowed to drive home, and someone needs to stay with them for 24 hours.  ONLY 1 SUPPORT PERSON MAY BE PRESENT WHILE YOU ARE IN SURGERY. IF YOU ARE TO BE ADMITTED ONCE YOU ARE IN YOUR ROOM YOU WILL BE ALLOWED TWO (2) VISITORS.  Minor children may have two parents present. Special consideration for safety and communication needs will be reviewed on a case by case basis.   Special instructions:   Kendra Thompson- Preparing For Surgery  Before surgery,  you can play an important role. Because skin is not sterile, your skin needs to be as free of germs as possible. You can reduce the number of germs on your skin by washing with CHG (chlorahexidine gluconate) Soap before surgery.  CHG is an antiseptic cleaner which kills germs and bonds with the skin to continue killing germs even after washing.    Oral Hygiene is also important to reduce your risk of infection.  Remember - BRUSH YOUR TEETH THE MORNING OF SURGERY WITH YOUR REGULAR TOOTHPASTE  Please do not use if you have an allergy to CHG or antibacterial soaps. If your skin becomes reddened/irritated stop using the CHG.  Do not shave (including legs and underarms) for at least 48 hours prior to first CHG shower. It is OK to shave your face.  Please follow these instructions carefully.   Shower the NIGHT BEFORE SURGERY and the MORNING OF SURGERY  If you chose to wash your hair, wash your hair first as usual with your normal shampoo.  After you shampoo, rinse your hair and body thoroughly to remove the shampoo.  Use CHG Soap as you would any other liquid soap. You can apply CHG directly to the skin and wash gently with a scrungie or a clean washcloth.   Apply the CHG Soap to your body ONLY FROM THE NECK DOWN.  Do not use on open wounds or open sores. Avoid  contact with your eyes, ears, mouth and genitals (private parts). Wash Face and genitals (private parts)  with your normal soap.   Wash thoroughly, paying special attention to the area where your surgery will be performed.  Thoroughly rinse your body with warm water from the neck down.  DO NOT shower/wash with your normal soap after using and rinsing off the CHG Soap.  Pat yourself dry with a CLEAN TOWEL.  Wear CLEAN PAJAMAS to bed the night before surgery  Place CLEAN SHEETS on your bed the night before your surgery  DO NOT SLEEP WITH PETS.   Day of Surgery: Shower with CHG soap. Do not wear jewelry, make up, nail polish, gel  polish, artificial nails, or any other type of covering on natural nails including finger and toenails. If patients have artificial nails, gel coating, etc. that need to be removed by a nail salon please have this removed prior to surgery. Surgery may need to be canceled/delayed if the surgeon/ anesthesia feels like the patient is unable to be adequately monitored. Do not wear lotions, powders, perfumes/colognes, or deodorant. Do not shave 48 hours prior to surgery.  Men may shave face and neck. Do not bring valuables to the hospital. [facility] is not responsible for any belongings or valuables. Wear Clean/Comfortable clothing the morning of surgery Remember to brush your teeth WITH YOUR REGULAR TOOTHPASTE.   Please read over the following fact sheets that you were given.

## 2021-05-07 ENCOUNTER — Other Ambulatory Visit: Payer: Self-pay | Admitting: General Surgery

## 2021-05-07 ENCOUNTER — Other Ambulatory Visit: Payer: Self-pay

## 2021-05-07 ENCOUNTER — Encounter (HOSPITAL_COMMUNITY): Payer: Self-pay

## 2021-05-07 ENCOUNTER — Other Ambulatory Visit: Payer: Self-pay | Admitting: *Deleted

## 2021-05-07 ENCOUNTER — Encounter (HOSPITAL_COMMUNITY)
Admission: RE | Admit: 2021-05-07 | Discharge: 2021-05-07 | Disposition: A | Discharge status: Home / Self Care | Payer: Medicare HMO | Source: Ambulatory Visit | Attending: General Surgery | Admitting: General Surgery

## 2021-05-07 DIAGNOSIS — Z01810 Encounter for preprocedural cardiovascular examination: Secondary | ICD-10-CM | POA: Diagnosis not present

## 2021-05-07 DIAGNOSIS — D0512 Intraductal carcinoma in situ of left breast: Secondary | ICD-10-CM

## 2021-05-07 HISTORY — DX: Headache, unspecified: R51.9

## 2021-05-07 NOTE — Progress Notes (Signed)
PCP - Dr. Dorcas Mcmurray Cardiologist - denies  PPM/ICD - n/a Device Orders - n/a Rep Notified - n/a  Chest x-ray - n/a EKG - 05/07/21 Stress Test - denies ECHO - denies Cardiac Cath - denies  Sleep Study - denies CPAP - n/a  Fasting Blood Sugar - n/a  Blood Thinner Instructions: n/a Aspirin Instructions: n/a  ERAS Protcol - Clear liquids until 07:30 A.M. the morning of surgery PRE-SURGERY Ensure or G2- n/a  COVID TEST- n/a. Ambulatory Surgery   Anesthesia review: No  Patient denies shortness of breath, fever, cough and chest pain at PAT appointment   All instructions explained to the patient, with a verbal understanding of the material. Patient agrees to go over the instructions while at home for a better understanding. Patient also instructed to self quarantine after being tested for COVID-19. The opportunity to ask questions was provided.

## 2021-05-08 ENCOUNTER — Telehealth: Payer: Self-pay | Admitting: Genetic Counselor

## 2021-05-08 ENCOUNTER — Encounter: Payer: Self-pay | Admitting: Genetic Counselor

## 2021-05-08 ENCOUNTER — Encounter: Payer: Self-pay | Admitting: *Deleted

## 2021-05-08 DIAGNOSIS — Z1379 Encounter for other screening for genetic and chromosomal anomalies: Secondary | ICD-10-CM | POA: Insufficient documentation

## 2021-05-08 NOTE — Telephone Encounter (Signed)
Revealed negative genetic testing of breast STAT panel.  Discussed that we do not know why she has breast cancer or why there is cancer in the family. It could be familial, due to a different gene that we are not testing, or maybe our current technology may not be able to pick something up.  It will be important for her to keep in contact with genetics to keep up with whether additional testing may be needed.  Results of pan-cancer panel are pending.

## 2021-05-12 ENCOUNTER — Other Ambulatory Visit (HOSPITAL_COMMUNITY): Payer: Self-pay | Admitting: Diagnostic Radiology

## 2021-05-12 ENCOUNTER — Other Ambulatory Visit: Payer: Self-pay

## 2021-05-12 ENCOUNTER — Ambulatory Visit
Admission: RE | Admit: 2021-05-12 | Discharge: 2021-05-12 | Disposition: A | Discharge status: Home / Self Care | Payer: Medicare HMO | Source: Ambulatory Visit | Attending: General Surgery | Admitting: General Surgery

## 2021-05-12 DIAGNOSIS — D0512 Intraductal carcinoma in situ of left breast: Secondary | ICD-10-CM

## 2021-05-12 DIAGNOSIS — R928 Other abnormal and inconclusive findings on diagnostic imaging of breast: Secondary | ICD-10-CM | POA: Diagnosis not present

## 2021-05-12 DIAGNOSIS — N6092 Unspecified benign mammary dysplasia of left breast: Secondary | ICD-10-CM | POA: Diagnosis not present

## 2021-05-12 MED ORDER — GADOBUTROL 1 MMOL/ML IV SOLN
10.0000 mL | Freq: Once | INTRAVENOUS | Status: AC | PRN
  Administered 2021-05-12: 10 mL via INTRAVENOUS

## 2021-05-13 ENCOUNTER — Encounter: Payer: Self-pay | Admitting: *Deleted

## 2021-05-14 ENCOUNTER — Encounter: Payer: Self-pay | Admitting: *Deleted

## 2021-05-16 ENCOUNTER — Ambulatory Visit
Admission: RE | Admit: 2021-05-16 | Discharge: 2021-05-16 | Disposition: A | Discharge status: Home / Self Care | Payer: Medicare HMO | Source: Ambulatory Visit | Attending: General Surgery | Admitting: General Surgery

## 2021-05-16 ENCOUNTER — Other Ambulatory Visit: Payer: Self-pay

## 2021-05-16 ENCOUNTER — Other Ambulatory Visit: Payer: Self-pay | Admitting: General Surgery

## 2021-05-16 DIAGNOSIS — Z170 Estrogen receptor positive status [ER+]: Secondary | ICD-10-CM

## 2021-05-16 DIAGNOSIS — R921 Mammographic calcification found on diagnostic imaging of breast: Secondary | ICD-10-CM | POA: Diagnosis not present

## 2021-05-16 DIAGNOSIS — C50512 Malignant neoplasm of lower-outer quadrant of left female breast: Secondary | ICD-10-CM

## 2021-05-16 DIAGNOSIS — R928 Other abnormal and inconclusive findings on diagnostic imaging of breast: Secondary | ICD-10-CM | POA: Diagnosis not present

## 2021-05-16 DIAGNOSIS — D0512 Intraductal carcinoma in situ of left breast: Secondary | ICD-10-CM | POA: Diagnosis not present

## 2021-05-19 ENCOUNTER — Encounter (HOSPITAL_COMMUNITY): Payer: Self-pay | Admitting: General Surgery

## 2021-05-19 ENCOUNTER — Telehealth: Payer: Self-pay | Admitting: Genetic Counselor

## 2021-05-19 ENCOUNTER — Ambulatory Visit: Payer: Self-pay | Admitting: Genetic Counselor

## 2021-05-19 DIAGNOSIS — D0512 Intraductal carcinoma in situ of left breast: Secondary | ICD-10-CM

## 2021-05-19 DIAGNOSIS — Z803 Family history of malignant neoplasm of breast: Secondary | ICD-10-CM

## 2021-05-19 DIAGNOSIS — Z1379 Encounter for other screening for genetic and chromosomal anomalies: Secondary | ICD-10-CM

## 2021-05-19 NOTE — Telephone Encounter (Signed)
Revealed negative genetic testing of pan-cancer panel.  Discussed that we do not know why she has breast cancer or why there is cancer in the family. It could be familial, due to a different gene that we are not testing, or maybe our current technology may not be able to pick something up.  It will be important for her to keep in contact with genetics to keep up with whether additional testing may be needed.   

## 2021-05-19 NOTE — H&P (Signed)
REFERRING PHYSICIAN:  Lennox Laity,*   PROVIDER:  Georgianne Fick, MD   MRN: [id number] DOB: 06/24/1951 DATE OF ENCOUNTER: 04/30/2021   Subjective    Chief Complaint: Breast Cancer       History of Present Illness: Kendra Thompson is a 70 y.o. female who is seen today as an office consultation at the request of Dr. Owens Shark for evaluation of Breast Cancer .     Pt is a 70 yo F who presents to the clinic with new left breast cancer dx 03/2021.  She had screening detected calcifications.  Diagnostic imaging was performed which showed 4.9 cm of linearly oriented calcifications in the lower outer left breast.  Biopsies were performed at the anterior and posterior extent and these showed intermediate grade DCIS with calcifications.  Hormone receptors were positive.     Pt is a G0P0. Menarche was age 63 and menopause was approximately age 4.  She took OCPs for around 1 year.       Review of Systems: A complete review of systems was obtained from the patient.  I have reviewed this information and discussed as appropriate with the patient.  See HPI as well for other ROS.   Review of Systems  Constitutional: Positive for fever (when she gets bronchitis).  HENT: Positive for sinus pain.   Respiratory: Positive for shortness of breath. Sputum production: occasional.   All other systems reviewed and are negative.       Medical History: Past Medical History      Past Medical History:  Diagnosis Date   Arthritis     Asthma, unspecified asthma severity, unspecified whether complicated, unspecified whether persistent     GERD (gastroesophageal reflux disease)     Hypertension     Seasonal allergies             Patient Active Problem List  Diagnosis   Malignant neoplasm of lower-outer quadrant of left breast of female, estrogen receptor positive (CMS-HCC)   Family history of cancer      Past Surgical History       Past Surgical History:  Procedure Laterality  Date   left hand fracture repair       ligament reconstruction and tendon interposition left thumb            Allergies  No Known Allergies           Current Outpatient Medications on File Prior to Visit  Medication Sig Dispense Refill   amLODIPine (NORVASC) 10 MG tablet Take 10 mg by mouth once daily       candesartan-hydrochlorothiazid 32-25 mg Tab Take 1 tablet by mouth once daily       ibuprofen (MOTRIN) 200 MG tablet Take 200 mg by mouth every 6 (six) hours as needed for Pain       multivitamin tablet Take 1 tablet by mouth once daily       pantoprazole (PROTONIX) 40 MG DR tablet Take 40 mg by mouth once daily       terbinafine HCL (LAMISIL) 250 mg tablet Take 250 mg by mouth once daily        No current facility-administered medications on file prior to visit.      Family History       Family History  Problem Relation Age of Onset   Breast cancer Mother     Breast cancer Sister     Stomach cancer Brother     Breast cancer Maternal Grandmother  Social History       Tobacco Use  Smoking Status Never Smoker  Smokeless Tobacco Never Used      Social History  Social History         Socioeconomic History   Marital status: Single  Tobacco Use   Smoking status: Never Smoker   Smokeless tobacco: Never Used  Substance and Sexual Activity   Alcohol use: Yes      Comment: 1 per month   Drug use: Never        Objective:         Vitals:    04/30/21 1847  BP: 110/68  Pulse: 90  Resp: 18  Temp: 36.6 C (97.8 F)  Weight: (!) 112.5 kg (248 lb)  Height: 172.7 cm ('5\' 8"'$ )    Body mass index is 37.71 kg/m.     Head:   Normocephalic and atraumatic.  Mouth/Throat: Oropharynx is clear and moist. No oropharyngeal exudate.  Eyes:   Conjunctivae are normal. Pupils are equal, round, and reactive to light. No scleral icterus.  Neck:   Normal range of motion. Neck supple. No tracheal deviation present. No thyromegaly present.  Cardiovascular: Normal  rate, regular rhythm, normal heart sounds and intact distal pulses.  Exam reveals no gallop and no friction rub.   No murmur heard. Breast: breasts relatively symmetric.  No palpable masses.  No skin dimpling.  No nipple retraction or nipple discharge.  No LAD.  Left lower outer breast tender with bruising.   Respiratory: Effort normal and breath sounds normal. No respiratory distress. No wheezes, rales or rhonchi.  No chest wall tenderness.  GI:       Soft. Bowel sounds are normal. Abdomen is soft, non tender, non distended.  No masses or hepatosplenomegaly is present. There is no rebound and no guarding.  Musculoskeletal: . Extremities are non tender and without deformity.  Lymphadenopathy:   No cervical or axillary adenopathy.  Neurological: Alert and oriented to person, place, and time. Coordination normal.  Skin:    Skin is warm and dry. No rash noted. No diaphoresis. No erythema. No pallor.  Psychiatric: Normal mood and affect.Behavior is normal. Judgment and thought content normal.      Labs, Imaging and Diagnostic Testing: Dx mammogram 04/11/21  EXAM:  DIGITAL DIAGNOSTIC UNILATERAL LEFT MAMMOGRAM WITH CAD   TECHNIQUE:  Left digital diagnostic mammography was performed. Mammographic  images were processed with CAD.   COMPARISON:  Previous exam(s).   ACR Breast Density Category b: There are scattered areas of  fibroglandular density.   FINDINGS:  Magnified views are performed of calcifications in the LOWER OUTER  QUADRANT of the LEFT breast. Magnified views demonstrate faint  pleomorphic calcifications standing 4.9 x 4.3 x 1.3 centimeters.  Some calcifications are scattered and pleomorphic. Other  calcifications are faint and linear. Recommend biopsy of the  anterior and posterior aspect of the calcifications.   IMPRESSION:  Indeterminate LEFT breast calcifications.   RECOMMENDATION:  Recommend 2 site stereotactic guided core biopsy of the LEFT breast.   I have discussed  the findings and recommendations with the patient.  If applicable, a reminder letter will be sent to the patient  regarding the next appointment.   BI-RADS CATEGORY  4: Suspicious.    Pathology 04/18/21 1. Breast, left, needle core biopsy, LOQ posterior edge, X - DUCTAL CARCINOMA IN SITU WITH CALCIFICATIONS. - SEE MICROSCOPIC DESCRIPTION. 2. Breast, left, needle core biopsy, LOQ anterior edge, ribbon clip - DUCTAL CARCINOMA IN SITU WITH NECROSIS  AND CALCIFICATIONS. - SEE MICROSCOPIC DESCRIPTION. Microscopic Comment 1. The DCIS has intermediate nuclear grade. Estrogen Receptor: 95%, POSITIVE, STRONG STAINING INTENSITY Progesterone Receptor: 80%, POSITIVE, STRONG STAINING INTENSITY   CBC, CMET essentially normal 04/30/21   Assessment and Plan:  Diagnoses and all orders for this visit:   Malignant neoplasm of lower-outer quadrant of left breast of female, estrogen receptor positive (CMS-HCC) Assessment & Plan: Pt has a new diagnosis of cTis left breast cancer.     .  Pt will get genetics, but not sure she would want to get mastectomies if positive.     We will get a breast MRI given the extent of disease and her family history.  I advised that at least 30% of patients require additional biopsies after an MRI.     She would like to have breast conservation if possible.  This would require a seed bracketed lumpectomy.  I discussed that these larger areas are more likely to need reexcision after surgery.     I reviewed risks of bleeding, infection, pain, dissatisfaction with her scar, lymphedema, possible need for additional procedures or surgeries, seroma, and more.  She would like to proceed as soon as possible after her MRI which is set up 7/11.     She does not want to pursue COMET.       Family history of cancer Assessment & Plan: Genetic testing

## 2021-05-20 ENCOUNTER — Ambulatory Visit (HOSPITAL_COMMUNITY)
Admission: RE | Admit: 2021-05-20 | Discharge: 2021-05-20 | Disposition: A | Discharge status: Home / Self Care | Payer: Medicare HMO | Attending: General Surgery | Admitting: General Surgery

## 2021-05-20 ENCOUNTER — Ambulatory Visit
Admission: RE | Admit: 2021-05-20 | Discharge: 2021-05-20 | Disposition: A | Discharge status: Home / Self Care | Payer: Medicare HMO | Source: Ambulatory Visit | Attending: General Surgery | Admitting: General Surgery

## 2021-05-20 ENCOUNTER — Other Ambulatory Visit: Payer: Self-pay

## 2021-05-20 ENCOUNTER — Other Ambulatory Visit: Payer: Self-pay | Admitting: General Surgery

## 2021-05-20 ENCOUNTER — Ambulatory Visit (HOSPITAL_COMMUNITY): Payer: Medicare HMO | Admitting: Anesthesiology

## 2021-05-20 ENCOUNTER — Encounter (HOSPITAL_COMMUNITY)
Admission: RE | Disposition: A | Discharge status: Home / Self Care | Payer: Self-pay | Source: Home / Self Care | Attending: General Surgery

## 2021-05-20 ENCOUNTER — Encounter (HOSPITAL_COMMUNITY): Payer: Self-pay | Admitting: General Surgery

## 2021-05-20 DIAGNOSIS — Z170 Estrogen receptor positive status [ER+]: Secondary | ICD-10-CM | POA: Insufficient documentation

## 2021-05-20 DIAGNOSIS — K219 Gastro-esophageal reflux disease without esophagitis: Secondary | ICD-10-CM | POA: Diagnosis not present

## 2021-05-20 DIAGNOSIS — R928 Other abnormal and inconclusive findings on diagnostic imaging of breast: Secondary | ICD-10-CM | POA: Diagnosis not present

## 2021-05-20 DIAGNOSIS — N6012 Diffuse cystic mastopathy of left breast: Secondary | ICD-10-CM | POA: Diagnosis not present

## 2021-05-20 DIAGNOSIS — C50512 Malignant neoplasm of lower-outer quadrant of left female breast: Secondary | ICD-10-CM | POA: Diagnosis not present

## 2021-05-20 DIAGNOSIS — I10 Essential (primary) hypertension: Secondary | ICD-10-CM | POA: Diagnosis not present

## 2021-05-20 DIAGNOSIS — D0512 Intraductal carcinoma in situ of left breast: Secondary | ICD-10-CM | POA: Insufficient documentation

## 2021-05-20 DIAGNOSIS — Z79899 Other long term (current) drug therapy: Secondary | ICD-10-CM | POA: Insufficient documentation

## 2021-05-20 DIAGNOSIS — N641 Fat necrosis of breast: Secondary | ICD-10-CM | POA: Diagnosis not present

## 2021-05-20 DIAGNOSIS — Z803 Family history of malignant neoplasm of breast: Secondary | ICD-10-CM | POA: Insufficient documentation

## 2021-05-20 DIAGNOSIS — N6489 Other specified disorders of breast: Secondary | ICD-10-CM | POA: Diagnosis not present

## 2021-05-20 DIAGNOSIS — C50912 Malignant neoplasm of unspecified site of left female breast: Secondary | ICD-10-CM | POA: Diagnosis not present

## 2021-05-20 HISTORY — PX: BREAST LUMPECTOMY: SHX2

## 2021-05-20 HISTORY — PX: BREAST LUMPECTOMY WITH RADIOACTIVE SEED LOCALIZATION: SHX6424

## 2021-05-20 SURGERY — BREAST LUMPECTOMY WITH RADIOACTIVE SEED LOCALIZATION
Anesthesia: General | Site: Breast | Laterality: Left

## 2021-05-20 MED ORDER — PROPOFOL 1000 MG/100ML IV EMUL
INTRAVENOUS | Status: AC
  Filled 2021-05-20: qty 100

## 2021-05-20 MED ORDER — LIDOCAINE HCL 1 % IJ SOLN
INTRAMUSCULAR | Status: DC | PRN
  Administered 2021-05-20: 30 mL

## 2021-05-20 MED ORDER — DEXAMETHASONE SODIUM PHOSPHATE 10 MG/ML IJ SOLN
INTRAMUSCULAR | Status: DC | PRN
Start: 2021-05-20 — End: 2021-05-20
  Administered 2021-05-20: 4 mg via INTRAVENOUS

## 2021-05-20 MED ORDER — CEFAZOLIN SODIUM-DEXTROSE 2-4 GM/100ML-% IV SOLN
2.0000 g | INTRAVENOUS | Status: AC
  Administered 2021-05-20: 2 g via INTRAVENOUS
  Filled 2021-05-20: qty 100

## 2021-05-20 MED ORDER — LIDOCAINE HCL (CARDIAC) PF 100 MG/5ML IV SOSY
PREFILLED_SYRINGE | INTRAVENOUS | Status: DC | PRN
  Administered 2021-05-20: 80 mg via INTRAVENOUS

## 2021-05-20 MED ORDER — LACTATED RINGERS IV SOLN: INTRAVENOUS | Status: DC

## 2021-05-20 MED ORDER — CHLORHEXIDINE GLUCONATE CLOTH 2 % EX PADS: 6.0000 | MEDICATED_PAD | Freq: Once | CUTANEOUS | Status: DC

## 2021-05-20 MED ORDER — PHENYLEPHRINE 40 MCG/ML (10ML) SYRINGE FOR IV PUSH (FOR BLOOD PRESSURE SUPPORT)
PREFILLED_SYRINGE | INTRAVENOUS | Status: DC | PRN
  Administered 2021-05-20: 80 ug via INTRAVENOUS
  Administered 2021-05-20: 120 ug via INTRAVENOUS
  Administered 2021-05-20: 200 ug via INTRAVENOUS

## 2021-05-20 MED ORDER — OXYCODONE HCL 5 MG PO TABS: 5.0000 mg | ORAL_TABLET | Freq: Four times a day (QID) | ORAL | 0 refills | Status: DC | PRN

## 2021-05-20 MED ORDER — LIDOCAINE-EPINEPHRINE 1 %-1:100000 IJ SOLN
INTRAMUSCULAR | Status: AC
  Filled 2021-05-20: qty 1

## 2021-05-20 MED ORDER — OXYCODONE HCL 5 MG/5ML PO SOLN
5.0000 mg | Freq: Once | ORAL | Status: DC | PRN
Start: 2021-05-20 — End: 2021-05-22

## 2021-05-20 MED ORDER — DEXAMETHASONE SODIUM PHOSPHATE 10 MG/ML IJ SOLN
INTRAMUSCULAR | Status: AC
  Filled 2021-05-20: qty 1

## 2021-05-20 MED ORDER — MIDAZOLAM HCL 2 MG/2ML IJ SOLN
INTRAMUSCULAR | Status: AC
  Filled 2021-05-20: qty 2

## 2021-05-20 MED ORDER — EPHEDRINE 5 MG/ML INJ
INTRAVENOUS | Status: AC
  Filled 2021-05-20: qty 5

## 2021-05-20 MED ORDER — FENTANYL CITRATE (PF) 100 MCG/2ML IJ SOLN: 25.0000 ug | INTRAMUSCULAR | Status: DC | PRN

## 2021-05-20 MED ORDER — BUPIVACAINE HCL (PF) 0.25 % IJ SOLN
INTRAMUSCULAR | Status: AC
  Filled 2021-05-20: qty 30

## 2021-05-20 MED ORDER — PROPOFOL 10 MG/ML IV BOLUS
INTRAVENOUS | Status: DC | PRN
  Administered 2021-05-20: 200 mg via INTRAVENOUS

## 2021-05-20 MED ORDER — 0.9 % SODIUM CHLORIDE (POUR BTL) OPTIME
TOPICAL | Status: DC | PRN
  Administered 2021-05-20: 1000 mL

## 2021-05-20 MED ORDER — CHLORHEXIDINE GLUCONATE 0.12 % MT SOLN
15.0000 mL | Freq: Once | OROMUCOSAL | Status: AC
  Administered 2021-05-20: 15 mL via OROMUCOSAL
  Filled 2021-05-20: qty 15

## 2021-05-20 MED ORDER — ORAL CARE MOUTH RINSE: 15.0000 mL | Freq: Once | OROMUCOSAL | Status: AC

## 2021-05-20 MED ORDER — FENTANYL CITRATE (PF) 250 MCG/5ML IJ SOLN
INTRAMUSCULAR | Status: AC
  Filled 2021-05-20: qty 5

## 2021-05-20 MED ORDER — OXYCODONE HCL 5 MG PO TABS: 5.0000 mg | ORAL_TABLET | Freq: Once | ORAL | Status: DC | PRN

## 2021-05-20 MED ORDER — ACETAMINOPHEN 500 MG PO TABS
1000.0000 mg | ORAL_TABLET | ORAL | Status: AC
  Administered 2021-05-20: 1000 mg via ORAL
  Filled 2021-05-20: qty 2

## 2021-05-20 MED ORDER — LIDOCAINE 2% (20 MG/ML) 5 ML SYRINGE
INTRAMUSCULAR | Status: AC
  Filled 2021-05-20: qty 5

## 2021-05-20 MED ORDER — MIDAZOLAM HCL 5 MG/5ML IJ SOLN
INTRAMUSCULAR | Status: DC | PRN
  Administered 2021-05-20 (×2): 1 mg via INTRAVENOUS

## 2021-05-20 MED ORDER — PROMETHAZINE HCL 25 MG/ML IJ SOLN: 6.2500 mg | INTRAMUSCULAR | Status: DC | PRN

## 2021-05-20 MED ORDER — EPHEDRINE SULFATE 50 MG/ML IJ SOLN
INTRAMUSCULAR | Status: DC | PRN
  Administered 2021-05-20: 10 mg via INTRAVENOUS

## 2021-05-20 MED ORDER — FENTANYL CITRATE (PF) 100 MCG/2ML IJ SOLN
INTRAMUSCULAR | Status: DC | PRN
  Administered 2021-05-20: 100 ug via INTRAVENOUS
  Administered 2021-05-20 (×2): 50 ug via INTRAVENOUS

## 2021-05-20 SURGICAL SUPPLY — 45 items
ADH SKN CLS APL DERMABOND .7 (GAUZE/BANDAGES/DRESSINGS) ×1
APL PRP STRL LF DISP 70% ISPRP (MISCELLANEOUS) ×1
BAG COUNTER SPONGE SURGICOUNT (BAG) ×2 IMPLANT
BAG SPNG CNTER NS LX DISP (BAG) ×1
BINDER BREAST LRG (GAUZE/BANDAGES/DRESSINGS) IMPLANT
BINDER BREAST XLRG (GAUZE/BANDAGES/DRESSINGS) IMPLANT
BINDER BREAST XXLRG (GAUZE/BANDAGES/DRESSINGS) ×2 IMPLANT
BLADE SURG 10 STRL SS (BLADE) ×2 IMPLANT
CANISTER SUCT 3000ML PPV (MISCELLANEOUS) IMPLANT
CHLORAPREP W/TINT 26 (MISCELLANEOUS) ×2 IMPLANT
CLIP VESOCCLUDE LG 6/CT (CLIP) ×2 IMPLANT
CLSR STERI-STRIP ANTIMIC 1/2X4 (GAUZE/BANDAGES/DRESSINGS) ×2 IMPLANT
COVER PROBE W GEL 5X96 (DRAPES) ×2 IMPLANT
COVER SURGICAL LIGHT HANDLE (MISCELLANEOUS) ×2 IMPLANT
DERMABOND ADVANCED (GAUZE/BANDAGES/DRESSINGS) ×1
DERMABOND ADVANCED .7 DNX12 (GAUZE/BANDAGES/DRESSINGS) ×1 IMPLANT
DEVICE DUBIN SPECIMEN MAMMOGRA (MISCELLANEOUS) ×2 IMPLANT
DRAPE CHEST BREAST 15X10 FENES (DRAPES) ×2 IMPLANT
DRSG PAD ABDOMINAL 8X10 ST (GAUZE/BANDAGES/DRESSINGS) ×2 IMPLANT
ELECT COATED BLADE 2.86 ST (ELECTRODE) ×2 IMPLANT
ELECT REM PT RETURN 9FT ADLT (ELECTROSURGICAL) ×2
ELECTRODE REM PT RTRN 9FT ADLT (ELECTROSURGICAL) ×1 IMPLANT
GAUZE SPONGE 4X4 12PLY STRL LF (GAUZE/BANDAGES/DRESSINGS) ×2 IMPLANT
GLOVE SURG ENC MOIS LTX SZ6 (GLOVE) ×2 IMPLANT
GLOVE SURG UNDER LTX SZ6.5 (GLOVE) ×2 IMPLANT
GOWN STRL REUS W/ TWL LRG LVL3 (GOWN DISPOSABLE) ×1 IMPLANT
GOWN STRL REUS W/TWL 2XL LVL3 (GOWN DISPOSABLE) ×2 IMPLANT
GOWN STRL REUS W/TWL LRG LVL3 (GOWN DISPOSABLE) ×2
KIT BASIN OR (CUSTOM PROCEDURE TRAY) ×2 IMPLANT
KIT MARKER MARGIN INK (KITS) ×2 IMPLANT
LIGHT WAVEGUIDE WIDE FLAT (MISCELLANEOUS) IMPLANT
NEEDLE HYPO 25GX1X1/2 BEV (NEEDLE) ×2 IMPLANT
NS IRRIG 1000ML POUR BTL (IV SOLUTION) IMPLANT
PACK GENERAL/GYN (CUSTOM PROCEDURE TRAY) ×2 IMPLANT
PAD ABD 7.5X8 STRL (GAUZE/BANDAGES/DRESSINGS) ×2 IMPLANT
STRIP CLOSURE SKIN 1/2X4 (GAUZE/BANDAGES/DRESSINGS) ×2 IMPLANT
SUT MNCRL AB 4-0 PS2 18 (SUTURE) ×2 IMPLANT
SUT SILK 2 0 SH (SUTURE) IMPLANT
SUT VIC AB 2-0 SH 27 (SUTURE) ×2
SUT VIC AB 2-0 SH 27XBRD (SUTURE) ×1 IMPLANT
SUT VIC AB 3-0 SH 27 (SUTURE) ×2
SUT VIC AB 3-0 SH 27X BRD (SUTURE) ×1 IMPLANT
SYR CONTROL 10ML LL (SYRINGE) ×2 IMPLANT
TOWEL GREEN STERILE (TOWEL DISPOSABLE) ×2 IMPLANT
TOWEL GREEN STERILE FF (TOWEL DISPOSABLE) ×2 IMPLANT

## 2021-05-20 NOTE — Interval H&P Note (Signed)
History and Physical Interval Note:  05/20/2021 12:03 PM  Kendra Thompson  has presented today for surgery, with the diagnosis of LEFT BREAST CANCER.  The various methods of treatment have been discussed with the patient and family. After consideration of risks, benefits and other options for treatment, the patient has consented to  Procedure(s): LEFT BREAST BRACKETED LUMPECTOMY WITH RADIOACTIVE SEED LOCALIZATION (Left) as a surgical intervention.  The patient's history has been reviewed, patient examined, no change in status, stable for surgery.  I have reviewed the patient's chart and labs.  Questions were answered to the patient's satisfaction.     Stark Klein

## 2021-05-20 NOTE — Transfer of Care (Signed)
Immediate Anesthesia Transfer of Care Note  Patient: Kendra Thompson  Procedure(s) Performed: LEFT BREAST BRACKETED LUMPECTOMY WITH RADIOACTIVE SEED LOCALIZATION x3 (Left: Breast)  Patient Location: PACU  Anesthesia Type:General  Level of Consciousness: awake, alert , oriented and patient cooperative  Airway & Oxygen Therapy: Patient Spontanous Breathing  Post-op Assessment: Report given to RN, Post -op Vital signs reviewed and stable and Patient moving all extremities X 4  Post vital signs: Reviewed and stable  Last Vitals:  Vitals Value Taken Time  BP 121/58 05/20/21 1529  Temp    Pulse 100 05/20/21 1530  Resp 17 05/20/21 1530  SpO2 95 % 05/20/21 1530  Vitals shown include unvalidated device data.  Last Pain:  Vitals:   05/20/21 1117  TempSrc:   PainSc: 0-No pain         Complications: No notable events documented.

## 2021-05-20 NOTE — Discharge Instructions (Addendum)
[facility] Office Phone Number [phone]  BREAST BIOPSY/ PARTIAL MASTECTOMY: POST OP INSTRUCTIONS  Always review your discharge instruction sheet given to you by the facility where your surgery was performed.  IF YOU HAVE DISABILITY OR FAMILY LEAVE FORMS, YOU MUST BRING THEM TO THE OFFICE FOR PROCESSING.  DO NOT GIVE THEM TO YOUR DOCTOR.  A prescription for pain medication may be given to you upon discharge.  Take your pain medication as prescribed, if needed.  If narcotic pain medicine is not needed, then you may take acetaminophen (Tylenol) or ibuprofen (Advil) as needed. Take your usually prescribed medications unless otherwise directed If you need a refill on your pain medication, please contact your pharmacy.  They will contact our office to request authorization.  Prescriptions will not be filled after 5pm or on week-ends. You should eat very light the first 24 hours after surgery, such as soup, crackers, pudding, etc.  Resume your normal diet the day after surgery. Most patients will experience some swelling and bruising in the breast.  Ice packs and a good support bra will help.  Swelling and bruising can take several days to resolve.  It is common to experience some constipation if taking pain medication after surgery.  Increasing fluid intake and taking a stool softener will usually help or prevent this problem from occurring.  A mild laxative (Milk of Magnesia or Miralax) should be taken according to package directions if there are no bowel movements after 48 hours. Unless discharge instructions indicate otherwise, you may remove your bandages 48 hours after surgery, and you may shower at that time.  You may have steri-strips (small skin tapes) in place directly over the incision.  These strips should be left on the skin for 7-10 days.   Any sutures or staples will be removed at the office during your follow-up visit. ACTIVITIES:  You may resume regular daily activities  (gradually increasing) beginning the next day.  Wearing a good support bra or sports bra (or the breast binder) minimizes pain and swelling.  You may have sexual intercourse when it is comfortable. You may drive when you no longer are taking prescription pain medication, you can comfortably wear a seatbelt, and you can safely maneuver your car and apply brakes. RETURN TO WORK:  __________1 week_______________ You should see your doctor in the office for a follow-up appointment approximately two weeks after your surgery.  Your doctor's nurse will typically make your follow-up appointment when she calls you with your pathology report.  Expect your pathology report 2-3 business days after your surgery.  You may call to check if you do not hear from us after three days.   WHEN TO CALL YOUR DOCTOR: Fever over 101.0 Nausea and/or vomiting. Extreme swelling or bruising. Continued bleeding from incision. Increased pain, redness, or drainage from the incision.  The clinic staff is available to answer your questions during regular business hours.  Please don't hesitate to call and ask to speak to one of the nurses for clinical concerns.  If you have a medical emergency, go to the nearest emergency room or call 911.  A surgeon from [facility] is always on call at the hospital.  For further questions, please visit [email or web address]   

## 2021-05-20 NOTE — Op Note (Signed)
Left Breast Radioactive seed bracketed lumpectomy  Indications: This patient presents with history of left breast cancer, LOQ quadrant, cTis,  receptors +/+  Pre-operative Diagnosis: left breast cancer  Post-operative Diagnosis: left breast cancer  Surgeon: Stark Klein   Anesthesia: General endotracheal anesthesia  ASA Class: 3  Procedure Details  The patient was seen in the Holding Room. The risks, benefits, complications, treatment options, and expected outcomes were discussed with the patient. The possibilities of bleeding, infection, the need for additional procedures, failure to diagnose a condition, and creating a complication requiring other procedures or operations were discussed with the patient. The patient concurred with the proposed plan, giving informed consent.  The site of surgery properly noted/marked. The patient was taken to Operating Room # 2, identified, and the procedure verified as left breast seed bracketed lumpectomy.  The left breast and chest were prepped and draped in standard fashion. A transverse lateral incision was made near the previously placed radioactive seeds.  Dissection was carried down around the points of maximum signal intensity. The cautery was used to perform the dissection.   The specimen was inked with the margin marker paint kit.    Specimen radiography confirmed inclusion of two clips and one seed.  A posterior margin was taken that included the third seed and a clip.  An additional anterior margin was taken to clear the tissue in front of the anterior seed.  The background signal in the breast was zero.  Hemostasis was achieved with cautery.  The cavity was marked with clips on each border other than the anterior border.  The wound was irrigated and closed with 3-0 vicryl interrupted deep dermal sutures and 4-0 monocryl running subcuticular suture.      Sterile dressings were applied. At the end of the operation, all sponge, instrument, and needle  counts were correct.   Findings: Two seeds and ribbon clip in specimen one.  One seed and x clip in posterior margin.  Anterior margin without clips.  Coil clip should be anterior and inferior to the anterior seed, but these margins were cleared all the way to skin without finding the seed.    Anterior/inferior/lateral margins are skin, posterior margin is pectoralis.     Estimated Blood Loss:  min         Specimens: left breast tissue with seeds, additional posterior margin with seed and additional anterior margin.           Complications:  None; patient tolerated the procedure well.         Disposition: PACU - hemodynamically stable.         Condition: stable

## 2021-05-20 NOTE — Anesthesia Preprocedure Evaluation (Addendum)
Anesthesia Evaluation  Patient identified by MRN, date of birth, ID band Patient awake    Reviewed: Allergy & Precautions, NPO status , Patient's Chart, lab work & pertinent test results  Airway Mallampati: II  TM Distance: >3 FB Neck ROM: Full    Dental no notable dental hx.    Pulmonary asthma ,    Pulmonary exam normal breath sounds clear to auscultation       Cardiovascular Exercise Tolerance: Good hypertension, Normal cardiovascular exam Rhythm:Regular Rate:Normal  Normal sinus rhythm Normal ECG Confirmed by Eleonore Chiquito [phone]) on 05/07/2021 9:25:27 PM   Neuro/Psych  Headaches, negative psych ROS   GI/Hepatic Neg liver ROS, GERD  ,  Endo/Other  BMI 37  Renal/GU negative Renal ROS  negative genitourinary   Musculoskeletal  (+) Arthritis ,   Abdominal   Peds negative pediatric ROS (+)  Hematology negative hematology ROS (+)   Anesthesia Other Findings Breast cancer  Reproductive/Obstetrics negative OB ROS                            Anesthesia Physical Anesthesia Plan  ASA: 3  Anesthesia Plan: General   Post-op Pain Management:    Induction: Intravenous  PONV Risk Score and Plan: 3 and Treatment may vary due to age or medical condition  Airway Management Planned: LMA  Additional Equipment: None  Intra-op Plan:   Post-operative Plan: Extubation in OR  Informed Consent: I have reviewed the patients History and Physical, chart, labs and discussed the procedure including the risks, benefits and alternatives for the proposed anesthesia with the patient or authorized representative who has indicated his/her understanding and acceptance.       Plan Discussed with: CRNA, Anesthesiologist and Surgeon  Anesthesia Plan Comments:         Anesthesia Quick Evaluation

## 2021-05-20 NOTE — Anesthesia Procedure Notes (Signed)
Procedure Name: LMA Insertion Date/Time: 05/20/2021 2:08 PM Performed by: Renato Shin, CRNA Pre-anesthesia Checklist: Patient identified, Emergency Drugs available, Suction available and Patient being monitored Patient Re-evaluated:Patient Re-evaluated prior to induction Oxygen Delivery Method: Circle system utilized Preoxygenation: Pre-oxygenation with 100% oxygen Induction Type: IV induction LMA: LMA with gastric port inserted LMA Size: 4.0 Number of attempts: 1 Placement Confirmation: positive ETCO2 and breath sounds checked- equal and bilateral Tube secured with: Tape Dental Injury: Teeth and Oropharynx as per pre-operative assessment

## 2021-05-20 NOTE — Progress Notes (Signed)
HPI:  Kendra Thompson was previously seen in the [facility] clinic due to a personal and family history of breast cancer and concerns regarding a hereditary predisposition to cancer. Please refer to our prior cancer genetics clinic note for more information regarding our discussion, assessment and recommendations, at the time. Kendra Thompson's recent genetic test results were disclosed to her, as were recommendations warranted by these results. These results and recommendations are discussed in more detail below.  CANCER HISTORY:  Oncology History  Ductal carcinoma in situ (DCIS) of left breast  04/24/2021 Initial Diagnosis   Ductal carcinoma in situ (DCIS) of left breast    04/30/2021 Cancer Staging   Staging form: Breast, AJCC 8th Edition - Clinical stage from 04/30/2021: Stage 0 (cTis (DCIS), cN0, cM0, ER+, PR+) - Signed by Chauncey Cruel, MD on 04/30/2021  Stage prefix: Initial diagnosis  Nuclear grade: G2    05/07/2021 Genetic Testing   Negative hereditary cancer genetic testing: no pathogenic variants detected on [facility] BRCAPlus Panel.  The report date is May 07, 2021.  The BRCAplus panel offered by [employer] and includes sequencing and deletion/duplication analysis for the following 8 genes: ATM, BRCA1, BRCA2, CDH1, CHEK2, PALB2, PTEN, and TP53.  Results of pan-cancer panel pending.      FAMILY HISTORY:  We obtained a detailed, 4-generation family history.  Significant diagnoses are listed below: Family History  Problem Relation Age of Onset   Breast cancer Mother 8   Esophageal cancer Brother 74   Other Brother 23       neuroendocrine tumor   Breast cancer Maternal Grandmother        dx after 56       Kendra Thompson is unaware of previous family history of genetic testing for hereditary cancer risks. There is no reported Ashkenazi Jewish ancestry. There is no known consanguinity.    GENETIC TEST RESULTS: Genetic testing reported out on May 14, 2021.   The [facility] CancerNext-Expanded +RNAinsight Panel found no pathogenic mutations. The CancerNext-Expanded gene panel offered by [facility] and includes sequencing, rearrangement, and RNA analysis for the following 77 genes: AIP, ALK, APC, ATM, AXIN2, BAP1, BARD1, BLM, BMPR1A, BRCA1, BRCA2, BRIP1, CDC73, CDH1, CDK4, CDKN1B, CDKN2A, CHEK2, CTNNA1, DICER1, FANCC, FH, FLCN, GALNT12, KIF1B, LZTR1, MAX, MEN1, MET, MLH1, MSH2, MSH3, MSH6, MUTYH, NBN, NF1, NF2, NTHL1, PALB2, PHOX2B, PMS2, POT1, PRKAR1A, PTCH1, PTEN, RAD51C, RAD51D, RB1, RECQL, RET, SDHA, SDHAF2, SDHB, SDHC, SDHD, SMAD4, SMARCA4, SMARCB1, SMARCE1, STK11, SUFU, TMEM127, TP53, TSC1, TSC2, VHL and XRCC2 (sequencing and deletion/duplication); EGFR, EGLN1, HOXB13, KIT, MITF, PDGFRA, POLD1, and POLE (sequencing only); EPCAM and GREM1 (deletion/duplication only).   The test report has been scanned into EPIC and is located under the Molecular Pathology section of the Results Review tab.  A portion of the result report is included below for reference.     We discussed with Kendra Thompson that because current genetic testing is not perfect, it is possible there may be a gene mutation in one of these genes that current testing cannot detect, but that chance is small.  We also discussed, that there could be another gene that has not yet been discovered, or that we have not yet tested, that is responsible for the cancer diagnoses in the family. It is also possible there is a hereditary cause for the cancer in the family that Kendra Thompson did not inherit and therefore was not identified in her testing.  Therefore, it is important to remain in touch with  cancer genetics in the future so that we can continue to offer Kendra Thompson the most up to date genetic testing.    ADDITIONAL GENETIC TESTING: We discussed with Kendra Thompson that her genetic testing was fairly extensive.  If there are genes identified to increase cancer risk that can be analyzed in the future,  we would be happy to discuss and coordinate this testing at that time.    CANCER SCREENING RECOMMENDATIONS: Kendra Thompson's test result is considered negative (normal).  This means that we have not identified a hereditary cause for her personal history of cancer at this time. Most cancers happen by chance and this negative test suggests that her cancer may fall into this category.    While reassuring, this does not definitively rule out a hereditary predisposition to cancer. It is still possible that there could be genetic mutations that are undetectable by current technology. There could be genetic mutations in genes that have not been tested or identified to increase cancer risk.  Therefore, it is recommended she continue to follow the cancer management and screening guidelines provided by her oncology and primary healthcare provider.   An individual's cancer risk and medical management are not determined by genetic test results alone. Overall cancer risk assessment incorporates additional factors, including personal medical history, family history, and any available genetic information that may result in a personalized plan for cancer prevention and surveillance  RECOMMENDATIONS FOR FAMILY MEMBERS:  Individuals in this family might be at some increased risk of developing cancer, over the general population risk, simply due to the family history of cancer.  We recommended women in this family have a yearly mammogram beginning at age 29, or 19 years younger than the earliest onset of cancer, an annual clinical breast exam, and perform monthly breast self-exams. Women in this family should also have a gynecological exam as recommended by their primary provider. Family members should be referred for colonoscopy starting at age 54.   It is also possible there is a hereditary cause for the cancer in Kendra Thompson family that she did not inherit and therefore was not identified in her.  Based on Ms.  Thompson family history, we recommended her sister, who was diagnosed with possible DCIS at age 4, have genetic counseling and testing. Kendra Thompson will let us know if we can be of any assistance in coordinating genetic counseling and/or testing for this family member.   FOLLOW-UP: Lastly, we discussed with Kendra Thompson that cancer genetics is a rapidly advancing field and it is possible that new genetic tests will be appropriate for her and/or her family members in the future. We encouraged her to remain in contact with cancer genetics on an annual basis so we can update her personal and family histories and let her know of advances in cancer genetics that may benefit this family.   Our contact number was provided. Kendra Thompson's questions were answered to her satisfaction, and she knows she is welcome to call us at anytime with additional questions or concerns.     Derian Pfost M. Joette Catching, [place], [facility] Genetic Counselor [email or web address] (P) [phone]

## 2021-05-21 ENCOUNTER — Encounter (HOSPITAL_COMMUNITY): Payer: Self-pay | Admitting: General Surgery

## 2021-05-21 NOTE — Anesthesia Postprocedure Evaluation (Signed)
Anesthesia Post Note  Patient: Kendra Thompson  Procedure(s) Performed: LEFT BREAST BRACKETED LUMPECTOMY WITH RADIOACTIVE SEED LOCALIZATION x3 (Left: Breast)     Patient location during evaluation: PACU Anesthesia Type: General Level of consciousness: awake Pain management: pain level controlled Vital Signs Assessment: post-procedure vital signs reviewed and stable Respiratory status: spontaneous breathing and respiratory function stable Cardiovascular status: stable Postop Assessment: no apparent nausea or vomiting Anesthetic complications: no   No notable events documented.  Last Vitals:  Vitals:   05/20/21 1545 05/20/21 1551  BP: 132/70 (!) 129/59  Pulse: 96 100  Resp: 15 18  Temp:  36.7 C  SpO2: 94% 94%    Last Pain:  Vitals:   05/20/21 1530  TempSrc:   PainSc: 0-No pain                 Merlinda Frederick

## 2021-05-26 LAB — SURGICAL PATHOLOGY

## 2021-05-27 ENCOUNTER — Encounter: Payer: Self-pay | Admitting: *Deleted

## 2021-05-28 ENCOUNTER — Other Ambulatory Visit: Payer: Self-pay | Admitting: Oncology

## 2021-05-29 ENCOUNTER — Telehealth: Payer: Self-pay

## 2021-05-29 NOTE — Telephone Encounter (Signed)
Patient calls nurse line regarding COVID exposure on Sunday, 7/31. Patient has been recently diagnosed with breast cancer and last week had a lumpectomy.   Patient is currently asymptomatic.   Advised that patient do a home test tomorrow, per CDC guidelines. Patient will call back if she becomes symptomatic or is positive.   Talbot Grumbling, RN

## 2021-06-16 ENCOUNTER — Encounter: Payer: Self-pay | Admitting: Radiation Oncology

## 2021-06-16 DIAGNOSIS — I890 Lymphedema, not elsewhere classified: Secondary | ICD-10-CM | POA: Insufficient documentation

## 2021-06-16 NOTE — Progress Notes (Signed)
Patient states doing well. Denies pain, fatigue, skin changes. Expresses mild swelling, and tenderness, but w/ a good range of motion.   Meaningful use questions complete and patient notified of her 1:30pm telephone appointment on 06/18/21 and expressed understanding.

## 2021-06-18 ENCOUNTER — Ambulatory Visit
Admission: RE | Admit: 2021-06-18 | Discharge: 2021-06-18 | Disposition: A | Discharge status: Home / Self Care | Payer: Medicare HMO | Source: Ambulatory Visit | Attending: Radiation Oncology | Admitting: Radiation Oncology

## 2021-06-18 DIAGNOSIS — D0512 Intraductal carcinoma in situ of left breast: Secondary | ICD-10-CM

## 2021-06-18 NOTE — Progress Notes (Signed)
Radiation Oncology         [phone] ________________________________  Initial Outpatient Consultation - Conducted via telephone due to current COVID-19 concerns for limiting patient exposure  I spoke with the patient to conduct this consult visit via telephone to spare the patient unnecessary potential exposure in the healthcare setting during the current COVID-19 pandemic. The patient was notified in advance and was offered a [place] meeting to allow for face to face communication but unfortunately reported that they did not have the appropriate resources/technology to support such a visit and instead preferred to proceed with a telephone consult.     Name: Kendra Thompson        MRN: [id number]:[id number]  Date of Service: 06/18/2021 DOB: Sep 06, 1951  [id number], Marzetta Merino, MD  Magrinat, Virgie Dad, MD     REFERRING PHYSICIAN: Magrinat, Virgie Dad, MD   DIAGNOSIS: The encounter diagnosis was Ductal carcinoma in situ (DCIS) of left breast.   HISTORY OF PRESENT ILLNESS: Kendra Thompson is a 70 y.o. female with a diagnosis of left breast cancer.The patient was found to have a suspicious area of calcifications on screening mammogram.  In maximum dimension, these measured approximately 4.9 cm.  A biopsy was performed which revealed a grade 2 DCIS.  Receptor studies indicated that the tumor is estrogen receptor positive and progesterone receptor positive.  She had an MRI on 05/05/21 that showed an additional finding of linear non mass enhancement in the lateral retorareolar left breast measuring 1.5 cm. 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  She has undergone left breast lumpectomy on 05/20/2021.  Final pathology revealed intermediate to high-grade DCIS with necrosis and calcifications measuring 3.3 cm.  Her inferior margin was 1 mm in distance and the final posterior margin was 2 mm.  Additional anterior margin excision was negative for disease but consistent with benign fibrocystic change.   She is seen today to discuss proceeding with radiotherapy and is scheduled for simulation as well.     PREVIOUS RADIATION THERAPY: No   PAST MEDICAL HISTORY:  Past Medical History:  Diagnosis Date   Allergy 1971   Arthritis 2017   Asthma 1971   has not been present for several years   Breast cancer ([place]) 04/18/2021   Cataract 2020   Family history of breast cancer 04/30/2021   GERD (gastroesophageal reflux disease)    Headache    Migraines occasionally   Hypertension    Seasonal allergies    Wears glasses        PAST SURGICAL HISTORY: Past Surgical History:  Procedure Laterality Date   BREAST LUMPECTOMY WITH RADIOACTIVE SEED LOCALIZATION Left 05/20/2021   Procedure: LEFT BREAST BRACKETED LUMPECTOMY WITH RADIOACTIVE SEED LOCALIZATION x3;  Surgeon: Stark Klein, MD;  Location: [place];  Service: General;  Laterality: Left;   CLOSED REDUCTION FINGER WITH PERCUTANEOUS PINNING Left 07/25/2013   Procedure: CLOSED REDUCTION FINGER WITH PERCUTANEOUS PINNING LEFT SMALL METACARPAL;  Surgeon: Tennis Must, MD;  Location: [place];  Service: Orthopedics;  Laterality: Left;   COLONOSCOPY  2013,2014   DIAGNOSTIC LAPAROSCOPY  1992   explor-   DILATION AND CURETTAGE OF UTERUS     FRACTURE SURGERY  2014   HAND SURGERY  02/2020     FAMILY HISTORY:  Family History  Problem Relation Age of Onset   Breast cancer Mother 26   Arthritis Mother    Cancer Mother    Depression Mother    Heart disease Mother    Hypertension  Mother    CAD Father        Diagnosed in early 63s   Arthritis Father    Hearing loss Father    Heart disease Father    Esophageal cancer Brother 4   Other Brother 77       neuroendocrine tumor   Alcohol abuse Brother    Breast cancer Maternal Grandmother        dx after 42   Diabetes Maternal Grandmother    Heart disease Maternal Grandmother    Hypertension Maternal Grandmother    Colon cancer Neg Hx      SOCIAL HISTORY:  reports that  she has never smoked. She has never used smokeless tobacco. She reports that she does not currently use alcohol. She reports that she does not use drugs. The patient is retired from working at [employer] in [place].   ALLERGIES: Latex and Tape   MEDICATIONS:  Current Outpatient Medications  Medication Sig Dispense Refill   amLODipine (NORVASC) 10 MG tablet TAKE 1 TABLET BY MOUTH EVERY DAY (Patient taking differently: Take 10 mg by mouth daily.) 90 tablet 3   Candesartan Cilexetil-HCTZ 32-25 MG TABS TAKE 1 TABLET BY MOUTH EVERY DAY (Patient taking differently: Take 1 tablet by mouth daily.) 90 tablet 3   fexofenadine (ALLEGRA) 180 MG tablet Take 180 mg by mouth daily as needed for allergies or rhinitis.     ibuprofen (ADVIL) 200 MG tablet Take 200-600 mg by mouth every 6 (six) hours as needed for headache or moderate pain.     MULTIPLE VITAMIN PO Take 1 tablet by mouth daily.     Olopatadine HCl (PATADAY OP) Place 1 drop into both eyes daily as needed (allergies).     pantoprazole (PROTONIX) 40 MG tablet TAKE 1 TABLET BY MOUTH EVERY DAY (Patient taking differently: Take 40 mg by mouth daily.) 90 tablet 3   oxyCODONE (OXY IR/ROXICODONE) 5 MG immediate release tablet Take 1 tablet (5 mg total) by mouth every 6 (six) hours as needed for severe pain. (Patient not taking: Reported on 06/16/2021) 8 tablet 0   terbinafine (LAMISIL) 250 MG tablet Take 1 tablet (250 mg total) by mouth daily. (Patient not taking: Reported on 06/16/2021) 90 tablet 0   No current facility-administered medications for this encounter.     REVIEW OF SYSTEMS: On review of systems, the patient reports that she is doing very well overall but over the weekend had some swelling of the lateral aspect of the breast that was noticeable when she moved her left arm. She saw Dr. Barry Dienes and she recommended PT evaluation. She otherwise is feeling quite well and thinks that some of the swelling has improved since wearing a  loose fitting bra and feels that the texture of the skin has improved. No other complaints are verbalized.      PHYSICAL EXAM:  Unable to assess due to encounter type.    ECOG = 1  0 - Asymptomatic (Fully active, able to carry on all predisease activities without restriction)  1 - Symptomatic but completely ambulatory (Restricted in physically strenuous activity but ambulatory and able to carry out work of a light or sedentary nature. For example, light housework, office work)  2 - Symptomatic, <50% in bed during the day (Ambulatory and capable of all self care but unable to carry out any work activities. Up and about more than 50% of waking hours)  3 - Symptomatic, >50% in bed, but not bedbound (Capable of only limited  self-care, confined to bed or chair 50% or more of waking hours)  4 - Bedbound (Completely disabled. Cannot carry on any self-care. Totally confined to bed or chair)  5 - Death   Eustace Pen MM, Creech RH, Tormey DC, et al. [phone]). "Toxicity and response criteria of the [facility] Group". [place] Oncol. 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 EXAM: SPECIMEN RADIOGRAPH OF THE LEFT BREAST COMPARISON:  Previous exam(s). FINDINGS: Status post excision of the LEFT breast. The radioactive seed and the X shaped biopsy marker clip are present in the non-compressed  specimen. The seed is intact. This was discussed directly with Dr. Barry Dienes in the operating room at the time of interpretation on 05/20/2021 at 2:58 p.m. We also discussed the fact that the dumbbell shaped biopsy marking clip associated with the [facility] in the anterior breast may have fallen out during specimen transfer, as the clip was immediately adjacent to the radioactive seed which was present in the initial specimen radiograph. IMPRESSION: Specimen radiograph of the LEFT breast. Electronically Signed   By: Evangeline Dakin M.D.   On: 05/20/2021 15:02  MM Breast Surgical Specimen  Result Date: 05/20/2021 CLINICAL DATA:  70 year old with biopsy-proven DCIS involving the LOWER OUTER QUADRANT of the LEFT breast and biopsy-proven ALH involving the subareolar location. EXAM: SPECIMEN RADIOGRAPH OF THE LEFT BREAST COMPARISON:  Previous exam(s). FINDINGS: Status post excision of the LEFT breast. Two of the radioactive seeds and the ribbon shaped tissue marker clip are present in the specimen, completely intact, and are marked for pathology. This was discussed with the operating room nurse at the time of interpretation on 05/20/2021 at 2:46 p.m. IMPRESSION: Specimen radiograph of the LEFT breast. Electronically Signed   By: Evangeline Dakin M.D.   On: 05/20/2021 14:48      IMPRESSION/PLAN: 1. Intermediate to High Grade, ER/PR positive DCIS of the left breast. Dr. Lisbeth Renshaw has reviewed her final pathology findings and the patient and I reviewed the nature of noninvasive breast disease. She seems to be healing well from her lumpectomy surgery. I reiterated the rationale for external radiotherapy to the breast  to reduce risks of local recurrence followed by antiestrogen therapy. We discussed the risks, benefits, short, and long term effects of radiotherapy, as well as the curative intent, and the patient is interested in proceeding. Dr. Lisbeth Renshaw would offer a 4 week course of radiotherapy to the left breast with deep  inspiration breath hold technique. Given concerns for edema of the breast we will delay simulation until 07/01/21 so she can be evaluated and work with PT to improve this. 2. Breast lymphedema. The patient will be referred for urgent evaluation so we can make improvements in the volume of her breast prior to proceeding with radiation.  Given current  concerns for patient exposure during the COVID-19 pandemic, this encounter was conducted via telephone.  The patient has provided two factor identification and has given verbal consent for this type of encounter and has been advised to only accept a meeting of this type in a secure network environment. The time spent during this encounter was 45 minutes including preparation, discussion, and coordination of the patient's care. The attendants for this meeting include Hayden Pedro  and Elba Barman and [employer]. During the encounter,   Hayden Pedro was located at [facility] Radiation Oncology Department.  Elba Barman was located at home with her husband Manina Scurry.      Carola Rhine, [facility]    **Disclaimer: This note was dictated with voice recognition software. Similar sounding words can inadvertently be transcribed and this note may contain transcription errors which may not have been corrected upon publication of note.**

## 2021-06-19 ENCOUNTER — Ambulatory Visit: Payer: Medicare HMO | Admitting: Radiation Oncology

## 2021-06-23 ENCOUNTER — Ambulatory Visit: Payer: Medicare HMO | Attending: Oncology

## 2021-06-23 ENCOUNTER — Other Ambulatory Visit: Payer: Self-pay

## 2021-06-23 DIAGNOSIS — R600 Localized edema: Secondary | ICD-10-CM | POA: Diagnosis not present

## 2021-06-23 DIAGNOSIS — R293 Abnormal posture: Secondary | ICD-10-CM

## 2021-06-23 DIAGNOSIS — Z483 Aftercare following surgery for neoplasm: Secondary | ICD-10-CM | POA: Diagnosis not present

## 2021-06-23 NOTE — Therapy (Signed)
[place], [place], [address] Phone: [phone]   Fax:  [phone]  Physical Therapy Evaluation  Patient Details  Name: Kendra Thompson MRN: [facility]:[id number] Date of Birth: 1951/09/24 Referring Provider (PT): Worthy Flank   Encounter Date: 06/23/2021   PT End of Session - 06/23/21 1459     Visit Number 1    Number of Visits 8    Date for PT Re-Evaluation 07/21/21    PT Start Time 1406    PT Stop Time 1440    PT Time Calculation (min) 34 min    Activity Tolerance Patient tolerated treatment well    Behavior During Therapy [facility] for tasks assessed/performed             Past Medical History:  Diagnosis Date   Allergy 1971   Arthritis 2017   Asthma 1971   has not been present for several years   Breast cancer ([place]) 04/18/2021   Cataract 2020   Family history of breast cancer 04/30/2021   GERD (gastroesophageal reflux disease)    Headache    Migraines occasionally   Hypertension    Seasonal allergies    Wears glasses     Past Surgical History:  Procedure Laterality Date   BREAST LUMPECTOMY WITH RADIOACTIVE SEED LOCALIZATION Left 05/20/2021   Procedure: LEFT BREAST BRACKETED LUMPECTOMY WITH RADIOACTIVE SEED LOCALIZATION x3;  Surgeon: Stark Klein, MD;  Location: [place];  Service: General;  Laterality: Left;   CLOSED REDUCTION FINGER WITH PERCUTANEOUS PINNING Left 07/25/2013   Procedure: CLOSED REDUCTION FINGER WITH PERCUTANEOUS PINNING LEFT SMALL METACARPAL;  Surgeon: Tennis Must, MD;  Location: [place];  Service: Orthopedics;  Laterality: Left;   COLONOSCOPY  2013,2014   DIAGNOSTIC LAPAROSCOPY  1992   explor-   DILATION AND CURETTAGE OF UTERUS     FRACTURE SURGERY  2014   HAND SURGERY  02/2020    There were no vitals filed for this visit.    Subjective Assessment - 06/23/21 1457     Subjective Pt just noticed swelling in left breast about 10 days ago, She had  some mild swelling and then it got worse, They had been at the beach and she is very prone to changes in barometric pressure.  she feels barometric pressure contributed to her swelling which now seems improved. Skin has been a little darker then the surrounding area.  Breast is sore with areas of firmness.  They are holding radiation until she gets treatment.  She is wearing a sports bra and that does seem tohave helped.    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  After being at the beach with high barometric pressure noted increase in left breast swelling/firmness    Patient Stated Goals Decrease left breast swelling    Currently in Pain? No/denies    Pain Score 0-No pain                [facility] PT Assessment - 06/23/21 0001       Assessment   Medical Diagnosis Left breast swelling    Referring Provider (PT) Worthy Flank PA-C   Onset Date/Surgical Date 05/20/21    Hand Dominance Right      Restrictions   Weight Bearing Restrictions No      Balance Screen   Has the patient fallen in the past 6 months Yes    How  many times? 1 no injury   Has the patient had a decrease in activity level because of a fear of falling?  No    Is the patient reluctant to leave their home because of a fear of falling?  No      Home Environment   Living Environment Private residence    Living Arrangements Spouse/significant other    Available Help at Discharge Family    Type of [place]      Prior Function   Level of [place] Retired    Leisure walking      Cognition   Overall Cognitive Status Within Functional Limits for tasks assessed      Posture/Postural Control   Posture/Postural Control Postural limitations    Postural Limitations Rounded Shoulders;Forward head      AROM   Right Shoulder Extension 69 Degrees    Right Shoulder Flexion 155 Degrees    Right Shoulder  ABduction 173 Degrees    Right Shoulder External Rotation 110 Degrees    Left Shoulder Extension 70 Degrees    Left Shoulder Flexion 156 Degrees    Left Shoulder ABduction 176 Degrees    Left Shoulder External Rotation 105 Degrees               LYMPHEDEMA/ONCOLOGY QUESTIONNAIRE - 06/23/21 0001       Type   Cancer Type DCIS      Surgeries   Lumpectomy Date 05/20/21    Number Lymph Nodes Removed 0      Date Lymphedema/Swelling Started   Date 06/08/21      Treatment   Active Chemotherapy Treatment No    Past Chemotherapy Treatment No    Active Radiation Treatment No   pending   Past Radiation Treatment No    Current Hormone Treatment No   pending   Past Hormone Therapy No      What other symptoms do you have   Are you Having Heaviness or Tightness Yes    Are you having Pain Yes    Are you having pitting edema No    Is it Hard or Difficult finding clothes that fit No    Do you have infections No      Right Upper Extremity Lymphedema   15 cm Proximal to Olecranon Process 42.6 cm    15 cm Proximal to Ulnar Styloid Process 26.7 cm    Just Proximal to Ulnar Styloid Process 17.5 cm    At Base of 2nd Digit 7.15 cm      Left Upper Extremity Lymphedema   15 cm Proximal to Olecranon Process 39.3 cm    15 cm Proximal to Ulnar Styloid Process 25.8 cm    Just Proximal to Ulnar Styloid Process 16.7 cm    At Base of 2nd Digit 6.9 cm                     Objective measurements completed on examination: See above findings.               PT Education - 06/23/21 1458     Education Details Educated about compression bra and script given    Person(s) Educated Patient    Methods Explanation;Other (comment)   Script   Comprehension Verbalized understanding                 PT Long Term Goals - 06/23/21 1857       PT LONG TERM GOAL #1  Title Pt/Pts husband wll be independent in left breast MLD    Time 4    Period Weeks    Status New     Target Date 07/21/21      PT LONG TERM GOAL #2   Title pt will purchase compression bra to control left breast swelling    Time 4    Period Weeks    Status New    Target Date 07/21/21      PT LONG TERM GOAL #3   Title Pt will attend ABC class    Time 4    Period Weeks    Status New    Target Date 07/21/21      PT LONG TERM GOAL #4   Title pt will report decreased left breast heaviness by 50% or more    Time 4    Period Weeks    Status New    Target Date 07/21/21                    Plan - 06/23/21 1500     Clinical Impression Statement Pt had left breast lumpectomy on 05/20/2021.  She had a little swelling initially but after a week at the beach with high barometric pressure she noted significant increase in breast swelling and firmness.  She has been wearing a sports bra and does feel this is improving.  Today there is mild visible swelling with mild fibrosis proximal to the nipple, and mild fibrosis under the breast.  She was given a script for a compression bra.   Shoulder ROM is WNL.She will benefit from left breast MLD and we may teach her or her husband.    Comorbidities left breast lumpectomy DCIS, No LN removed but will have radiation    Stability/Clinical Decision Making Stable/Uncomplicated    Clinical Decision Making Low    Rehab Potential Excellent    PT Frequency 2x / week    PT Duration 4 weeks    PT Treatment/Interventions Therapeutic exercise;Manual techniques;Patient/family education;Manual lymph drainage;Scar mobilization    PT Next Visit Plan did she get compression bra, schedule ABC class, MLD and instruct pt or husband    Recommended Other Services compression bra;gave script    Consulted and Agree with Plan of Care Patient;Family member/caregiver             Patient will benefit from skilled therapeutic intervention in order to improve the following deficits and impairments:  Postural dysfunction, Increased edema, Decreased knowledge of  precautions, Pain  Visit Diagnosis: Abnormal posture  Localized edema  Aftercare following surgery for neoplasm     Problem List Patient Active Problem List   Diagnosis Date Noted   Genetic testing 05/08/2021   Family history of breast cancer 04/30/2021   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   Onychomycosis of toenail 03/27/2021   Postmenopausal 12/29/2019   Gastroesophageal reflux disease without esophagitis 12/29/2019   Degenerative arthritis of thumb 10/01/2017   Chronic pain of both knees 09/11/2016   Obesity 08/03/2015   History of colonic polyps 06/20/2012   History of colon polyps 06/20/2012   Encounter for routine gynecological examination 05/24/2012   Well adult exam 08/12/2011   ASTHMA, INTERMITTENT, MILD 08/01/2010   HYPERGLYCEMIA, BORDERLINE 06/14/2008   DE QUERVAIN'S TENOSYNOVITIS, LEFT WRIST 07/18/2007   Essential hypertension 06/16/2007    Elsie Ra Josyah Achor 06/23/2021, 7:00 PM  [place], [place], [address] Phone: [phone]  Fax:  [phone]  Name: REESHA BERKEY MRN: [facility]:[id number] Date of Birth: 05/14/1951 Cheral Almas, PT 06/23/21 7:06 PM

## 2021-06-23 NOTE — Patient Instructions (Signed)
Pt given script to get compression bra at [facility] [employer]

## 2021-06-25 ENCOUNTER — Encounter: Payer: Self-pay | Admitting: *Deleted

## 2021-06-25 DIAGNOSIS — C50912 Malignant neoplasm of unspecified site of left female breast: Secondary | ICD-10-CM | POA: Diagnosis not present

## 2021-06-26 ENCOUNTER — Other Ambulatory Visit: Payer: Self-pay

## 2021-06-26 ENCOUNTER — Ambulatory Visit: Payer: Medicare HMO | Attending: Radiation Oncology

## 2021-06-26 DIAGNOSIS — R600 Localized edema: Secondary | ICD-10-CM | POA: Diagnosis not present

## 2021-06-26 DIAGNOSIS — Z483 Aftercare following surgery for neoplasm: Secondary | ICD-10-CM | POA: Insufficient documentation

## 2021-06-26 DIAGNOSIS — R293 Abnormal posture: Secondary | ICD-10-CM | POA: Insufficient documentation

## 2021-06-26 NOTE — Patient Instructions (Signed)
Pt was given written and illustrated instructions for left breast MLD

## 2021-06-26 NOTE — Therapy (Signed)
[place], [place], [address] Phone: [phone]   Fax:  [phone]  Physical Therapy Treatment  Patient Details  Name: Kendra Thompson MRN: [facility]:[id number] Date of Birth: December 22, 1950 Referring Provider (PT): Worthy Flank PA-C   Encounter Date: 06/26/2021   PT End of Session - 06/26/21 0912     Visit Number 2    Number of Visits 8    Date for PT Re-Evaluation 07/21/21    PT Start Time 0800    PT Stop Time [id number]    PT Time Calculation (min) 57 min    Activity Tolerance Patient tolerated treatment well    Behavior During Therapy [facility] for tasks assessed/performed             Past Medical History:  Diagnosis Date   Allergy 1971   Arthritis 2017   Asthma 1971   has not been present for several years   Breast cancer ([place]) 04/18/2021   Cataract 2020   Family history of breast cancer 04/30/2021   GERD (gastroesophageal reflux disease)    Headache    Migraines occasionally   Hypertension    Seasonal allergies    Wears glasses     Past Surgical History:  Procedure Laterality Date   BREAST LUMPECTOMY WITH RADIOACTIVE SEED LOCALIZATION Left 05/20/2021   Procedure: LEFT BREAST BRACKETED LUMPECTOMY WITH RADIOACTIVE SEED LOCALIZATION x3;  Surgeon: Stark Klein, MD;  Location: [place];  Service: General;  Laterality: Left;   CLOSED REDUCTION FINGER WITH PERCUTANEOUS PINNING Left 07/25/2013   Procedure: CLOSED REDUCTION FINGER WITH PERCUTANEOUS PINNING LEFT SMALL METACARPAL;  Surgeon: Tennis Must, MD;  Location: [place];  Service: Orthopedics;  Laterality: Left;   COLONOSCOPY  2013,2014   DIAGNOSTIC LAPAROSCOPY  1992   explor-   DILATION AND CURETTAGE OF UTERUS     FRACTURE SURGERY  2014   HAND SURGERY  02/2020    There were no vitals filed for this visit.   Subjective Assessment - 06/26/21 0757     Subjective Got the compression bra yesterday and it feels so good. The  swelling feels so much better and the tenderness is much better too.  There is one small area of firmness under the breast and nipple    Pertinent History Left lumpectomy 05/20/2021 with no LN removed.  After being at the beach with high barometric pressure noted increase in left breast swelling/firmness    Patient Stated Goals Decrease left breast swelling    Currently in Pain? No/denies    Pain Score 1                                [facility] Adult PT Treatment/Exercise - 06/26/21 0001       Manual Therapy   Edema Management small piece of peach foam cut and placed in stockinette to place in area of fibrosis under left breast and lower nipple    Soft tissue mobilization Scar massage and education to pt and husband to same    Manual Lymphatic Drainage (MLD) Pt and her husband were educated in MLD techniques to left breast;supraclavicular, 5 diaphragmatic breaths, bilateral axillary LNs, left inguinal LN's, Anterior interaxillary pathway, left axillo-inguinal pathway,and left breast medial to upper pathway and lateral breast to axillo-inguinal pathway, retracing pathways and ending with LN's  PT Education - 06/26/21 0912     Education Details Pt and her husband were educated in left breast MLD and scar massage    Person(s) Educated Patient;Spouse    Methods Explanation;Demonstration;Tactile cues;Verbal cues;Handout    Comprehension Tactile cues required;Verbal cues required;Need further instruction                 PT Long Term Goals - 06/23/21 1857       PT LONG TERM GOAL #1   Title Pt/Pts husband wll be independent in left breast MLD    Time 4    Period Weeks    Status New    Target Date 07/21/21      PT LONG TERM GOAL #2   Title pt will purchase compression bra to control left breast swelling    Time 4    Period Weeks    Status achieved   Target Date 07/21/21      PT LONG TERM GOAL #3   Title Pt will attend ABC class     Time 4    Period Weeks    Status New    Target Date 07/21/21      PT LONG TERM GOAL #4   Title pt will report decreased left breast heaviness by 50% or more    Time 4    Period Weeks    Status New    Target Date 07/21/21                   Plan - 06/26/21 0917     Clinical Impression Statement Pt got her compression bra yesterday and feels so much better. Heaviness and soreness is now gone.  There is mild fibrosis under the left breast for which some peach foam was made to place in her bra.  Pt. and her husband were instructed in MLD to the left breast. They did very well over all, but did require VC's and TC's for pressure and technique. Area of fibrosis was nearly resolved after MLD. They were also educated in gentle scar massage to incision area.    Comorbidities left breast lumpectomy DCIS, No LN removed but will have radiation    Stability/Clinical Decision Making Stable/Uncomplicated    Rehab Potential Excellent    PT Frequency 2x / week    PT Duration 4 weeks    PT Treatment/Interventions Therapeutic exercise;Manual techniques;Patient/family education;Manual lymph drainage;Scar mobilization    PT Next Visit Plan schedule ABC, Review breast MLD, scar massage    PT Home Exercise Plan Self MLD, Scar massage    Consulted and Agree with Plan of Care Patient;Family member/caregiver    Family Member Consulted husband             Patient will benefit from skilled therapeutic intervention in order to improve the following deficits and impairments:  Postural dysfunction, Increased edema, Decreased knowledge of precautions, Pain  Visit Diagnosis: Localized edema  Abnormal posture  Aftercare following surgery for neoplasm     Problem List Patient Active Problem List   Diagnosis Date Noted   Genetic testing 05/08/2021   Family history of breast cancer 04/30/2021   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   Onychomycosis of toenail 03/27/2021    Postmenopausal 12/29/2019   Gastroesophageal reflux disease without esophagitis 12/29/2019   Degenerative arthritis of thumb 10/01/2017   Chronic pain of both knees 09/11/2016   Obesity 08/03/2015   History of colonic polyps 06/20/2012   History of colon polyps 06/20/2012  Encounter for routine gynecological examination 05/24/2012   Well adult exam 08/12/2011   ASTHMA, INTERMITTENT, MILD 08/01/2010   HYPERGLYCEMIA, BORDERLINE 06/14/2008   DE QUERVAIN'S TENOSYNOVITIS, LEFT WRIST 07/18/2007   Essential hypertension 06/16/2007    Elsie Ra Wanette Robison 06/26/2021, 9:23 AM  [place], [place], [address] Phone: [phone]   Fax:  [phone]  Name: ELOINA SAWINSKI MRN: [id number] Date of Birth: 02-Sep-1951  Cheral Almas, PT 06/26/21 9:24 AM

## 2021-07-01 ENCOUNTER — Ambulatory Visit
Admission: RE | Admit: 2021-07-01 | Discharge: 2021-07-01 | Disposition: A | Discharge status: Home / Self Care | Payer: Medicare HMO | Source: Ambulatory Visit | Attending: Radiation Oncology | Admitting: Radiation Oncology

## 2021-07-01 ENCOUNTER — Other Ambulatory Visit: Payer: Self-pay

## 2021-07-01 DIAGNOSIS — Z510 Encounter for antineoplastic radiation therapy: Secondary | ICD-10-CM | POA: Diagnosis not present

## 2021-07-01 DIAGNOSIS — D0512 Intraductal carcinoma in situ of left breast: Secondary | ICD-10-CM | POA: Diagnosis present

## 2021-07-03 ENCOUNTER — Ambulatory Visit: Payer: Medicare HMO

## 2021-07-07 ENCOUNTER — Encounter: Payer: Self-pay | Admitting: *Deleted

## 2021-07-08 ENCOUNTER — Ambulatory Visit
Admission: RE | Admit: 2021-07-08 | Discharge: 2021-07-08 | Disposition: A | Discharge status: Home / Self Care | Payer: Medicare HMO | Source: Ambulatory Visit | Attending: Radiation Oncology | Admitting: Radiation Oncology

## 2021-07-08 ENCOUNTER — Other Ambulatory Visit: Payer: Self-pay

## 2021-07-08 ENCOUNTER — Ambulatory Visit: Payer: Medicare HMO

## 2021-07-08 DIAGNOSIS — D0512 Intraductal carcinoma in situ of left breast: Secondary | ICD-10-CM | POA: Diagnosis not present

## 2021-07-08 DIAGNOSIS — Z483 Aftercare following surgery for neoplasm: Secondary | ICD-10-CM | POA: Diagnosis not present

## 2021-07-08 DIAGNOSIS — R293 Abnormal posture: Secondary | ICD-10-CM

## 2021-07-08 DIAGNOSIS — Z510 Encounter for antineoplastic radiation therapy: Secondary | ICD-10-CM | POA: Diagnosis not present

## 2021-07-08 DIAGNOSIS — R600 Localized edema: Secondary | ICD-10-CM | POA: Diagnosis not present

## 2021-07-08 NOTE — Patient Instructions (Signed)
Pt instructed in scar massage and given handout

## 2021-07-08 NOTE — Therapy (Signed)
[place], [place], [address] Phone: [phone]   Fax:  [phone]  Physical Therapy Treatment  Patient Details  Name: Kendra Thompson MRN: [id number] Date of Birth: 07-30-1951 Referring Provider (PT): Worthy Flank PA-C   Encounter Date: 07/08/2021   PT End of Session - 07/08/21 1356     Visit Number 3    Number of Visits 8    Date for PT Re-Evaluation 07/21/21    PT Start Time [id number]    PT Stop Time 1350    PT Time Calculation (min) 47 min    Activity Tolerance Patient tolerated treatment well    Behavior During Therapy [facility] for tasks assessed/performed             Past Medical History:  Diagnosis Date   Allergy 1971   Arthritis 2017   Asthma 1971   has not been present for several years   Breast cancer ([place]) 04/18/2021   Cataract 2020   Family history of breast cancer 04/30/2021   GERD (gastroesophageal reflux disease)    Headache    Migraines occasionally   Hypertension    Seasonal allergies    Wears glasses     Past Surgical History:  Procedure Laterality Date   BREAST LUMPECTOMY WITH RADIOACTIVE SEED LOCALIZATION Left 05/20/2021   Procedure: LEFT BREAST BRACKETED LUMPECTOMY WITH RADIOACTIVE SEED LOCALIZATION x3;  Surgeon: Stark Klein, MD;  Location: [place];  Service: General;  Laterality: Left;   CLOSED REDUCTION FINGER WITH PERCUTANEOUS PINNING Left 07/25/2013   Procedure: CLOSED REDUCTION FINGER WITH PERCUTANEOUS PINNING LEFT SMALL METACARPAL;  Surgeon: Tennis Must, MD;  Location: [place];  Service: Orthopedics;  Laterality: Left;   COLONOSCOPY  2013,2014   DIAGNOSTIC LAPAROSCOPY  1992   explor-   DILATION AND CURETTAGE OF UTERUS     FRACTURE SURGERY  2014   HAND SURGERY  02/2020    There were no vitals filed for this visit.   Subjective Assessment - 07/08/21 1304     Subjective Still wearing the compression bra all day but it is getting very  uncomfortable to wear at night.Swelling is almost non-existent.  This morning I couldn't feel any of the firmness.  I fell the other day.  I  I got a bruise on my right breast but that is better too. i have been doing the MLD atleast 1x per day, and sometimes at night. I have been wearing the pad that was made too . I start radiation today    Pertinent History Left lumpectomy 05/20/2021 with no LN removed.  After being at the beach with high barometric pressure noted increase in left breast swelling/firmness    Patient Stated Goals Decrease left breast swelling    Currently in Pain? No/denies    Pain Score 0-No pain   its sore to touch but not painful   Multiple Pain Sites No                               [facility] Adult PT Treatment/Exercise - 07/08/21 0001       Manual Therapy   Manual therapy comments pt advised to check ROM throughout radiation and to continue MLD if breast is not too tender as she gets further alaong with radiation    Soft tissue mobilization scar massage by PT and instructed to pt.    Manual Lymphatic Drainage (MLD) Pt and her  husband reviewed MLD techniques to left breast;supraclavicular, 5 diaphragmatic breaths, bilateral axillary LNs, left inguinal LN's, Anterior interaxillary pathway, left axillo-inguinal pathway,and left breast medial to upper pathway and lateral breast to axillo-inguinal pathway, retracing pathways and ending with LN's                     PT Education - 07/08/21 1355     Education Details scar massage    Person(s) Educated Patient    Methods Explanation;Demonstration;Handout    Comprehension Verbalized understanding                 PT Long Term Goals - 06/23/21 1857       PT LONG TERM GOAL #1   Title Pt/Pts husband wll be independent in left breast MLD    Time 4    Period Weeks    Status New    Target Date 07/21/21      PT LONG TERM GOAL #2   Title pt will purchase compression bra to control left  breast swelling    Time 4    Period Weeks    Status achieved   Target Date 07/21/21      PT LONG TERM GOAL #3   Title Pt will attend ABC class    Time 4    Period Weeks    Status New    Target Date 07/21/21      PT LONG TERM GOAL #4   Title pt will report decreased left breast heaviness by 50% or more    Time 4    Period Weeks    Status New    Target Date 07/21/21                   Plan - 07/08/21 1356     Clinical Impression Statement Pt has been compliant with Manual Lymph drainage and use of her compression bra.  She has been uncomfortable wearing her bra all day so will try sleeping without it tonight.  She did well for the most part with MLD but did require VC's and TC's for proper stretch and sequence and to lighten her touch.  She seems to have a good understanding and did well making the recommended adjustments to improve technique.  She starts radiation today and was advised to continue MLD and compression bra for as long as possible to prevent swelling of breast and to keep watch on shoulder ROM to be sure she doesn't get restricted.    Comorbidities left breast lumpectomy DCIS, No LN removed but will have radiation    Stability/Clinical Decision Making Stable/Uncomplicated    Rehab Potential Excellent    PT Frequency 2x / week    PT Duration 4 weeks    PT Treatment/Interventions Therapeutic exercise;Manual techniques;Patient/family education;Manual lymph drainage;Scar mobilization    PT Next Visit Plan schedule ABC, Review breast MLD, scar massage, supine scapular series    PT Home Exercise Plan Self MLD, Scar massage    Consulted and Agree with Plan of Care Patient             Patient will benefit from skilled therapeutic intervention in order to improve the following deficits and impairments:  Postural dysfunction, Increased edema, Decreased knowledge of precautions, Pain  Visit Diagnosis: Localized edema  Abnormal posture  Aftercare following  surgery for neoplasm     Problem List Patient Active Problem List   Diagnosis Date Noted   Genetic testing 05/08/2021   Family history of breast  cancer 04/30/2021   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   Onychomycosis of toenail 03/27/2021   Postmenopausal 12/29/2019   Gastroesophageal reflux disease without esophagitis 12/29/2019   Degenerative arthritis of thumb 10/01/2017   Chronic pain of both knees 09/11/2016   Obesity 08/03/2015   History of colonic polyps 06/20/2012   History of colon polyps 06/20/2012   Encounter for routine gynecological examination 05/24/2012   Well adult exam 08/12/2011   ASTHMA, INTERMITTENT, MILD 08/01/2010   HYPERGLYCEMIA, BORDERLINE 06/14/2008   DE QUERVAIN'S TENOSYNOVITIS, LEFT WRIST 07/18/2007   Essential hypertension 06/16/2007    Claris Pong, PT 07/08/2021, 2:00 PM  [place], [place], [address] Phone: [phone]   Fax:  [phone]  Name: JAMIRACLE HAUSNER MRN: [facility]:[id number] Date of Birth: 1950/12/13

## 2021-07-09 ENCOUNTER — Ambulatory Visit
Admission: RE | Admit: 2021-07-09 | Discharge: 2021-07-09 | Disposition: A | Discharge status: Home / Self Care | Payer: Medicare HMO | Source: Ambulatory Visit | Attending: Radiation Oncology | Admitting: Radiation Oncology

## 2021-07-09 DIAGNOSIS — Z510 Encounter for antineoplastic radiation therapy: Secondary | ICD-10-CM | POA: Diagnosis not present

## 2021-07-09 DIAGNOSIS — D0512 Intraductal carcinoma in situ of left breast: Secondary | ICD-10-CM | POA: Diagnosis not present

## 2021-07-10 ENCOUNTER — Ambulatory Visit: Payer: Medicare HMO

## 2021-07-10 ENCOUNTER — Other Ambulatory Visit: Payer: Self-pay

## 2021-07-10 ENCOUNTER — Ambulatory Visit
Admission: RE | Admit: 2021-07-10 | Discharge: 2021-07-10 | Disposition: A | Discharge status: Home / Self Care | Payer: Medicare HMO | Source: Ambulatory Visit | Attending: Radiation Oncology | Admitting: Radiation Oncology

## 2021-07-10 DIAGNOSIS — D0512 Intraductal carcinoma in situ of left breast: Secondary | ICD-10-CM | POA: Diagnosis not present

## 2021-07-10 DIAGNOSIS — Z483 Aftercare following surgery for neoplasm: Secondary | ICD-10-CM

## 2021-07-10 DIAGNOSIS — R293 Abnormal posture: Secondary | ICD-10-CM

## 2021-07-10 DIAGNOSIS — R600 Localized edema: Secondary | ICD-10-CM | POA: Diagnosis not present

## 2021-07-10 DIAGNOSIS — Z510 Encounter for antineoplastic radiation therapy: Secondary | ICD-10-CM | POA: Diagnosis not present

## 2021-07-10 NOTE — Progress Notes (Signed)

## 2021-07-10 NOTE — Therapy (Signed)
[place], [place], [address] Phone: [phone]   Fax:  [phone]  Physical Therapy Treatment  Patient Details  Name: Kendra Thompson MRN: [id number] Date of Birth: 1951-06-10 Referring Provider (PT): Worthy Flank PA-C   Encounter Date: 07/10/2021   PT End of Session - 07/10/21 1313     Visit Number 4    Number of Visits 8    Date for PT Re-Evaluation 07/21/21    PT Start Time [id number]    PT Stop Time 1350    PT Time Calculation (min) 47 min    Activity Tolerance Patient tolerated treatment well    Behavior During Therapy [facility] for tasks assessed/performed             Past Medical History:  Diagnosis Date   Allergy 1971   Arthritis 2017   Asthma 1971   has not been present for several years   Breast cancer ([place]) 04/18/2021   Cataract 2020   Family history of breast cancer 04/30/2021   GERD (gastroesophageal reflux disease)    Headache    Migraines occasionally   Hypertension    Seasonal allergies    Wears glasses     Past Surgical History:  Procedure Laterality Date   BREAST LUMPECTOMY WITH RADIOACTIVE SEED LOCALIZATION Left 05/20/2021   Procedure: LEFT BREAST BRACKETED LUMPECTOMY WITH RADIOACTIVE SEED LOCALIZATION x3;  Surgeon: Stark Klein, MD;  Location: [place];  Service: General;  Laterality: Left;   CLOSED REDUCTION FINGER WITH PERCUTANEOUS PINNING Left 07/25/2013   Procedure: CLOSED REDUCTION FINGER WITH PERCUTANEOUS PINNING LEFT SMALL METACARPAL;  Surgeon: Tennis Must, MD;  Location: [place];  Service: Orthopedics;  Laterality: Left;   COLONOSCOPY  2013,2014   DIAGNOSTIC LAPAROSCOPY  1992   explor-   DILATION AND CURETTAGE OF UTERUS     FRACTURE SURGERY  2014   HAND SURGERY  02/2020    There were no vitals filed for this visit.   Subjective Assessment - 07/10/21 1300     Subjective I don't know if we can do any drainage today because my breast is  really sore. Did OK after Tuesday, I did the drainage yesterday and felt good. This morning there was a little firm area under my breast and I tried to do the drainage but it was realy sore. Have been going without bra at night but it doesn't seem any more swollen.  I have really lightened my touch up alot.    Pertinent History Left lumpectomy 05/20/2021 with no LN removed.  After being at the beach with high barometric pressure noted increase in left breast swelling/firmness    Patient Stated Goals Decrease left breast swelling    Currently in Pain? No/denies    Pain Score 0-No pain                               [facility] Adult PT Treatment/Exercise - 07/10/21 0001       Shoulder Exercises: Supine   Horizontal ABduction Strengthening;Both;10 reps    Theraband Level (Shoulder Horizontal ABduction) Level 1 (Yellow)    External Rotation Strengthening;Both;10 reps    Theraband Level (Shoulder External Rotation) Level 1 (Yellow)    Flexion Strengthening;Both;10 reps    Theraband Level (Shoulder Flexion) Level 1 (Yellow)    Diagonals Strengthening;Right;Left;10 reps    Theraband Level (Shoulder Diagonals) Level 1 (Yellow)      Manual Therapy  Soft tissue mobilization held scar massage secondary to nipple soreness    Manual Lymphatic Drainage (MLD) MLD to left breast;supraclavicular, 5 diaphragmatic breaths, bilateral axillary LNs, left inguinal LN's, Anterior interaxillary pathway, left axillo-inguinal pathway,and left breast medial to upper pathway and lateral breast to axillo-inguinal pathway, retracing pathways and ending with LN's                     PT Education - 07/10/21 1354     Education Details pt was educated in supine scapular series    Person(s) Educated Patient    Methods Explanation;Demonstration;Handout    Comprehension Returned demonstration                 PT Long Term Goals - 06/23/21 1857       PT LONG TERM GOAL #1   Title  Pt/Pts husband wll be independent in left breast MLD    Time 4    Period Weeks    Status New    Target Date 07/21/21      PT LONG TERM GOAL #2   Title pt will purchase compression bra to control left breast swelling    Time 4    Period Weeks    Status New    Target Date 07/21/21      PT LONG TERM GOAL #3   Title Pt will attend ABC class    Time 4    Period Weeks    Status New    Target Date 07/21/21      PT LONG TERM GOAL #4   Title pt will report decreased left breast heaviness by 50% or more    Time 4    Period Weeks    Status New    Target Date 07/21/21                   Plan - 07/10/21 1340     Clinical Impression Statement Pts left nipple was very sore when first arriving possibly from her doing scar massage too forcefully. Therapist performed Manual lymph drainage but avoided nipple area and pt had no increased sensitivity.  Pt reported feeling a little less tender after MLD.  Pt was educated in supine scapular series x 10 and did very well.  She was advised to start with 10 reps in supine and was given yellow bands.  To do 3-4 times/week    Comorbidities left breast lumpectomy DCIS, No LN removed but will have radiation    Stability/Clinical Decision Making Stable/Uncomplicated    PT Frequency 2x / week    PT Duration 4 weeks    PT Treatment/Interventions Therapeutic exercise;Manual techniques;Patient/family education;Manual lymph drainage;Scar mobilization    PT Next Visit Plan Review breast MLD, scar massage, review supine scapular series    PT Home Exercise Plan Self MLD, Scar massage    Consulted and Agree with Plan of Care Patient             Patient will benefit from skilled therapeutic intervention in order to improve the following deficits and impairments:  Postural dysfunction, Increased edema, Decreased knowledge of precautions, Pain  Visit Diagnosis: Localized edema  Abnormal posture  Aftercare following surgery for  neoplasm     Problem List Patient Active Problem List   Diagnosis Date Noted   Genetic testing 05/08/2021   Family history of breast cancer 04/30/2021   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   Onychomycosis of toenail 03/27/2021   Postmenopausal 12/29/2019  Gastroesophageal reflux disease without esophagitis 12/29/2019   Degenerative arthritis of thumb 10/01/2017   Chronic pain of both knees 09/11/2016   Obesity 08/03/2015   History of colonic polyps 06/20/2012   History of colon polyps 06/20/2012   Encounter for routine gynecological examination 05/24/2012   Well adult exam 08/12/2011   ASTHMA, INTERMITTENT, MILD 08/01/2010   HYPERGLYCEMIA, BORDERLINE 06/14/2008   DE QUERVAIN'S TENOSYNOVITIS, LEFT WRIST 07/18/2007   Essential hypertension 06/16/2007    Claris Pong, PT 07/10/2021, 2:00 PM  [place], [place], [address] Phone: [phone]   Fax:  [phone]  Name: TATYIANA EATON MRN: [id number]:[id number] Date of Birth: 1951-09-25

## 2021-07-10 NOTE — Patient Instructions (Signed)

## 2021-07-11 ENCOUNTER — Ambulatory Visit
Admission: RE | Admit: 2021-07-11 | Discharge: 2021-07-11 | Disposition: A | Discharge status: Home / Self Care | Payer: Medicare HMO | Source: Ambulatory Visit | Attending: Radiation Oncology | Admitting: Radiation Oncology

## 2021-07-11 DIAGNOSIS — Z510 Encounter for antineoplastic radiation therapy: Secondary | ICD-10-CM | POA: Diagnosis not present

## 2021-07-11 DIAGNOSIS — D0512 Intraductal carcinoma in situ of left breast: Secondary | ICD-10-CM | POA: Diagnosis not present

## 2021-07-11 MED ORDER — RADIAPLEXRX EX GEL: Freq: Once | CUTANEOUS | Status: AC

## 2021-07-11 MED ORDER — ALRA NON-METALLIC DEODORANT (RAD-ONC)
1.0000 "application " | Freq: Once | TOPICAL | Status: AC
  Administered 2021-07-11: 1 via TOPICAL

## 2021-07-14 ENCOUNTER — Other Ambulatory Visit: Payer: Self-pay

## 2021-07-14 ENCOUNTER — Ambulatory Visit
Admission: RE | Admit: 2021-07-14 | Discharge: 2021-07-14 | Disposition: A | Discharge status: Home / Self Care | Payer: Medicare HMO | Source: Ambulatory Visit | Attending: Radiation Oncology | Admitting: Radiation Oncology

## 2021-07-14 DIAGNOSIS — Z510 Encounter for antineoplastic radiation therapy: Secondary | ICD-10-CM | POA: Diagnosis not present

## 2021-07-14 DIAGNOSIS — D0512 Intraductal carcinoma in situ of left breast: Secondary | ICD-10-CM | POA: Diagnosis not present

## 2021-07-15 ENCOUNTER — Ambulatory Visit
Admission: RE | Admit: 2021-07-15 | Discharge: 2021-07-15 | Disposition: A | Discharge status: Home / Self Care | Payer: Medicare HMO | Source: Ambulatory Visit | Attending: Radiation Oncology | Admitting: Radiation Oncology

## 2021-07-15 DIAGNOSIS — D0512 Intraductal carcinoma in situ of left breast: Secondary | ICD-10-CM | POA: Diagnosis not present

## 2021-07-15 DIAGNOSIS — Z510 Encounter for antineoplastic radiation therapy: Secondary | ICD-10-CM | POA: Diagnosis not present

## 2021-07-16 ENCOUNTER — Ambulatory Visit
Admission: RE | Admit: 2021-07-16 | Discharge: 2021-07-16 | Disposition: A | Discharge status: Home / Self Care | Payer: Medicare HMO | Source: Ambulatory Visit | Attending: Radiation Oncology | Admitting: Radiation Oncology

## 2021-07-16 ENCOUNTER — Ambulatory Visit: Payer: Medicare HMO

## 2021-07-16 ENCOUNTER — Other Ambulatory Visit: Payer: Self-pay

## 2021-07-16 DIAGNOSIS — D0512 Intraductal carcinoma in situ of left breast: Secondary | ICD-10-CM | POA: Diagnosis not present

## 2021-07-16 DIAGNOSIS — R293 Abnormal posture: Secondary | ICD-10-CM

## 2021-07-16 DIAGNOSIS — Z483 Aftercare following surgery for neoplasm: Secondary | ICD-10-CM | POA: Diagnosis not present

## 2021-07-16 DIAGNOSIS — R600 Localized edema: Secondary | ICD-10-CM

## 2021-07-16 DIAGNOSIS — Z510 Encounter for antineoplastic radiation therapy: Secondary | ICD-10-CM | POA: Diagnosis not present

## 2021-07-16 NOTE — Therapy (Signed)
[place], [place], [address] Phone: [phone]   Fax:  [phone]  Physical Therapy Treatment  Patient Details  Name: Kendra Thompson MRN: [id number] Date of Birth: Sep 18, 1951 Referring Provider (PT): Worthy Flank PA-C   Encounter Date: 07/16/2021   PT End of Session - 07/16/21 1230     Visit Number 5    Number of Visits 8    Date for PT Re-Evaluation 07/21/21    PT Start Time 1107    PT Stop Time 1204    PT Time Calculation (min) 57 min    Activity Tolerance Patient tolerated treatment well    Behavior During Therapy [facility] for tasks assessed/performed             Past Medical History:  Diagnosis Date   Allergy 1971   Arthritis 2017   Asthma 1971   has not been present for several years   Breast cancer ([place]) 04/18/2021   Cataract 2020   Family history of breast cancer 04/30/2021   GERD (gastroesophageal reflux disease)    Headache    Migraines occasionally   Hypertension    Seasonal allergies    Wears glasses     Past Surgical History:  Procedure Laterality Date   BREAST LUMPECTOMY WITH RADIOACTIVE SEED LOCALIZATION Left 05/20/2021   Procedure: LEFT BREAST BRACKETED LUMPECTOMY WITH RADIOACTIVE SEED LOCALIZATION x3;  Surgeon: Stark Klein, MD;  Location: [place];  Service: General;  Laterality: Left;   CLOSED REDUCTION FINGER WITH PERCUTANEOUS PINNING Left 07/25/2013   Procedure: CLOSED REDUCTION FINGER WITH PERCUTANEOUS PINNING LEFT SMALL METACARPAL;  Surgeon: Tennis Must, MD;  Location: [place];  Service: Orthopedics;  Laterality: Left;   COLONOSCOPY  2013,2014   DIAGNOSTIC LAPAROSCOPY  1992   explor-   DILATION AND CURETTAGE OF UTERUS     FRACTURE SURGERY  2014   HAND SURGERY  02/2020    There were no vitals filed for this visit.   Subjective Assessment - 07/16/21 1108     Subjective My nipple isn't as sore todya as it was last time. I can tell the  swelling is getting worse though from radiation.    Pertinent History Left lumpectomy 05/20/2021 with no LN removed.  After being at the beach with high barometric pressure noted increase in left breast swelling/firmness    Patient Stated Goals Decrease left breast swelling    Currently in Pain? No/denies                               [facility] Adult PT Treatment/Exercise - 07/16/21 0001       Manual Therapy   Manual Lymphatic Drainage (MLD) MLD to left breast in supine: supraclavicular, 5 diaphragmatic breaths, Rt axillary LNs, left inguinal LN's, Anterior interaxillary pathway, left axillo-inguinal pathway,and left breast medial to upper pathway and lateral breast to axillo-inguinal pathway, then into Rt S/L for further work along lateral aspect of breast redirecting fluid towards posterior inter-axillary and Lt axillo-inguinal anastomosis, then finished in supine retracing pathways and ending with LN's                          PT Long Term Goals - 06/23/21 1857       PT LONG TERM GOAL #1   Title Pt/Pts husband wll be independent in left breast MLD    Time 4  Period Weeks    Status New    Target Date 07/21/21      PT LONG TERM GOAL #2   Title pt will purchase compression bra to control left breast swelling    Time 4    Period Weeks    Status New    Target Date 07/21/21      PT LONG TERM GOAL #3   Title Pt will attend ABC class    Time 4    Period Weeks    Status New    Target Date 07/21/21      PT LONG TERM GOAL #4   Title pt will report decreased left breast heaviness by 50% or more    Time 4    Period Weeks    Status New    Target Date 07/21/21                   Plan - 07/16/21 1230     Clinical Impression Statement Pts nipple was mch improved today but reports feeling more swelling than last week due to radiation. Continued with manual lymph drainage of Lt breast, working in supine and Rt S/L, to treat all aspects of  breast tissue and softness was noted by end of session. Pt reports has been doing supine scapular series issued at last session every other day and reports no problems with those and in fact, feels they have further helped her shoulder and chest feel less tight. Encouraged her to cont wearing her compression bra daily and compression foam as much as able as she reports not wearing this as often.    Comorbidities left breast lumpectomy DCIS, No LN removed but will have radiation    Stability/Clinical Decision Making Stable/Uncomplicated    Rehab Potential Excellent    PT Frequency 2x / week    PT Duration 4 weeks    PT Treatment/Interventions Therapeutic exercise;Manual techniques;Patient/family education;Manual lymph drainage;Scar mobilization    PT Next Visit Plan Review  and cont breast MLD, scar massage    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   Onychomycosis of toenail 03/27/2021   Postmenopausal 12/29/2019   Gastroesophageal reflux disease without esophagitis 12/29/2019   Degenerative arthritis of thumb 10/01/2017   Chronic pain of both knees 09/11/2016   Obesity 08/03/2015   History of colonic polyps 06/20/2012   History of colon polyps 06/20/2012   Encounter for routine gynecological examination 05/24/2012   Well adult exam 08/12/2011   ASTHMA, INTERMITTENT, MILD 08/01/2010    HYPERGLYCEMIA, BORDERLINE 06/14/2008   DE QUERVAIN'S TENOSYNOVITIS, LEFT WRIST 07/18/2007   Essential hypertension 06/16/2007    Otelia Limes, PTA 07/16/2021, 12:33 PM  [place], [place], [address] Phone: [phone]   Fax:  [phone]  Name: Kendra Thompson MRN: [id number] Date of Birth: 05-May-1951

## 2021-07-17 ENCOUNTER — Ambulatory Visit
Admission: RE | Admit: 2021-07-17 | Discharge: 2021-07-17 | Disposition: A | Discharge status: Home / Self Care | Payer: Medicare HMO | Source: Ambulatory Visit | Attending: Radiation Oncology | Admitting: Radiation Oncology

## 2021-07-17 DIAGNOSIS — D0512 Intraductal carcinoma in situ of left breast: Secondary | ICD-10-CM | POA: Diagnosis not present

## 2021-07-17 DIAGNOSIS — Z510 Encounter for antineoplastic radiation therapy: Secondary | ICD-10-CM | POA: Diagnosis not present

## 2021-07-18 ENCOUNTER — Other Ambulatory Visit: Payer: Self-pay

## 2021-07-18 ENCOUNTER — Ambulatory Visit
Admission: RE | Admit: 2021-07-18 | Discharge: 2021-07-18 | Disposition: A | Discharge status: Home / Self Care | Payer: Medicare HMO | Source: Ambulatory Visit | Attending: Radiation Oncology | Admitting: Radiation Oncology

## 2021-07-18 DIAGNOSIS — D0512 Intraductal carcinoma in situ of left breast: Secondary | ICD-10-CM | POA: Diagnosis not present

## 2021-07-18 DIAGNOSIS — Z510 Encounter for antineoplastic radiation therapy: Secondary | ICD-10-CM | POA: Diagnosis not present

## 2021-07-21 ENCOUNTER — Ambulatory Visit
Admission: RE | Admit: 2021-07-21 | Discharge: 2021-07-21 | Disposition: A | Discharge status: Home / Self Care | Payer: Medicare HMO | Source: Ambulatory Visit | Attending: Radiation Oncology | Admitting: Radiation Oncology

## 2021-07-21 ENCOUNTER — Other Ambulatory Visit: Payer: Self-pay

## 2021-07-21 DIAGNOSIS — D0512 Intraductal carcinoma in situ of left breast: Secondary | ICD-10-CM | POA: Diagnosis not present

## 2021-07-21 DIAGNOSIS — Z510 Encounter for antineoplastic radiation therapy: Secondary | ICD-10-CM | POA: Diagnosis not present

## 2021-07-22 ENCOUNTER — Encounter: Payer: Self-pay | Admitting: Rehabilitation

## 2021-07-22 ENCOUNTER — Ambulatory Visit: Payer: Medicare HMO | Admitting: Rehabilitation

## 2021-07-22 ENCOUNTER — Ambulatory Visit
Admission: RE | Admit: 2021-07-22 | Discharge: 2021-07-22 | Disposition: A | Discharge status: Home / Self Care | Payer: Medicare HMO | Source: Ambulatory Visit | Attending: Radiation Oncology | Admitting: Radiation Oncology

## 2021-07-22 DIAGNOSIS — R293 Abnormal posture: Secondary | ICD-10-CM | POA: Diagnosis not present

## 2021-07-22 DIAGNOSIS — Z483 Aftercare following surgery for neoplasm: Secondary | ICD-10-CM | POA: Diagnosis not present

## 2021-07-22 DIAGNOSIS — D0512 Intraductal carcinoma in situ of left breast: Secondary | ICD-10-CM | POA: Diagnosis not present

## 2021-07-22 DIAGNOSIS — R600 Localized edema: Secondary | ICD-10-CM | POA: Diagnosis not present

## 2021-07-22 DIAGNOSIS — Z510 Encounter for antineoplastic radiation therapy: Secondary | ICD-10-CM | POA: Diagnosis not present

## 2021-07-22 NOTE — Therapy (Signed)
[place], [place], [address] Phone: [phone]   Fax:  [phone]  Physical Therapy Treatment  Patient Details  Name: Kendra Thompson MRN: [id number] Date of Birth: December 01, 1950 Referring Provider (PT): Worthy Flank PA-C   Encounter Date: 07/22/2021   PT End of Session - 07/22/21 1441     Visit Number 6    Number of Visits 14    Date for PT Re-Evaluation 08/19/21    PT Start Time 1400    PT Stop Time 1440    PT Time Calculation (min) 40 min    Activity Tolerance Patient tolerated treatment well    Behavior During Therapy [facility] for tasks assessed/performed             Past Medical History:  Diagnosis Date   Allergy 1971   Arthritis 2017   Asthma 1971   has not been present for several years   Breast cancer ([place]) 04/18/2021   Cataract 2020   Family history of breast cancer 04/30/2021   GERD (gastroesophageal reflux disease)    Headache    Migraines occasionally   Hypertension    Seasonal allergies    Wears glasses     Past Surgical History:  Procedure Laterality Date   BREAST LUMPECTOMY WITH RADIOACTIVE SEED LOCALIZATION Left 05/20/2021   Procedure: LEFT BREAST BRACKETED LUMPECTOMY WITH RADIOACTIVE SEED LOCALIZATION x3;  Surgeon: Stark Klein, MD;  Location: [place];  Service: General;  Laterality: Left;   CLOSED REDUCTION FINGER WITH PERCUTANEOUS PINNING Left 07/25/2013   Procedure: CLOSED REDUCTION FINGER WITH PERCUTANEOUS PINNING LEFT SMALL METACARPAL;  Surgeon: Tennis Must, MD;  Location: [place];  Service: Orthopedics;  Laterality: Left;   COLONOSCOPY  2013,2014   DIAGNOSTIC LAPAROSCOPY  1992   explor-   DILATION AND CURETTAGE OF UTERUS     FRACTURE SURGERY  2014   HAND SURGERY  02/2020    There were no vitals filed for this visit.   Subjective Assessment - 07/22/21 1400     Subjective The skin redness is starting    Pertinent History Left  lumpectomy 05/20/2021 with no LN removed.  After being at the beach with high barometric pressure noted increase in left breast swelling/firmness    Currently in Pain? Yes    Pain Score 2     Pain Location Breast    Pain Orientation Left    Pain Descriptors / Indicators Tender;Stabbing    Pain Type Acute pain    Pain Onset More than a month ago    Pain Frequency Intermittent                               [facility] Adult PT Treatment/Exercise - 07/22/21 0001       Manual Therapy   Manual Lymphatic Drainage (MLD) MLD to left breast in supine: supraclavicular, 5 diaphragmatic breaths, bil axillary LNs, left inguinal LN's, Anterior interaxillary pathway, left axillo-inguinal pathway,and left breast medial to upper pathway and lateral breast to axillo-inguinal pathway, then into Rt S/L for further work along lateral aspect of breast redirecting fluid towards posterior inter-axillary and Lt axillo-inguinal anastomosis, then finished in supine retracing pathways and ending with LN's. Avoided deeper breast work and direct over breast today due to tenderness and increasing redness  PT Long Term Goals - 06/23/21 1857       PT LONG TERM GOAL #1   Title Pt/Pts husband wll be independent in left breast MLD    Time 4    Period Weeks    Status New    Target Date 07/21/21      PT LONG TERM GOAL #2   Title pt will purchase compression bra to control left breast swelling    Time 4    Period Weeks    Status New    Target Date 07/21/21      PT LONG TERM GOAL #3   Title Pt will attend ABC class    Time 4    Period Weeks    Status New    Target Date 07/21/21      PT LONG TERM GOAL #4   Title pt will report decreased left breast heaviness by 50% or more    Time 4    Period Weeks    Status New    Target Date 07/21/21                   Plan - 07/22/21 1442     Clinical Impression Statement shorter session of MLD today due  to breast discomfort and redness present.  Performed MLD stopping right at radiation field borders.  Focused on Lt axillary clearance and pathways.  Pt aware that treatment will be more limited during the end of radiation and will let us know when to break as she still likes to complete PT sessions. Extended POC today    PT Frequency 2x / week    PT Duration 4 weeks    PT Treatment/Interventions Therapeutic exercise;Manual techniques;Patient/family education;Manual lymph drainage;Scar mobilization    PT Next Visit Plan MLD for the left breast as tolerated - stopped at radiation field this session, PROM    Consulted and Agree with Plan of Care Patient             Patient will benefit from skilled therapeutic intervention in order to improve the following deficits and impairments:  Postural dysfunction, Increased edema, Decreased knowledge of precautions, Pain  Visit Diagnosis: Localized edema  Abnormal posture  Aftercare following surgery for neoplasm     Problem List Patient Active Problem List   Diagnosis Date Noted   Genetic testing 05/08/2021   Family history of breast cancer 04/30/2021   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   Onychomycosis of toenail 03/27/2021   Postmenopausal 12/29/2019   Gastroesophageal reflux disease without esophagitis 12/29/2019   Degenerative arthritis of thumb 10/01/2017   Chronic pain of both knees 09/11/2016   Obesity 08/03/2015   History of colonic polyps 06/20/2012   History of colon polyps 06/20/2012   Encounter for routine gynecological examination 05/24/2012   Well adult exam 08/12/2011   ASTHMA, INTERMITTENT, MILD 08/01/2010   HYPERGLYCEMIA, BORDERLINE 06/14/2008   DE QUERVAIN'S TENOSYNOVITIS, LEFT WRIST 07/18/2007   Essential hypertension 06/16/2007    Stark Bray, PT 07/22/2021, 2:47 PM  [place], [place], [address] Phone:  [phone]   Fax:  [phone]  Name: CHARLETTE HENNINGS MRN: [id number] Date of Birth: 1951/04/12

## 2021-07-22 NOTE — Addendum Note (Signed)
Addended by: Stark Bray on: 07/22/2021 02:49 PM   Modules accepted: Orders

## 2021-07-23 ENCOUNTER — Ambulatory Visit: Payer: Medicare HMO

## 2021-07-24 ENCOUNTER — Ambulatory Visit
Admission: RE | Admit: 2021-07-24 | Discharge: 2021-07-24 | Disposition: A | Discharge status: Home / Self Care | Payer: Medicare HMO | Source: Ambulatory Visit | Attending: Radiation Oncology | Admitting: Radiation Oncology

## 2021-07-24 ENCOUNTER — Other Ambulatory Visit: Payer: Self-pay

## 2021-07-24 ENCOUNTER — Ambulatory Visit: Payer: Medicare HMO

## 2021-07-24 DIAGNOSIS — R600 Localized edema: Secondary | ICD-10-CM | POA: Diagnosis not present

## 2021-07-24 DIAGNOSIS — D0512 Intraductal carcinoma in situ of left breast: Secondary | ICD-10-CM | POA: Diagnosis not present

## 2021-07-24 DIAGNOSIS — R293 Abnormal posture: Secondary | ICD-10-CM

## 2021-07-24 DIAGNOSIS — Z483 Aftercare following surgery for neoplasm: Secondary | ICD-10-CM

## 2021-07-24 DIAGNOSIS — Z510 Encounter for antineoplastic radiation therapy: Secondary | ICD-10-CM | POA: Diagnosis not present

## 2021-07-24 NOTE — Therapy (Signed)
[place], [place], [address] Phone: [phone]   Fax:  [phone]  Physical Therapy Treatment  Patient Details  Name: Kendra Thompson MRN: [id number] Date of Birth: January 13, 1951 Referring Provider (PT): Worthy Flank PA-C   Encounter Date: 07/24/2021   PT End of Session - 07/24/21 1047     Visit Number 7    Number of Visits 14    Date for PT Re-Evaluation 08/19/21    PT Start Time 1003    PT Stop Time 7253    PT Time Calculation (min) 40 min    Activity Tolerance Patient tolerated treatment well    Behavior During Therapy [facility] for tasks assessed/performed             Past Medical History:  Diagnosis Date   Allergy 1971   Arthritis 2017   Asthma 1971   has not been present for several years   Breast cancer ([place]) 04/18/2021   Cataract 2020   Family history of breast cancer 04/30/2021   GERD (gastroesophageal reflux disease)    Headache    Migraines occasionally   Hypertension    Seasonal allergies    Wears glasses     Past Surgical History:  Procedure Laterality Date   BREAST LUMPECTOMY WITH RADIOACTIVE SEED LOCALIZATION Left 05/20/2021   Procedure: LEFT BREAST BRACKETED LUMPECTOMY WITH RADIOACTIVE SEED LOCALIZATION x3;  Surgeon: Stark Klein, MD;  Location: [facility];  Service: General;  Laterality: Left;   CLOSED REDUCTION FINGER WITH PERCUTANEOUS PINNING Left 07/25/2013   Procedure: CLOSED REDUCTION FINGER WITH PERCUTANEOUS PINNING LEFT SMALL METACARPAL;  Surgeon: Tennis Must, MD;  Location: [place];  Service: Orthopedics;  Laterality: Left;   COLONOSCOPY  2013,2014   DIAGNOSTIC LAPAROSCOPY  1992   explor-   DILATION AND CURETTAGE OF UTERUS     FRACTURE SURGERY  2014   HAND SURGERY  02/2020    There were no vitals filed for this visit.   Subjective Assessment - 07/24/21 1006     Subjective My skin is a little pink but no sores or rash. It is pretty  sore.  I haven't had any firm spots since last night.  I did the pathways last night but there are no spots today.  I have been working on the scar.    Pertinent History Left lumpectomy 05/20/2021 with no LN removed.  [place] being at the beach with high barometric pressure noted increase in left breast swelling/firmness    Patient Stated Goals Decrease left breast swelling    Currently in Pain? Yes    Pain Score 3     Pain Location Breast    Pain Orientation Left    Pain Descriptors / Indicators Tender;Sore    Multiple Pain Sites No                [facility] PT Assessment - 07/24/21 0001       Assessment   Medical Diagnosis Left breast swelling                           [facility] Adult PT Treatment/Exercise - 07/24/21 0001       Manual Therapy   Manual Lymphatic Drainage (MLD) MLD to left breast in supine: supraclavicular, 5 diaphragmatic breaths, bil axillary LNs, left inguinal LN's, Anterior interaxillary pathway, left axillo-inguinal pathway,and left breast medial to upper pathway and lateral breast to axillo-inguinal pathway, then into Rt S/L for  further work along lateral aspect of breast redirecting fluid towards posterior inter-axillary and Lt axillo-inguinal anastomosis, then finished in supine retracing pathways and ending with LN's. Avoided deeper breast work and direct over breast today due to tenderness and increasing redness                          PT Long Term Goals - 06/23/21 1857       PT LONG TERM GOAL #1   Title Pt/Pts husband wll be independent in left breast MLD    Time 4    Period Weeks    Status New    Target Date 07/21/21      PT LONG TERM GOAL #2   Title pt will purchase compression bra to control left breast swelling    Time 4    Period Weeks    Status New    Target Date 07/21/21      PT LONG TERM GOAL #3   Title Pt will attend ABC class    Time 4    Period Weeks    Status New    Target Date 07/21/21      PT LONG  TERM GOAL #4   Title pt will report decreased left breast heaviness by 50% or more    Time 4    Period Weeks    Status New    Target Date 07/21/21                   Plan - 07/24/21 1048     Clinical Impression Statement Continued MLD but stopped at radiation field secondary to increased soreness and mild redness from radiation. Pt had only 1 very small area of fibrosis at the medial breast. pt continues to be compliant with ROM exs.    Comorbidities left breast lumpectomy DCIS, No LN removed but will have radiation    Stability/Clinical Decision Making Stable/Uncomplicated    Rehab Potential Excellent    PT Frequency 2x / week    PT Duration 4 weeks    PT Treatment/Interventions Therapeutic exercise;Manual techniques;Patient/family education;Manual lymph drainage;Scar mobilization    PT Next Visit Plan MLD for the left breast as tolerated - stopped at radiation field this session, PROM    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   Onychomycosis of toenail 03/27/2021   Postmenopausal 12/29/2019   Gastroesophageal reflux disease without esophagitis 12/29/2019   Degenerative arthritis of thumb 10/01/2017   Chronic pain of both knees 09/11/2016   Obesity 08/03/2015   History of colonic polyps 06/20/2012   History of colon polyps 06/20/2012   Encounter for routine gynecological  examination 05/24/2012   Well adult exam 08/12/2011   ASTHMA, INTERMITTENT, MILD 08/01/2010   HYPERGLYCEMIA, BORDERLINE 06/14/2008   DE QUERVAIN'S TENOSYNOVITIS, LEFT WRIST 07/18/2007   Essential hypertension 06/16/2007    Elsie Ra  Vida, PT 07/24/2021, 10:51 AM  [place], [place], [address] Phone: [phone]   Fax:  [phone]  Name: RAMIE PALLADINO MRN: [id number] Date of Birth: 1951-07-02

## 2021-07-25 ENCOUNTER — Ambulatory Visit
Admission: RE | Admit: 2021-07-25 | Discharge: 2021-07-25 | Disposition: A | Discharge status: Home / Self Care | Payer: Medicare HMO | Source: Ambulatory Visit | Attending: Radiation Oncology | Admitting: Radiation Oncology

## 2021-07-25 ENCOUNTER — Other Ambulatory Visit: Payer: Self-pay

## 2021-07-25 DIAGNOSIS — Z510 Encounter for antineoplastic radiation therapy: Secondary | ICD-10-CM | POA: Diagnosis not present

## 2021-07-25 DIAGNOSIS — D0512 Intraductal carcinoma in situ of left breast: Secondary | ICD-10-CM | POA: Diagnosis not present

## 2021-07-28 ENCOUNTER — Ambulatory Visit
Admission: RE | Admit: 2021-07-28 | Discharge: 2021-07-28 | Disposition: A | Discharge status: Home / Self Care | Payer: Medicare HMO | Source: Ambulatory Visit | Attending: Radiation Oncology | Admitting: Radiation Oncology

## 2021-07-28 ENCOUNTER — Other Ambulatory Visit: Payer: Self-pay

## 2021-07-28 DIAGNOSIS — Z510 Encounter for antineoplastic radiation therapy: Secondary | ICD-10-CM | POA: Insufficient documentation

## 2021-07-28 DIAGNOSIS — D0512 Intraductal carcinoma in situ of left breast: Secondary | ICD-10-CM | POA: Insufficient documentation

## 2021-07-28 NOTE — Progress Notes (Signed)
[place]  Telephone:([phone] [phone]     ID: Kendra Thompson DOB: [birth year]  MR#: [id number]  FAO#:[id number]  Patient Care Team: [name], MD as PCP - General Rockwell Germany, RN as Oncology Nurse Navigator Mauro Kaufmann, RN as Oncology Nurse Navigator Stark Klein, MD as Consulting Physician (General Surgery) Kyung Rudd, MD as Consulting Physician (Radiation Oncology) Findley Blankenbaker, Virgie Dad, MD as Consulting Physician (Oncology) Roseanne Kaufman, MD as Consulting Physician (Orthopedic Surgery) Chauncey Cruel, MD OTHER MD:  CHIEF COMPLAINT: Noninvasive estrogen receptor positive breast cancer  CURRENT TREATMENT: adjuvant radiation   INTERVAL HISTORY: Kendra Thompson returns today for follow up of her noninvasive breast cancer. She was evaluated in the multidisciplinary breast cancer clinic on 04/30/2021.  Since consultation, she underwent breast MRI on 05/05/2021 showing: breast composition B; biopsy-proven left breast DCIS; suspicious linear non-mass enhancement in lateral retroareolar left breast measuring 1.5 cm.  She proceeded to biopsy of the left breast area in question on 05/12/2021. Pathology [id number]) showed: lobular neoplasia.  She underwent left lumpectomy on 05/20/2021 under Dr. Barry Dienes. Pathology from the procedure [id number]) showed: ductal carcinoma in situ, intermediate to high grade with necrosis and calcifications, 3.3 cm; resection margins negative.  She was referred back to Dr. Lisbeth Renshaw and PA Shona Simpson on 06/18/2021. She subsequently began radiation treatment on 07/08/2021 and is scheduled to finish on 08/05/2021.   REVIEW OF SYSTEMS: Kendra Thompson is tolerating radiation well.  She tells me she has no pain significant redness or peeling.  She does feel tired.  She is to walk about 2 miles a day but has a severe bunion in her left foot which is keeping her from walking as much as she would like.  Aside from that a detailed review  of systems today was stable.   COVID 19 VACCINATION STATUS: Moderna x3   HISTORY OF CURRENT ILLNESS: From the original intake note:  Kendra Thompson had routine screening mammography on 04/03/2021 showing a possible abnormality in the left breast. She underwent left diagnostic mammography with tomography at [facility] on 04/11/2021 showing: breast density category B; indeterminate calcifications in lower-outer left breast, with largest area measuring 4.9 cm.  Accordingly on 04/18/2021 she proceeded to biopsy of the left breast area in question. The pathology from this procedure ([id number]) showed: ductal carcinoma in situ, intermediate grade. Prognostic indicators significant for: estrogen receptor, 95% positive and progesterone receptor, 80% positive, both with strong staining intensity.   Cancer Staging Ductal carcinoma in situ (DCIS) of left breast Staging form: Breast, AJCC 8th Edition - Clinical stage from 04/30/2021: Stage 0 (cTis (DCIS), cN0, cM0, ER+, PR+) - Signed by Chauncey Cruel, MD on 04/30/2021 Stage prefix: Initial diagnosis Nuclear grade: G2  The patient's subsequent history is as detailed below.   PAST MEDICAL HISTORY: Past Medical History:  Diagnosis Date   Allergy 1971   Arthritis 2017   Asthma 1971   has not been present for several years   Breast cancer ([place]) 04/18/2021   Cataract 2020   Family history of breast cancer 04/30/2021   GERD (gastroesophageal reflux disease)    Headache    Migraines occasionally   Hypertension    Seasonal allergies    Wears glasses     PAST SURGICAL HISTORY: Past Surgical History:  Procedure Laterality Date   BREAST LUMPECTOMY WITH RADIOACTIVE SEED LOCALIZATION Left 05/20/2021   Procedure: LEFT BREAST BRACKETED LUMPECTOMY WITH RADIOACTIVE SEED LOCALIZATION x3;  Surgeon: Stark Klein, MD;  Location:  [facility];  Service: General;  Laterality: Left;   CLOSED REDUCTION FINGER WITH PERCUTANEOUS PINNING Left 07/25/2013    Procedure: CLOSED REDUCTION FINGER WITH PERCUTANEOUS PINNING LEFT SMALL METACARPAL;  Surgeon: Tennis Must, MD;  Location: [facility];  Service: Orthopedics;  Laterality: Left;   COLONOSCOPY  2013,2014   DIAGNOSTIC LAPAROSCOPY  1992   explor-   DILATION AND CURETTAGE OF UTERUS     FRACTURE SURGERY  2014   HAND SURGERY  02/2020    FAMILY HISTORY: Family History  Problem Relation Age of Onset   Breast cancer Mother 66   Arthritis Mother    Cancer Mother    Depression Mother    Heart disease Mother    Hypertension Mother    CAD Father        Diagnosed in early 47s   Arthritis Father    Hearing loss Father    Heart disease Father    Esophageal cancer Brother 51   Other Brother 53       neuroendocrine tumor   Alcohol abuse Brother    Breast cancer Maternal Grandmother        dx after 19   Diabetes Maternal Grandmother    Heart disease Maternal Grandmother    Hypertension Maternal Grandmother    Colon cancer Neg Hx   Her father died at age 11 from heart attack and COPD. Her mother died at age 58 from complications of breast cancer. She was initially diagnosed at age 40, had bilateral cancer, and recurred later in life. Kendra Thompson has one brother and one sister. In addition to her mother, she reports breast cancer in her maternal grandmother (age unknown) and her sister ("pre-cancer") at age 30. She also reports cancer in her brother-- stomach at age 59 and neuroendocrine at age 52.   GYNECOLOGIC HISTORY:  No LMP recorded. Patient is postmenopausal. Menarche: 70 years old GX P 0 LMP ~age 88 Contraceptive: used only for one year in mid-20's. HRT never used  Hysterectomy? no BSO? no   SOCIAL HISTORY: (updated 04/2021)  Kendra Thompson is currently retired from working in Development worker, international aid with [employer]. Husband Ray "Oletta Lamas" is a retired Art therapist; he was a Publishing rights manager. 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    ADVANCED DIRECTIVES: in place   HEALTH MAINTENANCE: Social  History   Tobacco Use   Smoking status: Never   Smokeless tobacco: Never  Vaping Use   Vaping Use: Never used  Substance Use Topics   Alcohol use: Not Currently    Comment: very rarely   Drug use: Never     Colonoscopy: 09/2017 (Dr. Fuller Plan), recall 2023  PAP: 07/2015, negative  Bone density: 02/2020, -0.7   Allergies  Allergen Reactions   Latex     rash   Tape Rash    Current Outpatient Medications  Medication Sig Dispense Refill   anastrozole (ARIMIDEX) 1 MG tablet Take 1 tablet (1 mg total) by mouth daily. Start 08/26/2021 90 tablet 4   amLODipine (NORVASC) 10 MG tablet TAKE 1 TABLET BY MOUTH EVERY DAY (Patient taking differently: Take 10 mg by mouth daily.) 90 tablet 3   Candesartan Cilexetil-HCTZ 32-25 MG TABS TAKE 1 TABLET BY MOUTH EVERY DAY (Patient taking differently: Take 1 tablet by mouth daily.) 90 tablet 3   fexofenadine (ALLEGRA) 180 MG tablet Take 180 mg by mouth daily as needed for allergies or rhinitis.     ibuprofen (ADVIL) 200 MG tablet Take 200-600 mg by mouth every 6 (six) hours  as needed for headache or moderate pain.     MULTIPLE VITAMIN PO Take 1 tablet by mouth daily.     Olopatadine HCl (PATADAY OP) Place 1 drop into both eyes daily as needed (allergies).     pantoprazole (PROTONIX) 40 MG tablet TAKE 1 TABLET BY MOUTH EVERY DAY (Patient taking differently: Take 40 mg by mouth daily.) 90 tablet 3   terbinafine (LAMISIL) 250 MG tablet Take 1 tablet (250 mg total) by mouth daily. 90 tablet 0   No current facility-administered medications for this visit.    OBJECTIVE: White woman in no acute distress  Vitals:   07/29/21 1141  BP: (!) 151/61  Pulse: 89  SpO2: 99%      Body mass index is 38.21 kg/m.   Wt Readings from Last 3 Encounters:  07/29/21 251 lb 4.8 oz (114 kg)  05/20/21 245 lb (111.1 kg)  05/07/21 248 lb 8 oz (112.7 kg)      ECOG FS:1 - Symptomatic but completely ambulatory  Sclerae unicteric, EOMs intact Wearing a mask No cervical  or supraclavicular adenopathy Lungs no rales or rhonchi Heart regular rate and rhythm Abd soft, nontender, positive bowel sounds MSK no focal spinal tenderness, no upper extremity lymphedema Neuro: nonfocal, well oriented, appropriate affect Breasts: The right breast is unremarkable.  The left breast is status post lumpectomy and is currently receiving radiation.  There is no significant erythema or desquamation.   LAB RESULTS:  CMP     Component Value Date/Time   NA 141 04/30/2021 0754   NA 139 03/26/2021 1007   K 4.1 04/30/2021 0754   CL 103 04/30/2021 0754   CO2 26 04/30/2021 0754   GLUCOSE 124 (H) 04/30/2021 0754   BUN 17 04/30/2021 0754   BUN 18 03/26/2021 1007   CREATININE 0.93 04/30/2021 0754   CREATININE 0.76 09/09/2016 0928   CALCIUM 9.9 04/30/2021 0754   PROT 7.6 04/30/2021 0754   PROT 6.8 03/26/2021 1007   ALBUMIN 3.6 04/30/2021 0754   ALBUMIN 4.4 03/26/2021 1007   AST 17 04/30/2021 0754   ALT 20 04/30/2021 0754   ALKPHOS 71 04/30/2021 0754   BILITOT 0.6 04/30/2021 0754   GFRNONAA >60 04/30/2021 0754   GFRNONAA 84 09/09/2016 0928   GFRAA 81 02/19/2020 0915   GFRAA >89 09/09/2016 0928    No results found for: TOTALPROTELP, ALBUMINELP, A1GS, A2GS, BETS, BETA2SER, GAMS, MSPIKE, SPEI  Lab Results  Component Value Date   WBC 9.2 04/30/2021   NEUTROABS 5.6 04/30/2021   HGB 13.4 04/30/2021   HCT 40.1 04/30/2021   MCV 89.5 04/30/2021   PLT 303 04/30/2021    No results found for: LABCA2  No components found for: QZRAQT622  No results for input(s): INR in the last 168 hours.  No results found for: LABCA2  No results found for: QJF354  No results found for: TGY563  No results found for: SLH734  No results found for: CA2729  No components found for: HGQUANT  No results found for: CEA1 / No results found for: CEA1   No results found for: AFPTUMOR  No results found for: CHROMOGRNA  No results found for: KPAFRELGTCHN, LAMBDASER,  KAPLAMBRATIO (kappa/lambda light chains)  No results found for: HGBA, HGBA2QUANT, HGBFQUANT, HGBSQUAN (Hemoglobinopathy evaluation)   No results found for: LDH  No results found for: IRON, TIBC, IRONPCTSAT (Iron and TIBC)  No results found for: FERRITIN  Urinalysis    Component Value Date/Time   COLORURINE yellow 07/30/2010 0816   APPEARANCEUR  Clear 07/30/2010 0816   LABSPEC 1.025 02/03/2014 0947   PHURINE 6.0 02/03/2014 0947   GLUCOSEU NEGATIVE 02/03/2014 0947   HGBUR TRACE (A) 02/03/2014 0947   HGBUR trace-intact 07/30/2010 0816   BILIRUBINUR NEGATIVE 02/03/2014 0947   KETONESUR NEGATIVE 02/03/2014 0947   PROTEINUR NEGATIVE 02/03/2014 0947   UROBILINOGEN 0.2 02/03/2014 0947   NITRITE NEGATIVE 02/03/2014 0947   LEUKOCYTESUR NEGATIVE 02/03/2014 0947    STUDIES: No results found.   ELIGIBLE FOR AVAILABLE RESEARCH PROTOCOL: Opted against COMET trial  ASSESSMENT: 70 y.o. [place] woman status post left breast biopsy 04/18/2021 for ductal carcinoma in situ, grade 2, estrogen and progesterone receptor positive  (1) genetics testing July 13 and 20, 2022 through the [facility] Panel and CancerNext-Expanded +RNAinsight Panels found no deleterious mutations in ATM, BRCA1, BRCA2, CDH1, CHEK2, PALB2, PTEN, and TP53. AIP, ALK, APC, ATM, AXIN2, BAP1, BARD1, BLM, BMPR1A, BRCA1, BRCA2, BRIP1, CDC73, CDH1, CDK4, CDKN1B, CDKN2A, CHEK2, CTNNA1, DICER1, FANCC, FH, FLCN, GALNT12, KIF1B, LZTR1, MAX, MEN1, MET, MLH1, MSH2, MSH3, MSH6, MUTYH, NBN, NF1, NF2, NTHL1, PALB2, PHOX2B, PMS2, POT1, PRKAR1A, PTCH1, PTEN, RAD51C, RAD51D, RB1, RECQL, RET, SDHA, SDHAF2, SDHB, SDHC, SDHD, SMAD4, SMARCA4, SMARCB1, SMARCE1, STK11, SUFU, TMEM127, TP53, TSC1, TSC2, VHL and XRCC2 (sequencing and deletion/duplication); EGFR, EGLN1, HOXB13, KIT, MITF, PDGFRA, POLD1, and POLE (sequencing only); EPCAM and GREM1 (deletion/duplication only).   (2) status post left lumpectomy 05/20/2021 for a 3.3 cm ductal carcinoma  in situ, described as intermediate to high-grade, with negative margins  (3) adjuvant radiation to be completed 08/05/2021  (4) antiestrogens  (A) bone density 03/18/2020 at the [facility] shows a T score of -0.7 (normal)   PLAN: Kendra Thompson will complete her local treatment for her noninvasive breast cancer next week.  She generally has done very well.  She understands that this was not a life-threatening breast cancer.  She does have a risk of local recurrence which would necessitate mastectomy as standard of care and of course she has the risk of developing a new breast cancer in either breast.  Accordingly we discussed antiestrogens.  Specifically we reviewed the difference between tamoxifen and anastrozole in detail. She understands that anastrozole and the aromatase inhibitors in general work by blocking estrogen production. Accordingly vaginal dryness, decrease in bone density, and of course hot flashes can result. The aromatase inhibitors can also negatively affect the cholesterol profile, although that is a minor effect. One out of 5 women on aromatase inhibitors we will feel "old and achy". This arthralgia/myalgia syndrome, which resembles fibromyalgia clinically, does resolve with stopping the medications. Accordingly this is not a reason to not try an aromatase inhibitor but it is a frequent reason to stop it (in other words 20% of women will not be able to tolerate these medications).  Tamoxifen on the other hand does not block estrogen production. It does not "take away a woman's estrogen". It blocks the estrogen receptor in breast cells. Like anastrozole, it can also cause hot flashes. As opposed to anastrozole, tamoxifen has many estrogen-like effects. It is technically an estrogen receptor modulator. This means that in some tissues tamoxifen works like estrogen-- for example it helps strengthen the bones. It tends to improve the cholesterol profile. It can cause thickening of the  endometrial lining, and even endometrial polyps or rarely cancer of the uterus.(The risk of uterine cancer due to tamoxifen is one additional cancer per thousand women year). It can cause vaginal wetness or stickiness. It can cause blood clots through this estrogen-like effect--the risk  of blood clots with tamoxifen is exactly the same as with birth control pills or hormone replacement.  Neither of these agents causes mood changes or weight gain, despite the popular belief that they can have these side effects. We have data from studies comparing either of these drugs with placebo, and in those cases the control group had the same amount of weight gain and depression as the group that took the drug.  Given her excellent bone density I think she would be a good candidate for anastrozole.  Target start date is 08/26/2021 and I will check with her in early December to make sure she is tolerating it well.  If she is we will continue that for 5 years.  If not likely we will observe without systemic treatment.  Has a good understanding of this plan and is in agreement with the  Total encounter time 35 minutes.Sarajane Jews C. Narek Kniss, MD 07/29/2021 11:59 AM Medical Oncology and Hematology [facility] [place], [place] [address] Tel. [phone]    Fax. [phone]   This document serves as a record of services personally performed by Lurline Del, MD. It was created on his behalf by Wilburn Mylar, a trained medical scribe. The creation of this record is based on the scribe's personal observations and the provider's statements to them.   I, Lurline Del MD, have reviewed the above documentation for accuracy and completeness, and I agree with the above.   *Total Encounter Time as defined by the Centers for Medicare and Medicaid Services includes, in addition to the face-to-face time of a patient visit (documented in the note above) non-face-to-face time: obtaining  and reviewing outside history, ordering and reviewing medications, tests or procedures, care coordination (communications with other health care professionals or caregivers) and documentation in the medical record.

## 2021-07-29 ENCOUNTER — Ambulatory Visit
Admission: RE | Admit: 2021-07-29 | Discharge: 2021-07-29 | Disposition: A | Discharge status: Home / Self Care | Payer: Medicare HMO | Source: Ambulatory Visit | Attending: Radiation Oncology | Admitting: Radiation Oncology

## 2021-07-29 ENCOUNTER — Inpatient Hospital Stay: Payer: Medicare HMO | Attending: Oncology | Admitting: Oncology

## 2021-07-29 VITALS — BP 151/61 | HR 89 | Wt 251.3 lb

## 2021-07-29 DIAGNOSIS — Z510 Encounter for antineoplastic radiation therapy: Secondary | ICD-10-CM | POA: Diagnosis not present

## 2021-07-29 DIAGNOSIS — Z79811 Long term (current) use of aromatase inhibitors: Secondary | ICD-10-CM | POA: Diagnosis not present

## 2021-07-29 DIAGNOSIS — D0512 Intraductal carcinoma in situ of left breast: Secondary | ICD-10-CM | POA: Insufficient documentation

## 2021-07-29 MED ORDER — ANASTROZOLE 1 MG PO TABS: 1.0000 mg | ORAL_TABLET | Freq: Every day | ORAL | 4 refills | Status: DC

## 2021-07-30 ENCOUNTER — Ambulatory Visit: Payer: Medicare HMO

## 2021-07-30 ENCOUNTER — Ambulatory Visit: Payer: Medicare HMO | Attending: Radiation Oncology | Admitting: Rehabilitation

## 2021-07-30 ENCOUNTER — Encounter: Payer: Self-pay | Admitting: Rehabilitation

## 2021-07-30 ENCOUNTER — Other Ambulatory Visit: Payer: Self-pay

## 2021-07-30 ENCOUNTER — Ambulatory Visit
Admission: RE | Admit: 2021-07-30 | Discharge: 2021-07-30 | Disposition: A | Discharge status: Home / Self Care | Payer: Medicare HMO | Source: Ambulatory Visit | Attending: Radiation Oncology | Admitting: Radiation Oncology

## 2021-07-30 DIAGNOSIS — Z483 Aftercare following surgery for neoplasm: Secondary | ICD-10-CM | POA: Diagnosis not present

## 2021-07-30 DIAGNOSIS — R293 Abnormal posture: Secondary | ICD-10-CM | POA: Insufficient documentation

## 2021-07-30 DIAGNOSIS — R600 Localized edema: Secondary | ICD-10-CM | POA: Diagnosis not present

## 2021-07-30 DIAGNOSIS — D0512 Intraductal carcinoma in situ of left breast: Secondary | ICD-10-CM | POA: Diagnosis not present

## 2021-07-30 DIAGNOSIS — Z510 Encounter for antineoplastic radiation therapy: Secondary | ICD-10-CM | POA: Diagnosis not present

## 2021-07-30 NOTE — Therapy (Signed)
Kendra Thompson @ [place], [place], [address] Phone:     Fax:     Physical Therapy Treatment  Patient Details  Name: Kendra Thompson MRN: [id number] Date of Birth: 17-Jun-1951 Referring Provider (PT): Kendra Flank PA-C   Encounter Date: 07/30/2021   PT End of Session - 07/30/21 0932     Visit Number 8    Number of Visits 14    Date for PT Re-Evaluation 08/19/21    PT Start Time 0933    PT Stop Time 1011    PT Time Calculation (min) 38 min    Activity Tolerance Patient tolerated treatment well    Behavior During Therapy [facility] for tasks assessed/performed             Past Medical History:  Diagnosis Date   Allergy 1971   Arthritis 2017   Asthma 1971   has not been present for several years   Breast cancer ([facility]) 04/18/2021   Cataract 2020   Family history of breast cancer 04/30/2021   GERD (gastroesophageal reflux disease)    Headache    Migraines occasionally   Hypertension    Seasonal allergies    Wears glasses     Past Surgical History:  Procedure Laterality Date   BREAST LUMPECTOMY WITH RADIOACTIVE SEED LOCALIZATION Left 05/20/2021   Procedure: LEFT BREAST BRACKETED LUMPECTOMY WITH RADIOACTIVE SEED LOCALIZATION x3;  Surgeon: Stark Klein, MD;  Location: [facility];  Service: General;  Laterality: Left;   CLOSED REDUCTION FINGER WITH PERCUTANEOUS PINNING Left 07/25/2013   Procedure: CLOSED REDUCTION FINGER WITH PERCUTANEOUS PINNING LEFT SMALL METACARPAL;  Surgeon: Tennis Must, MD;  Location: [facility];  Service: Orthopedics;  Laterality: Left;   COLONOSCOPY  2013,2014   DIAGNOSTIC LAPAROSCOPY  1992   explor-   DILATION AND CURETTAGE OF UTERUS     FRACTURE SURGERY  2014   HAND SURGERY  02/2020    There were no vitals filed for this visit.   Subjective Assessment - 07/30/21 0834     Subjective Skin not any worse.  Already radiation today - 4 more left.  A little swollen  this week    Pertinent History Left lumpectomy 05/20/2021 with no LN removed.  [place] the beach with [place] barometric pressure noted increase in left breast swelling/firmness    Currently in Pain? No/denies                               [facility] Adult PT Treatment/Exercise - 07/30/21 0001       Manual Therapy   Manual Lymphatic Drainage (MLD) MLD to left breast in supine: supraclavicular, 5 diaphragmatic breaths, bil axillary LNs, left inguinal LN's, Anterior interaxillary pathway, left axillo-inguinal pathway,and left breast medial to upper pathway and lateral breast to axillo-inguinal pathway, then into Rt S/L for further work along lateral aspect of breast redirecting fluid towards posterior inter-axillary and Lt axillo-inguinal anastomosis, then finished in supine retracing pathways and ending with LN's. Avoided deeper breast work and direct over breast today due to tenderness and increasing redness                          PT Long Term Goals - 06/23/21 1857       PT LONG TERM GOAL #1   Title Pt/Pts husband wll be independent in left breast MLD  Time 4    Period Weeks    Status New    [place] Date 07/21/21      PT LONG TERM GOAL #2   Title pt will purchase compression bra to control left breast swelling    Time 4    Period Weeks    Status New    Target Date 07/21/21      PT LONG TERM GOAL #3   Title Pt will attend ABC class    Time 4    Period Weeks    Status New    Target Date 07/21/21      PT LONG TERM GOAL #4   Title pt will report decreased left breast heaviness by 50% or more    Time 4    Period Weeks    Status New    Target Date 07/21/21                   Plan - 07/30/21 0935     Clinical Impression Statement Continued MLD today with breast surprisiningly less red despite continued radiation.  Skin is starting to darken and pt has 4 sessions left.  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   Onychomycosis of toenail 03/27/2021   Postmenopausal 12/29/2019   Gastroesophageal reflux disease without esophagitis 12/29/2019   Degenerative arthritis of thumb 10/01/2017   Chronic pain of both knees 09/11/2016   Obesity 08/03/2015   History of colonic polyps 06/20/2012   History of colon polyps 06/20/2012   Encounter for routine gynecological examination 05/24/2012   Well adult exam 08/12/2011   ASTHMA, INTERMITTENT, MILD 08/01/2010   HYPERGLYCEMIA, BORDERLINE 06/14/2008   DE QUERVAIN'S TENOSYNOVITIS, LEFT WRIST 07/18/2007   Essential hypertension 06/16/2007    Stark Bray, PT 07/30/2021, 9:37 AM  [facility] @ [place], [place], [address] Phone:     Fax:     Name: Kendra Thompson MRN: [id number] Date of Birth: October 22, 1951

## 2021-07-31 ENCOUNTER — Ambulatory Visit: Payer: Medicare HMO

## 2021-07-31 ENCOUNTER — Ambulatory Visit
Admission: RE | Admit: 2021-07-31 | Discharge: 2021-07-31 | Disposition: A | Discharge status: Home / Self Care | Payer: Medicare HMO | Source: Ambulatory Visit | Attending: Radiation Oncology | Admitting: Radiation Oncology

## 2021-07-31 DIAGNOSIS — D0512 Intraductal carcinoma in situ of left breast: Secondary | ICD-10-CM | POA: Diagnosis not present

## 2021-07-31 DIAGNOSIS — Z510 Encounter for antineoplastic radiation therapy: Secondary | ICD-10-CM | POA: Diagnosis not present

## 2021-08-01 ENCOUNTER — Other Ambulatory Visit: Payer: Self-pay

## 2021-08-01 ENCOUNTER — Ambulatory Visit
Admission: RE | Admit: 2021-08-01 | Discharge: 2021-08-01 | Disposition: A | Discharge status: Home / Self Care | Payer: Medicare HMO | Source: Ambulatory Visit | Attending: Radiation Oncology | Admitting: Radiation Oncology

## 2021-08-01 DIAGNOSIS — Z510 Encounter for antineoplastic radiation therapy: Secondary | ICD-10-CM | POA: Diagnosis not present

## 2021-08-01 DIAGNOSIS — D0512 Intraductal carcinoma in situ of left breast: Secondary | ICD-10-CM | POA: Diagnosis not present

## 2021-08-04 ENCOUNTER — Other Ambulatory Visit: Payer: Self-pay

## 2021-08-04 ENCOUNTER — Ambulatory Visit: Payer: Medicare HMO

## 2021-08-04 ENCOUNTER — Encounter: Payer: Self-pay | Admitting: *Deleted

## 2021-08-04 ENCOUNTER — Ambulatory Visit
Admission: RE | Admit: 2021-08-04 | Discharge: 2021-08-04 | Disposition: A | Discharge status: Home / Self Care | Payer: Medicare HMO | Source: Ambulatory Visit | Attending: Radiation Oncology | Admitting: Radiation Oncology

## 2021-08-04 DIAGNOSIS — D0512 Intraductal carcinoma in situ of left breast: Secondary | ICD-10-CM

## 2021-08-04 DIAGNOSIS — Z510 Encounter for antineoplastic radiation therapy: Secondary | ICD-10-CM | POA: Diagnosis not present

## 2021-08-05 ENCOUNTER — Ambulatory Visit
Admission: RE | Admit: 2021-08-05 | Discharge: 2021-08-05 | Disposition: A | Discharge status: Home / Self Care | Payer: Medicare HMO | Source: Ambulatory Visit | Attending: Radiation Oncology | Admitting: Radiation Oncology

## 2021-08-05 ENCOUNTER — Encounter: Payer: Self-pay | Admitting: Radiation Oncology

## 2021-08-05 DIAGNOSIS — D0512 Intraductal carcinoma in situ of left breast: Secondary | ICD-10-CM | POA: Diagnosis not present

## 2021-08-05 DIAGNOSIS — Z510 Encounter for antineoplastic radiation therapy: Secondary | ICD-10-CM | POA: Diagnosis not present

## 2021-08-07 ENCOUNTER — Other Ambulatory Visit: Payer: Self-pay

## 2021-08-07 ENCOUNTER — Ambulatory Visit: Payer: Medicare HMO | Admitting: Rehabilitation

## 2021-08-11 NOTE — Progress Notes (Signed)
                                                                                                                                                             Patient Name: NASHLEY CORDOBA MRN: [id number] DOB: 03/18/1951 Referring Physician: Dorcas Mcmurray (Profile Not Attached) Date of Service: 08/05/2021 [facility], [place]                                                        End Of Treatment Note  Diagnoses: D05.12-Intraductal carcinoma in situ of left breast  Cancer Staging: Intermediate to High Grade, ER/PR positive DCIS of the left breast.  Intent: Curative  Radiation Treatment Dates: 07/08/2021 through 08/05/2021 Site Technique Total Dose (Gy) Dose per Fx (Gy) Completed Fx Beam Energies  Breast, Left: Breast_Lt 3D 42.56/42.56 2.66 16/16 10X  Breast, Left: Breast_Lt_Bst 3D 8/8 2 4/4 6X, 10X   Narrative: The patient tolerated radiation therapy relatively well. She developed fatigue and anticipated skin changes in the treatment field.   Plan: The patient will receive a call in about one month from the radiation oncology department. She will continue follow up with Dr. Jana Hakim as well. .  ________________________________________________    Carola Rhine, PAC

## 2021-08-19 ENCOUNTER — Telehealth: Payer: Self-pay | Admitting: *Deleted

## 2021-08-20 DIAGNOSIS — H25013 Cortical age-related cataract, bilateral: Secondary | ICD-10-CM | POA: Diagnosis not present

## 2021-08-20 DIAGNOSIS — H35372 Puckering of macula, left eye: Secondary | ICD-10-CM | POA: Diagnosis not present

## 2021-08-20 DIAGNOSIS — H2513 Age-related nuclear cataract, bilateral: Secondary | ICD-10-CM | POA: Diagnosis not present

## 2021-08-20 DIAGNOSIS — H35033 Hypertensive retinopathy, bilateral: Secondary | ICD-10-CM | POA: Diagnosis not present

## 2021-08-25 ENCOUNTER — Other Ambulatory Visit: Payer: Self-pay

## 2021-08-25 ENCOUNTER — Ambulatory Visit: Payer: Medicare HMO

## 2021-08-25 DIAGNOSIS — R600 Localized edema: Secondary | ICD-10-CM | POA: Diagnosis not present

## 2021-08-25 DIAGNOSIS — Z483 Aftercare following surgery for neoplasm: Secondary | ICD-10-CM

## 2021-08-25 DIAGNOSIS — R293 Abnormal posture: Secondary | ICD-10-CM

## 2021-08-25 NOTE — Therapy (Addendum)
[facility] @ [place], [place], [address] Phone: [phone]   Fax:  [phone]  Physical Therapy Treatment  Patient Details  Name: Kendra Thompson MRN: [id number] Date of Birth: 09-30-1951 Referring Provider (PT): Worthy Flank PA-C   Encounter Date: 08/25/2021   PT End of Session - 08/25/21 1103     Visit Number 10    Number of Visits 14    Date for PT Re-Evaluation 08/19/21   D/C this visit   PT Start Time 5102    PT Stop Time 1103    PT Time Calculation (min) 56 min    Activity Tolerance Patient tolerated treatment well    Behavior During Therapy [facility] for tasks assessed/performed             Past Medical History:  Diagnosis Date   Allergy 1971   Arthritis 2017   Asthma 1971   has not been present for several years   Breast cancer ([place]) 04/18/2021   Cataract 2020   Family history of breast cancer 04/30/2021   GERD (gastroesophageal reflux disease)    Headache    Migraines occasionally   Hypertension    Seasonal allergies    Wears glasses     Past Surgical History:  Procedure Laterality Date   BREAST LUMPECTOMY WITH RADIOACTIVE SEED LOCALIZATION Left 05/20/2021   Procedure: LEFT BREAST BRACKETED LUMPECTOMY WITH RADIOACTIVE SEED LOCALIZATION x3;  Surgeon: Stark Klein, MD;  Location: [facility];  Service: General;  Laterality: Left;   CLOSED REDUCTION FINGER WITH PERCUTANEOUS PINNING Left 07/25/2013   Procedure: CLOSED REDUCTION FINGER WITH PERCUTANEOUS PINNING LEFT SMALL METACARPAL;  Surgeon: Tennis Must, MD;  Location: [facility];  Service: Orthopedics;  Laterality: Left;   COLONOSCOPY  2013,2014   DIAGNOSTIC LAPAROSCOPY  1992   explor-   DILATION AND CURETTAGE OF UTERUS     FRACTURE SURGERY  2014   HAND SURGERY  02/2020    There were no vitals filed for this visit.                      [facility] Adult PT Treatment/Exercise - 08/25/21 0001        Manual Therapy   Soft tissue mobilization Scar tissue mobs where scar tissue and very mild fibrosis palpable    Manual Lymphatic Drainage (MLD) MLD to left breast in supine: supraclavicular, 5 diaphragmatic breaths, bil axillary LNs, left inguinal LN's, Anterior interaxillary pathway, left axillo-inguinal pathway,and left breast medial to upper pathway and lateral breast to axillo-inguinal pathway, then into Rt S/L for further work along lateral aspect of breast redirecting fluid towards Lt axillo-inguinal anastomosis, then finished in supine retracing pathways and ending with LN's.                          PT Long Term Goals - 08/25/21 1015       PT LONG TERM GOAL #1   Title Pt/Pts husband wll be independent in left breast MLD    Baseline Pt and husband are both independent with (self) MLD - 08/25/21    Status Achieved      PT LONG TERM GOAL #2   Title pt will purchase compression bra to control left breast swelling    Baseline Pt has this and reports wearing it prn, but less so lately as her breast has improved - 08/25/21    Status Achieved  PT LONG TERM GOAL #3   Title Pt will attend ABC class    Baseline Pt has done this andhas no quetions regarding lymphedema risk - 08/25/21    Status Achieved      PT LONG TERM GOAL #4   Title pt will report decreased left breast heaviness by 50% or more    Baseline Pt reports 100% improvement at this time - 08/25/21    Status Achieved                   Plan - 08/25/21 1104     Clinical Impression Statement Pt has met all goals and reports doing very well since radiation ended almost 3 weeks ago. Her skin is discolored and peeling but overall healing very well and very mild fibrosis palpable today that softened well with MLD. Pt is ready for D/C at this time.    Comorbidities left breast lumpectomy DCIS, No LN removed but will have radiation    Stability/Clinical Decision Making Stable/Uncomplicated    Rehab  Potential Excellent    PT Frequency 2x / week    PT Duration 4 weeks    PT Treatment/Interventions Therapeutic exercise;Manual techniques;Patient/family education;Manual lymph drainage;Scar mobilization    PT Next Visit Plan D/C this visit.    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   Onychomycosis of toenail 03/27/2021   Postmenopausal 12/29/2019   Gastroesophageal reflux disease without esophagitis 12/29/2019   Degenerative arthritis of thumb 10/01/2017   Chronic pain of both knees 09/11/2016   Obesity 08/03/2015   History of colonic polyps 06/20/2012   History of colon polyps 06/20/2012   Encounter for routine gynecological examination 05/24/2012   Well adult exam 08/12/2011   ASTHMA, INTERMITTENT, MILD 08/01/2010   HYPERGLYCEMIA, BORDERLINE 06/14/2008   DE QUERVAIN'S TENOSYNOVITIS, LEFT WRIST 07/18/2007   Essential hypertension 06/16/2007    Otelia Limes, PTA 08/25/2021, 12:36 PM  [facility] @ [place], [place], [address] Phone: [phone]   Fax:  [phone]  Name: [name] MRN: [id number] Date of Birth: 1951-03-06   PHYSICAL THERAPY DISCHARGE SUMMARY  Visits from Start of Care: 10  Current functional level related to goals / functional outcomes:see above   Remaining deficits: See  above   Education / Equipment: See above Plan: Patient agrees to discharge.  Patient goals were not met. Patient is being discharged due to meeting the stated rehab goals.     Shan Levans, PT

## 2021-08-26 ENCOUNTER — Encounter: Payer: Self-pay | Admitting: *Deleted

## 2021-08-26 ENCOUNTER — Inpatient Hospital Stay: Payer: Medicare HMO | Attending: Oncology | Admitting: *Deleted

## 2021-08-26 VITALS — BP 124/62 | HR 84 | Temp 98.2°F | Resp 18 | Ht 68.0 in | Wt 253.0 lb

## 2021-08-26 DIAGNOSIS — D0512 Intraductal carcinoma in situ of left breast: Secondary | ICD-10-CM

## 2021-08-26 NOTE — Progress Notes (Signed)
  SCP reviewed and completed. SDOH assessed and completed. No barriers or needs at this time. Pt graduated from lymphedema rehab yesterday but will continue to do exercises at home. Pt is exercising 7 days a week, walks about 2 miles a day. Pt is current with vaccines/ immunizations. Pt started on anastrozole 08/26/21. She will continue for 5 years. I educated her about some of the side effects that she may experience.Pt will have next colonoscopy in 2023. Bone density in 2023. Pt denies pain and says,"I feel good".

## 2021-09-08 ENCOUNTER — Ambulatory Visit
Admission: RE | Admit: 2021-09-08 | Discharge: 2021-09-08 | Disposition: A | Discharge status: Home / Self Care | Payer: Medicare HMO | Source: Ambulatory Visit | Attending: Radiation Oncology | Admitting: Radiation Oncology

## 2021-09-08 DIAGNOSIS — D0512 Intraductal carcinoma in situ of left breast: Secondary | ICD-10-CM

## 2021-09-08 NOTE — Progress Notes (Signed)
  Radiation Oncology         [phone] ________________________________  Name: Kendra Thompson MRN: [id number]  Date of Service: 09/08/2021  DOB: December 21, 1950  Post Treatment Telephone Note  Diagnosis:    Intermediate to High Grade, ER/PR positive DCIS of the left breast.  Interval Since Last Radiation:  5 weeks    07/08/2021 through 08/05/2021 Site Technique Total Dose (Gy) Dose per Fx (Gy) Completed Fx Beam Energies  Breast, Left: Breast_Lt 3D 42.56/42.56 2.66 16/16 10X  Breast, Left: Breast_Lt_Bst 3D 8/8 2 4/4 6X, 10X    Narrative:  The patient was contacted today for routine follow-up. During treatment she did very well with radiotherapy and did not have significant desquamation. She reports she feels the skin is gradually improving. Her fatigue, lymphedema, and discomfort of the skin continues to improve.  Impression/Plan: 1.  Intermediate to High Grade, ER/PR positive DCIS of the left breast.. The patient has been doing well since completion of radiotherapy. We discussed that we would be happy to continue to follow her as needed, but she will also continue to follow up with Dr. Jana Hakim in medical oncology. She was counseled on skin care as well as measures to avoid sun exposure to this area.  2. Survivorship. We discussed the importance of survivorship evaluation and encouraged her to attend her upcoming visit with that clinic.       Carola Rhine, PAC

## 2021-09-10 ENCOUNTER — Telehealth: Payer: Self-pay | Admitting: *Deleted

## 2021-09-10 NOTE — Telephone Encounter (Signed)
This RN spoke with pt per her call stating side effects post starting anastrozole on 08/26/2021.  She states she started having hot flashes " though I can handle those but then just this week I have started having migraines "  She states she has had migraines with onset of menopause that resolved " and these feel very much just like them "  She states headache is constant but not interfering with ADL's or appetite.  Per discussion of how anastrozole works and likeliness of it being the cause of migraines- with possible transient side effect that will resolve like when she had them with menopause.  Isis states she would like to continue on the anastrozole presently- she will monitor and document the migraines occurrences and symptoms.  She will call this RN next week with update and before then if they worsen.  She denies need of any dedicated anti migraine medication " didn't use them before and would prefer not to "  She will institute claritin daily for possible sinus issues.

## 2021-09-29 NOTE — Progress Notes (Signed)
[place]  [phone] [phone]     ID: Kendra Thompson DOB: 01/16/1951  MR#: [id number]  KPT#:[id number]  Patient Care Team: [name], MD as PCP - General Stark Klein, MD as Consulting Physician (General Surgery) Kyung Rudd, MD as Consulting Physician (Radiation Oncology) Magrinat, Virgie Dad, MD as Consulting Physician (Oncology) Roseanne Kaufman, MD as Consulting Physician (Orthopedic Surgery) Harmon Pier, RN as Registered Nurse Delice Bison, Charlestine Massed, NP as Nurse Practitioner (Hematology and Oncology) Aurea Graff OTHER MD:  I connected with Kendra Thompson on 09/29/21 at 11:30 AM EST by telephone visit and verified that I am speaking with the correct person using two identifiers.   I discussed the limitations, risks, security and privacy concerns of performing an evaluation and management service by telemedicine and the availability of in-person appointments. I also discussed with the patient that there may be a patient responsible charge related to this service. The patient expressed understanding and agreed to proceed.   Other persons participating in the visit and their role in the encounter: None  Patient's location: Home Provider's location: [facility]   I provided 20 minutes of non face-to-face telephone visit time during this encounter, and > 50% was spent counseling as documented under my assessment & plan.   CHIEF COMPLAINT: Noninvasive estrogen receptor positive breast cancer  CURRENT TREATMENT: anastrozole   INTERVAL HISTORY: Kendra Thompson was contacted today for follow up of her noninvasive breast cancer.   Since her last visit, she completed radiation therapy on 08/05/2021.  She had no difficulties with that and has recovered nicely from those treatments  She subsequently began anastrozole on 08/26/2021.  She was initially having quite a few hot flashes.  These have become less frequent and  less intense but they still can wake her up at night.  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  The arthritis involves the knees and she also pulled her back working in the yard 2 days ago.  The problems in the hands have been taken care of by Dr. Amedeo Plenty and she has had surgery to the left hand tendinitis but not to the right hand tendinitis.  Her most recent bone density screening from 03/18/2020 showed a T-score of -0.7, which is considered normal.   REVIEW OF SYSTEMS: Aside from the issues mentioned above a detailed review of systems today was stable   COVID 19 VACCINATION STATUS: Moderna x3   HISTORY OF CURRENT ILLNESS: From the original intake note:  Kendra Thompson had routine screening mammography on 04/03/2021 showing a possible abnormality in the left breast. She underwent left diagnostic mammography with tomography at [place] on 04/11/2021 showing: breast density category B; indeterminate calcifications in lower-outer left breast, with largest area measuring 4.9 cm.  Accordingly on 04/18/2021 she proceeded to biopsy of the left breast area in question. The pathology from this procedure ([id number]) showed: ductal carcinoma in situ, intermediate grade. Prognostic indicators significant for: estrogen receptor, 95% positive and progesterone receptor, 80% positive, both with strong staining intensity.    Cancer Staging  Ductal carcinoma in situ (DCIS) of left breast Staging form: Breast, AJCC 8th Edition - Clinical stage from 04/30/2021: Stage 0 (cTis (DCIS), cN0, cM0, ER+, PR+) - Signed by Chauncey Cruel, MD on 04/30/2021 Stage prefix: Initial diagnosis Nuclear grade: G2  The patient's subsequent history is as detailed below.   PAST MEDICAL HISTORY: Past Medical History:  Diagnosis Date   Allergy 1971   Arthritis 2017   Asthma 1971   has not been present for several years   Breast cancer ([place]) 04/18/2021    Cataract 2020   Family history of breast cancer 04/30/2021   GERD (gastroesophageal reflux disease)    Headache    Migraines occasionally   Hypertension    Seasonal allergies    Wears glasses     PAST SURGICAL HISTORY: Past Surgical History:  Procedure Laterality Date   BREAST LUMPECTOMY WITH RADIOACTIVE SEED LOCALIZATION Left 05/20/2021   Procedure: LEFT BREAST BRACKETED LUMPECTOMY WITH RADIOACTIVE SEED LOCALIZATION x3;  Surgeon: Stark Klein, MD;  Location: [facility];  Service: General;  Laterality: Left;   CLOSED REDUCTION FINGER WITH PERCUTANEOUS PINNING Left 07/25/2013   Procedure: CLOSED REDUCTION FINGER WITH PERCUTANEOUS PINNING LEFT SMALL METACARPAL;  Surgeon: Tennis Must, MD;  Location: [facility];  Service: Orthopedics;  Laterality: Left;   COLONOSCOPY  2013,2014   DIAGNOSTIC LAPAROSCOPY  1992   explor-   DILATION AND CURETTAGE OF UTERUS     FRACTURE SURGERY  2014   HAND SURGERY  02/2020    FAMILY HISTORY: Family History  Problem Relation Age of Onset   Breast cancer Mother 42   Arthritis Mother    Cancer Mother    Depression Mother    Heart disease Mother    Hypertension Mother    CAD Father        Diagnosed in early 50s   Arthritis Father    Hearing loss Father    Heart disease Father    Esophageal cancer Brother 25   Other Brother 62       neuroendocrine tumor   Alcohol abuse Brother    Breast cancer Maternal Grandmother        dx after 11   Diabetes Maternal Grandmother    Heart disease Maternal Grandmother    Hypertension Maternal Grandmother    Colon cancer Neg Hx   Her father died at age 56 from heart attack and COPD. Her mother died at age 65 from complications of breast cancer. She was initially diagnosed at age 39, had bilateral cancer, and recurred later in life. Kendra Thompson has one brother and one sister. In addition to her mother, she reports breast cancer in her maternal grandmother (age unknown) and her sister ("pre-cancer") at age 86.  She also reports cancer in her brother-- stomach at age 57 and neuroendocrine at age 20.   GYNECOLOGIC HISTORY:  No LMP recorded. Patient is postmenopausal. Menarche: 70 years old GX P 0 LMP ~age 26 Contraceptive: used only for one year in mid-20's. HRT never used  Hysterectomy? no BSO? no   SOCIAL HISTORY: (updated 04/2021)  Kendra Thompson is currently retired from working in Development worker, international aid with [employer]. Husband Ray "Oletta Lamas" is a retired Art therapist; he was a Publishing rights manager. 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    ADVANCED DIRECTIVES: in place   HEALTH MAINTENANCE: Social History   Tobacco Use   Smoking status: Never   Smokeless tobacco: Never  Vaping Use   Vaping Use: Never used  Substance Use Topics   Alcohol use: Not Currently    Comment: very rarely   Drug use: Never     Colonoscopy: 09/2017 (Dr. Fuller Plan), recall 2023  PAP: 07/2015, negative  Bone density: 02/2020, -0.7   Allergies  Allergen Reactions   Latex     rash   Tape Rash    Current Outpatient Medications  Medication Sig  Dispense Refill   amLODipine (NORVASC) 10 MG tablet TAKE 1 TABLET BY MOUTH EVERY DAY (Patient taking differently: Take 10 mg by mouth daily.) 90 tablet 3   anastrozole (ARIMIDEX) 1 MG tablet Take 1 tablet (1 mg total) by mouth daily. Start 08/26/2021 90 tablet 4   Candesartan Cilexetil-HCTZ 32-25 MG TABS TAKE 1 TABLET BY MOUTH EVERY DAY (Patient taking differently: Take 1 tablet by mouth daily.) 90 tablet 3   dextromethorphan-guaiFENesin (MUCINEX DM) 30-600 MG 12hr tablet Take 1 tablet by mouth daily as needed for cough.     fexofenadine (ALLEGRA) 180 MG tablet Take 180 mg by mouth daily as needed for allergies or rhinitis.     ibuprofen (ADVIL) 200 MG tablet Take 200-600 mg by mouth every 6 (six) hours as needed for headache or moderate pain.     MULTIPLE VITAMIN PO Take 1 tablet by mouth daily.     Olopatadine HCl (PATADAY OP) Place 1 drop into both eyes daily as needed (allergies).      pantoprazole (PROTONIX) 40 MG tablet TAKE 1 TABLET BY MOUTH EVERY DAY (Patient taking differently: Take 40 mg by mouth daily.) 90 tablet 3   terbinafine (LAMISIL) 250 MG tablet Take 1 tablet (250 mg total) by mouth daily. (Patient not taking: Reported on 08/26/2021) 90 tablet 0   No current facility-administered medications for this visit.    OBJECTIVE: White woman in no acute distress  There were no vitals filed for this visit.     There is no height or weight on file to calculate BMI.   Wt Readings from Last 3 Encounters:  08/26/21 253 lb (114.8 kg)  07/29/21 251 lb 4.8 oz (114 kg)  05/20/21 245 lb (111.1 kg)     ECOG FS:1 - Symptomatic but completely ambulatory  Telemedicine visit 09/30/2021   LAB RESULTS:  CMP     Component Value Date/Time   NA 141 04/30/2021 0754   NA 139 03/26/2021 1007   K 4.1 04/30/2021 0754   CL 103 04/30/2021 0754   CO2 26 04/30/2021 0754   GLUCOSE 124 (H) 04/30/2021 0754   BUN 17 04/30/2021 0754   BUN 18 03/26/2021 1007   CREATININE 0.93 04/30/2021 0754   CREATININE 0.76 09/09/2016 0928   CALCIUM 9.9 04/30/2021 0754   PROT 7.6 04/30/2021 0754   PROT 6.8 03/26/2021 1007   ALBUMIN 3.6 04/30/2021 0754   ALBUMIN 4.4 03/26/2021 1007   AST 17 04/30/2021 0754   ALT 20 04/30/2021 0754   ALKPHOS 71 04/30/2021 0754   BILITOT 0.6 04/30/2021 0754   GFRNONAA >60 04/30/2021 0754   GFRNONAA 84 09/09/2016 0928   GFRAA 81 02/19/2020 0915   GFRAA >89 09/09/2016 0928    No results found for: TOTALPROTELP, ALBUMINELP, A1GS, A2GS, BETS, BETA2SER, GAMS, MSPIKE, SPEI  Lab Results  Component Value Date   WBC 9.2 04/30/2021   NEUTROABS 5.6 04/30/2021   HGB 13.4 04/30/2021   HCT 40.1 04/30/2021   MCV 89.5 04/30/2021   PLT 303 04/30/2021    No results found for: LABCA2  No components found for: RDEYCX448  No results for input(s): INR in the last 168 hours.  No results found for: LABCA2  No results found for: JEH631  No results found for:  SHF026  No results found for: VZC588  No results found for: CA2729  No components found for: HGQUANT  No results found for: CEA1 / No results found for: CEA1   No results found for: AFPTUMOR  No results  found for: CHROMOGRNA  No results found for: KPAFRELGTCHN, LAMBDASER, KAPLAMBRATIO (kappa/lambda light chains)  No results found for: HGBA, HGBA2QUANT, HGBFQUANT, HGBSQUAN (Hemoglobinopathy evaluation)   No results found for: LDH  No results found for: IRON, TIBC, IRONPCTSAT (Iron and TIBC)  No results found for: FERRITIN  Urinalysis    Component Value Date/Time   COLORURINE yellow 07/30/2010 0816   APPEARANCEUR Clear 07/30/2010 0816   LABSPEC 1.025 02/03/2014 0947   PHURINE 6.0 02/03/2014 [place] 02/03/2014 0947   HGBUR TRACE (A) 02/03/2014 0947   HGBUR trace-intact 07/30/2010 0816   BILIRUBINUR NEGATIVE 02/03/2014 [place] 02/03/2014 0947   PROTEINUR NEGATIVE 02/03/2014 0947   UROBILINOGEN 0.2 02/03/2014 0947   NITRITE NEGATIVE 02/03/2014 0947   LEUKOCYTESUR NEGATIVE 02/03/2014 0947    STUDIES: No results found.   ELIGIBLE FOR AVAILABLE RESEARCH PROTOCOL: Opted against COMET trial  ASSESSMENT: 70 y.o. [place] woman status post left breast biopsy 04/18/2021 for ductal carcinoma in situ, grade 2, estrogen and progesterone receptor positive  (1) genetics testing July 13 and 20, 2022 through the [facility] Panel and CancerNext-Expanded +RNAinsight Panels found no deleterious mutations in ATM, BRCA1, BRCA2, CDH1, CHEK2, PALB2, PTEN, and TP53. AIP, ALK, APC, ATM, AXIN2, BAP1, BARD1, BLM, BMPR1A, BRCA1, BRCA2, BRIP1, CDC73, CDH1, CDK4, CDKN1B, CDKN2A, CHEK2, CTNNA1, DICER1, FANCC, FH, FLCN, GALNT12, KIF1B, LZTR1, MAX, MEN1, MET, MLH1, MSH2, MSH3, MSH6, MUTYH, NBN, NF1, NF2, NTHL1, PALB2, PHOX2B, PMS2, POT1, PRKAR1A, PTCH1, PTEN, RAD51C, RAD51D, RB1, RECQL, RET, SDHA, SDHAF2, SDHB, SDHC, SDHD, SMAD4, SMARCA4, SMARCB1, SMARCE1,  STK11, SUFU, TMEM127, TP53, TSC1, TSC2, VHL and XRCC2 (sequencing and deletion/duplication); EGFR, EGLN1, HOXB13, KIT, MITF, PDGFRA, POLD1, and POLE (sequencing only); EPCAM and GREM1 (deletion/duplication only).   (2) status post left lumpectomy 05/20/2021 for a 3.3 cm ductal carcinoma in situ, described as intermediate to high-grade, with negative margins  (3) adjuvant radiation 07/08/2021 through 08/05/2021 Site Technique Total Dose (Gy) Dose per Fx (Gy) Completed Fx Beam Energies  Breast, Left: Breast_Lt 3D 42.56/42.56 2.66 16/16 10X  Breast, Left: Breast_Lt_Bst 3D 8/8 2 4/4 6X, 10X   (4) anastrozole started 08/26/2021  (A) bone density 03/18/2020 at the [facility] shows a T score of -0.7 (normal)   PLAN: Suly is tolerating anastrozole generally well.  It is difficult to separate arthritis and tendinitis from the arthralgias and myalgias that can accompany anastrozole.  In general the latter should be symmetrical and should involve small joints more than large joints.  I am sure that the knee issues that she is having are really not related to the anastrozole.  On the other hand anastrozole can worsen problems with tendinitis in the hand.  She had surgery to the left and that is doing okay.  The one on the right is having more of an issue.  We will just have to see how this develops.  If the left hand which has already had surgery does get much worse we might have to change drugs.  She is having some night sweats which are waking her up.  We discussed gabapentin and I am going ahead and placing the prescription for her to take at bedtime.  This should help for her f to fall a sleep more easily and also to have fewer bedtime hot flashes  She will have mammography in February and return to see [place] shortly after that.  She knows to call for any other issue that may develop before the next visit.  Virgie Dad.  Magrinat, MD 09/29/2021 9:59 PM Medical Oncology and Hematology [facility] [place], [place] [address] Tel. [phone]    Fax. [phone]   This document serves as a record of services personally performed by Lurline Del, MD. It was created on his behalf by Wilburn Mylar, a trained medical scribe. The creation of this record is based on the scribe's personal observations and the provider's statements to them.   I, Lurline Del MD, have reviewed the above documentation for accuracy and completeness, and I agree with the above.   *Total Encounter Time as defined by the Centers for Medicare and Medicaid Services includes, in addition to the face-to-face time of a patient visit (documented in the note above) non-face-to-face time: obtaining and reviewing outside history, ordering and reviewing medications, tests or procedures, care coordination (communications with other health care professionals or caregivers) and documentation in the medical record.

## 2021-09-30 ENCOUNTER — Inpatient Hospital Stay: Payer: Medicare HMO | Attending: Oncology | Admitting: Oncology

## 2021-09-30 DIAGNOSIS — D0512 Intraductal carcinoma in situ of left breast: Secondary | ICD-10-CM | POA: Diagnosis not present

## 2021-09-30 MED ORDER — GABAPENTIN 300 MG PO CAPS: 300.0000 mg | ORAL_CAPSULE | Freq: Every day | ORAL | 4 refills | Status: DC

## 2021-11-02 ENCOUNTER — Other Ambulatory Visit: Payer: Self-pay | Admitting: Family Medicine

## 2021-11-25 ENCOUNTER — Other Ambulatory Visit: Payer: Self-pay | Admitting: Family Medicine

## 2021-12-10 ENCOUNTER — Other Ambulatory Visit: Payer: Self-pay | Admitting: Family Medicine

## 2021-12-10 ENCOUNTER — Other Ambulatory Visit: Payer: Self-pay | Admitting: Hematology and Oncology

## 2021-12-10 DIAGNOSIS — D0512 Intraductal carcinoma in situ of left breast: Secondary | ICD-10-CM

## 2022-01-02 ENCOUNTER — Other Ambulatory Visit: Payer: Self-pay

## 2022-01-02 ENCOUNTER — Ambulatory Visit
Admission: RE | Admit: 2022-01-02 | Discharge: 2022-01-02 | Disposition: A | Discharge status: Home / Self Care | Payer: Medicare HMO | Source: Ambulatory Visit | Attending: Family Medicine | Admitting: Family Medicine

## 2022-01-02 DIAGNOSIS — R922 Inconclusive mammogram: Secondary | ICD-10-CM | POA: Diagnosis not present

## 2022-01-02 DIAGNOSIS — D0512 Intraductal carcinoma in situ of left breast: Secondary | ICD-10-CM

## 2022-01-13 DIAGNOSIS — H25811 Combined forms of age-related cataract, right eye: Secondary | ICD-10-CM | POA: Diagnosis not present

## 2022-01-13 DIAGNOSIS — H25813 Combined forms of age-related cataract, bilateral: Secondary | ICD-10-CM | POA: Diagnosis not present

## 2022-01-13 DIAGNOSIS — H35433 Paving stone degeneration of retina, bilateral: Secondary | ICD-10-CM | POA: Diagnosis not present

## 2022-01-13 DIAGNOSIS — H35372 Puckering of macula, left eye: Secondary | ICD-10-CM | POA: Diagnosis not present

## 2022-01-13 DIAGNOSIS — H35033 Hypertensive retinopathy, bilateral: Secondary | ICD-10-CM | POA: Diagnosis not present

## 2022-01-14 ENCOUNTER — Other Ambulatory Visit: Payer: Self-pay

## 2022-01-14 ENCOUNTER — Ambulatory Visit
Admission: RE | Admit: 2022-01-14 | Discharge: 2022-01-14 | Disposition: A | Discharge status: Home / Self Care | Payer: Medicare HMO | Source: Ambulatory Visit | Attending: Physician Assistant | Admitting: Physician Assistant

## 2022-01-14 VITALS — BP 135/69 | HR 83 | Temp 98.1°F | Resp 18

## 2022-01-14 DIAGNOSIS — J019 Acute sinusitis, unspecified: Secondary | ICD-10-CM | POA: Diagnosis not present

## 2022-01-14 MED ORDER — PROMETHAZINE-DM 6.25-15 MG/5ML PO SYRP: 5.0000 mL | ORAL_SOLUTION | Freq: Four times a day (QID) | ORAL | 0 refills | Status: DC | PRN

## 2022-01-14 MED ORDER — AMOXICILLIN-POT CLAVULANATE 875-125 MG PO TABS: 1.0000 | ORAL_TABLET | Freq: Two times a day (BID) | ORAL | 0 refills | Status: DC

## 2022-01-14 NOTE — ED Triage Notes (Signed)
Pt c/o cough, sore throat, nasal congestion, ear pressure, headache, faceache,  ? ?Denies nausea, vomiting, diarrhea constipation ? ?Onset ~ Sunday  ?

## 2022-01-14 NOTE — ED Provider Notes (Signed)
?[facility] ? ? ? ?CSN: [id number] ?Arrival date & time: 01/14/22  1327 ? ? ?  ? ?History   ?Chief Complaint ?Chief Complaint  ?Patient presents with  ? Nasal Congestion  ?  head cold symptoms.  have not been in contact with anyone who has Covid. - Entered by patient  ? ? ?HPI ?Kendra Thompson is a 71 y.o. female.  ? ?Patient here today for evaluation of sinus congestion and facial pain, cough, and sore throat that started 4 days ago. She reports that facial pain has seemed to worsen. She has not had any fever. She does not feel  her cough is severe and denies any shortness of breath or wheezing. She has tried OTC meds without resolution.  ? ?The history is provided by the patient.  ? ?Past Medical History:  ?Diagnosis Date  ? Allergy 1971  ? Arthritis 2017  ? Asthma 1971  ? has not been present for several years  ? Breast cancer ([place]) 04/18/2021  ? Cataract 2020  ? Family history of breast cancer 04/30/2021  ? GERD (gastroesophageal reflux disease)   ? Headache   ? Migraines occasionally  ? Hypertension   ? Seasonal allergies   ? Wears glasses   ? ? ?Patient Active Problem List  ? Diagnosis Date Noted  ? Genetic testing 05/08/2021  ? Family history of breast cancer 04/30/2021  ? 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  ? Onychomycosis of toenail 03/27/2021  ? Postmenopausal 12/29/2019  ? Gastroesophageal reflux disease without esophagitis 12/29/2019  ? Degenerative arthritis of thumb 10/01/2017  ? Chronic pain of both knees 09/11/2016  ? Obesity 08/03/2015  ? History of colonic polyps 06/20/2012  ? History of colon polyps 06/20/2012  ? Encounter for routine gynecological examination 05/24/2012  ? Well adult exam 08/12/2011  ? ASTHMA, INTERMITTENT, MILD 08/01/2010  ? HYPERGLYCEMIA, BORDERLINE 06/14/2008  ? DE QUERVAIN'S TENOSYNOVITIS, LEFT WRIST 07/18/2007  ? Essential hypertension 06/16/2007  ? ? ?Past Surgical History:  ?Procedure Laterality Date  ? BREAST LUMPECTOMY    ? BREAST  LUMPECTOMY WITH RADIOACTIVE SEED LOCALIZATION Left 05/20/2021  ? Procedure: LEFT BREAST BRACKETED LUMPECTOMY WITH RADIOACTIVE SEED LOCALIZATION x3;  Surgeon: Stark Klein, MD;  Location: [facility];  Service: General;  Laterality: Left;  ? CLOSED REDUCTION FINGER WITH PERCUTANEOUS PINNING Left 07/25/2013  ? Procedure: CLOSED REDUCTION FINGER WITH PERCUTANEOUS PINNING LEFT SMALL METACARPAL;  Surgeon: Tennis Must, MD;  Location: [facility];  Service: Orthopedics;  Laterality: Left;  ? COLONOSCOPY  2013,2014  ? DIAGNOSTIC LAPAROSCOPY  1992  ? explor-  ? DILATION AND CURETTAGE OF UTERUS    ? FRACTURE SURGERY  2014  ? HAND SURGERY  02/2020  ? ? ?OB History   ?No obstetric history on file. ?  ? ? ? ?Home Medications   ? ?Prior to Admission medications   ?Medication Sig Start Date End Date Taking? Authorizing Provider  ?amoxicillin-clavulanate (AUGMENTIN) 875-125 MG tablet Take 1 tablet by mouth every 12 (twelve) hours. 01/14/22  Yes Francene Finders, PA-C  ?promethazine-dextromethorphan (PROMETHAZINE-DM) 6.25-15 MG/5ML syrup Take 5 mLs by mouth 4 (four) times daily as needed for cough. 01/14/22  Yes Francene Finders, PA-C  ?amLODipine (NORVASC) 10 MG tablet TAKE 1 TABLET BY MOUTH EVERY DAY 11/25/21   [name], MD  ?anastrozole (ARIMIDEX) 1 MG tablet Take 1 tablet (1 mg total) by mouth daily. Start 08/26/2021 07/29/21   Magrinat, Virgie Dad, MD  ?Candesartan Cilexetil-HCTZ  32-25 MG TABS TAKE 1 TABLET BY MOUTH EVERY DAY ?Patient taking differently: Take 1 tablet by mouth daily. 03/25/21   [name], MD  ?fexofenadine (ALLEGRA) 180 MG tablet Take 180 mg by mouth daily as needed for allergies or rhinitis.    [provider]  ?gabapentin (NEURONTIN) 300 MG capsule Take 1 capsule (300 mg total) by mouth at bedtime. 09/30/21   Magrinat, Virgie Dad, MD  ?ibuprofen (ADVIL) 200 MG tablet Take 200-600 mg by mouth every 6 (six) hours as needed for headache or moderate pain.    [provider]  ?MULTIPLE  VITAMIN PO Take 1 tablet by mouth daily.    [provider]  ?Olopatadine HCl (PATADAY OP) Place 1 drop into both eyes daily as needed (allergies).    [provider]  ?pantoprazole (PROTONIX) 40 MG tablet TAKE 1 TABLET BY MOUTH EVERY DAY 11/03/21   [name], MD  ?terbinafine (LAMISIL) 250 MG tablet Take 1 tablet (250 mg total) by mouth daily. ?Patient not taking: Reported on 08/26/2021 03/26/21   [name], MD  ? ? ?Family History ?Family History  ?Problem Relation Age of Onset  ? Breast cancer Mother 24  ? Arthritis Mother   ? Cancer Mother   ? Depression Mother   ? Heart disease Mother   ? Hypertension Mother   ? CAD Father   ?     Diagnosed in early 69s  ? Arthritis Father   ? Hearing loss Father   ? Heart disease Father   ? Esophageal cancer Brother 63  ? Other Brother 41  ?     neuroendocrine tumor  ? Alcohol abuse Brother   ? Breast cancer Maternal Grandmother   ?     dx after 50  ? Diabetes Maternal Grandmother   ? Heart disease Maternal Grandmother   ? Hypertension Maternal Grandmother   ? Colon cancer Neg Hx   ? ? ?Social History ?Social History  ? ?Tobacco Use  ? Smoking status: Never  ? Smokeless tobacco: Never  ?Vaping Use  ? Vaping Use: Never used  ?Substance Use Topics  ? Alcohol use: Not Currently  ?  Comment: very rarely  ? Drug use: Never  ? ? ? ?Allergies   ?Latex and Tape ? ? ?Review of Systems ?Review of Systems  ?Constitutional:  Negative for chills and fever.  ?HENT:  Positive for congestion, sinus pressure and sore throat.   ?Eyes:  Negative for discharge and redness.  ?Respiratory:  Positive for cough. Negative for shortness of breath and wheezing.   ?Gastrointestinal:  Negative for abdominal pain, diarrhea, nausea and vomiting.  ? ? ?Physical Exam ?Triage Vital Signs ?ED Triage Vitals  ?Enc Vitals Group  ?   BP 01/14/22 1440 135/69  ?   Pulse Rate 01/14/22 1440 83  ?   Resp 01/14/22 1440 18  ?   Temp 01/14/22 1440 98.1 ?F (36.7 ?C)  ?   Temp Source 01/14/22 1440 Oral  ?    SpO2 01/14/22 1440 97 %  ?   Weight --   ?   Height --   ?   Head Circumference --   ?   Peak Flow --   ?   Pain Score 01/14/22 1441 0  ?   Pain Loc --   ?   Pain Edu? --   ?   Excl. in [place]? --   ? ?No data found. ? ?Updated Vital Signs ?BP 135/69 (BP Location:  Left Arm)   Pulse 83   Temp 98.1 ?F (36.7 ?C) (Oral)   Resp 18   SpO2 97%  ? ?Physical Exam ?Vitals and nursing note reviewed.  ?Constitutional:   ?   General: She is not in acute distress. ?   Appearance: Normal appearance. She is not ill-appearing.  ?HENT:  ?   Head: Normocephalic and atraumatic.  ?   Nose: Congestion present.  ?   Mouth/Throat:  ?   Mouth: Mucous membranes are moist.  ?   Pharynx: No oropharyngeal exudate or posterior oropharyngeal erythema.  ?Eyes:  ?   Conjunctiva/sclera: Conjunctivae normal.  ?Cardiovascular:  ?   Rate and Rhythm: Normal rate.  ?Pulmonary:  ?   Effort: Pulmonary effort is normal. No respiratory distress.  ?Skin: ?   General: Skin is warm and dry.  ?Neurological:  ?   Mental Status: She is alert.  ?Psychiatric:     ?   Mood and Affect: Mood normal.     ?   Thought Content: Thought content normal.  ? ? ? ?UC Treatments / Results  ?Labs ?(all labs ordered are listed, but only abnormal results are displayed) ?Labs Reviewed - No data to display ? ?EKG ? ? ?Radiology ?No results found. ? ?Procedures ?Procedures (including critical care time) ? ?Medications Ordered in UC ?Medications - No data to display ? ?Initial Impression / Assessment and Plan / UC Course  ?I have reviewed the triage vital signs and the nursing notes. ? ?Pertinent labs & imaging results that were available during my care of the patient were reviewed by me and considered in my medical decision making (see chart for details). ? ?  ?Will treat to cover sinusitis with augmentin and cough syrup prescribed. Recommended follow up if symptoms fail to improve or worsen.  ? ?Final Clinical Impressions(s) / UC Diagnoses  ? ?Final diagnoses:  ?Acute sinusitis,  recurrence not specified, unspecified location  ? ?Discharge Instructions   ?None ?  ? ?ED Prescriptions   ? ? Medication Sig Dispense Auth. Provider  ? amoxicillin-clavulanate (AUGMENTIN) 875-125 MG tablet Rich Number

## 2022-02-04 ENCOUNTER — Encounter (HOSPITAL_COMMUNITY): Payer: Self-pay

## 2022-03-16 ENCOUNTER — Other Ambulatory Visit: Payer: Self-pay | Admitting: Family Medicine

## 2022-03-31 ENCOUNTER — Encounter: Payer: Self-pay | Admitting: *Deleted

## 2022-04-07 ENCOUNTER — Telehealth: Payer: Self-pay | Admitting: Hematology and Oncology

## 2022-04-07 NOTE — Telephone Encounter (Signed)
.  Called patient to schedule appointment per 6/12 inbasket, patient is aware of date and time.

## 2022-04-08 ENCOUNTER — Ambulatory Visit (INDEPENDENT_AMBULATORY_CARE_PROVIDER_SITE_OTHER): Payer: Medicare HMO | Admitting: Family Medicine

## 2022-04-08 ENCOUNTER — Encounter: Payer: Self-pay | Admitting: Family Medicine

## 2022-04-08 VITALS — BP 148/62 | HR 77 | Ht 68.0 in | Wt 251.0 lb

## 2022-04-08 DIAGNOSIS — G8929 Other chronic pain: Secondary | ICD-10-CM

## 2022-04-08 DIAGNOSIS — D0512 Intraductal carcinoma in situ of left breast: Secondary | ICD-10-CM | POA: Diagnosis not present

## 2022-04-08 DIAGNOSIS — Z0000 Encounter for general adult medical examination without abnormal findings: Secondary | ICD-10-CM | POA: Diagnosis not present

## 2022-04-08 DIAGNOSIS — I10 Essential (primary) hypertension: Secondary | ICD-10-CM

## 2022-04-08 DIAGNOSIS — M25562 Pain in left knee: Secondary | ICD-10-CM | POA: Diagnosis not present

## 2022-04-08 DIAGNOSIS — M25561 Pain in right knee: Secondary | ICD-10-CM

## 2022-04-08 DIAGNOSIS — R7309 Other abnormal glucose: Secondary | ICD-10-CM

## 2022-04-08 LAB — POCT GLYCOSYLATED HEMOGLOBIN (HGB A1C): Hemoglobin A1C: 5.9 % — AB (ref 4.0–5.6)

## 2022-04-08 MED ORDER — GABAPENTIN 100 MG PO CAPS: ORAL_CAPSULE | ORAL | 3 refills | Status: DC

## 2022-04-08 NOTE — Patient Instructions (Signed)
Lets try the gabap[entin as an add on prn dose Let me know about your knees in a coup[e of weeks Great to see you!

## 2022-04-09 LAB — COMPREHENSIVE METABOLIC PANEL
ALT: 26 IU/L (ref 0–32)
AST: 15 IU/L (ref 0–40)
Albumin/Globulin Ratio: 1.6 (ref 1.2–2.2)
Albumin: 4.4 g/dL (ref 3.8–4.8)
Alkaline Phosphatase: 82 IU/L (ref 44–121)
BUN/Creatinine Ratio: 21 (ref 12–28)
BUN: 20 mg/dL (ref 8–27)
Bilirubin Total: 0.5 mg/dL (ref 0.0–1.2)
CO2: 26 mmol/L (ref 20–29)
Calcium: 9.9 mg/dL (ref 8.7–10.3)
Chloride: 98 mmol/L (ref 96–106)
Creatinine, Ser: 0.96 mg/dL (ref 0.57–1.00)
Globulin, Total: 2.8 g/dL (ref 1.5–4.5)
Glucose: 124 mg/dL — ABNORMAL HIGH (ref 70–99)
Potassium: 4.8 mmol/L (ref 3.5–5.2)
Sodium: 138 mmol/L (ref 134–144)
Total Protein: 7.2 g/dL (ref 6.0–8.5)
eGFR: 64 mL/min/{1.73_m2} (ref >59)

## 2022-04-09 LAB — LIPID PANEL
Chol/HDL Ratio: 5.2 ratio — ABNORMAL HIGH (ref 0.0–4.4)
Cholesterol, Total: 193 mg/dL (ref 100–199)
HDL: 37 mg/dL — ABNORMAL LOW (ref >39)
LDL Chol Calc (NIH): 129 mg/dL — ABNORMAL HIGH (ref 0–99)
Triglycerides: 147 mg/dL (ref 0–149)
VLDL Cholesterol Cal: 27 mg/dL (ref 5–40)

## 2022-04-09 NOTE — Assessment & Plan Note (Signed)
We will check A1c today. 

## 2022-04-09 NOTE — Assessment & Plan Note (Signed)
Reviewed her last x-rays which were 5 years ago.  She had some medial joint space narrowing on both sides at that time.  She would like to consider corticosteroid injections in both knees which we did today.  She will let me know in 1 to 2 weeks how that is doing.  At some point in the near future we might need to get some updated x-rays particularly if the corticosteroid injection does not help.

## 2022-04-09 NOTE — Progress Notes (Signed)
    CHIEF COMPLAINT / HPI: Here for health maintenance update, will check in with several issues 1.  Bilateral knees are really giving her a fit.  Pain all the time. 2.  Intermittent left hand/thumb pain.  Her hand surgeon thinks it is related to some neuropathy there.  The gabapentin she was using for her hot flashes from breast cancer treatment seem to help that some. 3.  Breast cancer: Continue to follow-up with oncology.  They did put her on gabapentin and that is helpful.  She wonders about increasing dose.   PERTINENT  PMH / PSH: I have reviewed the patient's medications, allergies, past medical and surgical history, smoking status and updated in the EMR as appropriate.   OBJECTIVE:  BP (!) 148/62   Pulse 77   Ht '5\' 8"'$  (1.727 m)   Wt 251 lb (113.9 kg)   SpO2 98%   BMI 38.16 kg/m  Vital signs reviewed. GENERAL: Well-developed, well-nourished, no acute distress. CARDIOVASCULAR: Regular rate and rhythm no murmur gallop or rub LUNGS: Clear to auscultation bilaterally, no rales or wheeze. ABDOMEN: Soft positive bowel sounds NEURO: No gross focal neurological deficits. MSK: Movement of extremity x 4.  Knees bilaterally are symmetrical without any sign of effusion.  She has medial joint line tenderness on both knees.  She has full range of motion flexion extension. PSYCH: AxOx4. Good eye contact.. No psychomotor retardation or agitation. Appropriate speech fluency and content. Asks and answers questions appropriately. Mood is congruent.  PROCEDURE: INJECTION: Patient was given informed consent, signed copy in the chart. Appropriate time out was taken. Area prepped and draped in usual sterile fashion. Ethyl chloride was  used for local anesthesia. A 21 gauge 1 1/2 inch needle was used..  1 cc of methylprednisolone 40 mg/ml plus 4 cc of 1% lidocaine without epinephrine was injected into the bilateral knees using a(n) anterior medial approach.   The patient tolerated the procedure well.  There were no complications. Post procedure instructions were given.    ASSESSMENT / PLAN: For her hot flashes they had given her some gabapentin and she is still at lower dose of 300.  I will add as needed 100 mg 1-3 times a day on top of that to see if we can help her with her left hand pain and see if this will also give her added protection from the hot flashes.  She will let me know how this is doing.  Well adult exam Check labs today.  We discussed weight management.  HYPERGLYCEMIA, BORDERLINE We will check A1c today.  Chronic pain of both knees Reviewed her last x-rays which were 5 years ago.  She had some medial joint space narrowing on both sides at that time.  She would like to consider corticosteroid injections in both knees which we did today.  She will let me know in 1 to 2 weeks how that is doing.  At some point in the near future we might need to get some updated x-rays particularly if the corticosteroid injection does not help.   Dorcas Mcmurray MD

## 2022-04-09 NOTE — Assessment & Plan Note (Signed)
Check labs today.  We discussed weight management.

## 2022-04-09 NOTE — Assessment & Plan Note (Signed)
>>  ASSESSMENT AND PLAN FOR ARTHRITIS OF BOTH KNEES WRITTEN ON 04/09/2022  5:44 PM BY Treena Cosman L, MD  Reviewed her last x-rays which were 5 years ago.  She had some medial joint space narrowing on both sides at that time.  She would like to consider corticosteroid injections in both knees which we did today.  She will let me know in 1 to 2 weeks how that is doing.  At some point in the near future we might need to get some updated x-rays particularly if the corticosteroid injection does not help.

## 2022-04-10 ENCOUNTER — Encounter: Payer: Self-pay | Admitting: Family Medicine

## 2022-04-15 DIAGNOSIS — H25811 Combined forms of age-related cataract, right eye: Secondary | ICD-10-CM | POA: Diagnosis not present

## 2022-04-15 DIAGNOSIS — H268 Other specified cataract: Secondary | ICD-10-CM | POA: Diagnosis not present

## 2022-04-21 DIAGNOSIS — H25812 Combined forms of age-related cataract, left eye: Secondary | ICD-10-CM | POA: Diagnosis not present

## 2022-04-29 DIAGNOSIS — H268 Other specified cataract: Secondary | ICD-10-CM | POA: Diagnosis not present

## 2022-04-29 DIAGNOSIS — H25812 Combined forms of age-related cataract, left eye: Secondary | ICD-10-CM | POA: Diagnosis not present

## 2022-05-27 ENCOUNTER — Encounter: Payer: Self-pay | Admitting: Hematology and Oncology

## 2022-05-27 ENCOUNTER — Inpatient Hospital Stay: Payer: Medicare HMO | Attending: Hematology and Oncology | Admitting: Hematology and Oncology

## 2022-05-27 DIAGNOSIS — Z923 Personal history of irradiation: Secondary | ICD-10-CM | POA: Insufficient documentation

## 2022-05-27 DIAGNOSIS — Z79811 Long term (current) use of aromatase inhibitors: Secondary | ICD-10-CM | POA: Insufficient documentation

## 2022-05-27 DIAGNOSIS — D0512 Intraductal carcinoma in situ of left breast: Secondary | ICD-10-CM

## 2022-05-27 DIAGNOSIS — Z79899 Other long term (current) drug therapy: Secondary | ICD-10-CM | POA: Insufficient documentation

## 2022-05-27 DIAGNOSIS — Z170 Estrogen receptor positive status [ER+]: Secondary | ICD-10-CM | POA: Insufficient documentation

## 2022-05-27 NOTE — Progress Notes (Signed)
[facility]  [phone] [phone]     ID: Kendra Thompson DOB: 1951-10-23  MR#: [id number]  LKT#:[id number]  Patient Care Team: [name], MD as PCP - General Stark Klein, MD as Consulting Physician (General Surgery) Kyung Rudd, MD as Consulting Physician (Radiation Oncology) Magrinat, Virgie Dad, MD (Inactive) as Consulting Physician (Oncology) Roseanne Kaufman, MD as Consulting Physician (Orthopedic Surgery) Harmon Pier, RN as Registered Nurse Causey, Charlestine Massed, NP as Nurse Practitioner (Hematology and Oncology) [name], MD  CHIEF COMPLAINT: Noninvasive estrogen receptor positive breast cancer  CURRENT TREATMENT:   INTERVAL HISTORY:  Kendra Thompson returns today for follow up of her noninvasive breast cancer.  Mammogram March 2023, no mammographic evidence of malignancy Since last visit, she has been taking Arimidex as prescribed.  She does notice some hot flashes for which she takes gabapentin at night as well as some arthralgias.  She otherwise denies any changes in her breast.  She occasionally feels a left axillary lymph node, last felt about couple months ago which spontaneously resolved in a day.   Rest of the pertinent 10 point ROS reviewed and negative   COVID 19 VACCINATION STATUS: Moderna x3   HISTORY OF CURRENT ILLNESS: From the original intake note:  Kendra Thompson had routine screening mammography on 04/03/2021 showing a possible abnormality in the left breast. She underwent left diagnostic mammography with tomography at [facility] on 04/11/2021 showing: breast density category B; indeterminate calcifications in lower-outer left breast, with largest area measuring 4.9 cm.  Accordingly on 04/18/2021 she proceeded to biopsy of the left breast area in question. The pathology from this procedure ([id number]) showed: ductal carcinoma in situ, intermediate grade. Prognostic indicators significant for: estrogen  receptor, 95% positive and progesterone receptor, 80% positive, both with strong staining intensity.    Cancer Staging  Ductal carcinoma in situ (DCIS) of left breast Staging form: Breast, AJCC 8th Edition - Clinical stage from 04/30/2021: Stage 0 (cTis (DCIS), cN0, cM0, ER+, PR+) - Signed by Chauncey Cruel, MD on 04/30/2021 Stage prefix: Initial diagnosis Nuclear grade: G2   The patient's subsequent history is as detailed below.   PAST MEDICAL HISTORY: Past Medical History:  Diagnosis Date   Allergy 1971   Arthritis 2017   Asthma 1971   has not been present for several years   Breast cancer ([place]) 04/18/2021   Cataract 2020   Family history of breast cancer 04/30/2021   GERD (gastroesophageal reflux disease)    Headache    Migraines occasionally   Hypertension    Seasonal allergies    Wears glasses     PAST SURGICAL HISTORY: Past Surgical History:  Procedure Laterality Date   BREAST LUMPECTOMY     BREAST LUMPECTOMY WITH RADIOACTIVE SEED LOCALIZATION Left 05/20/2021   Procedure: LEFT BREAST BRACKETED LUMPECTOMY WITH RADIOACTIVE SEED LOCALIZATION x3;  Surgeon: Stark Klein, MD;  Location: [facility];  Service: General;  Laterality: Left;   CLOSED REDUCTION FINGER WITH PERCUTANEOUS PINNING Left 07/25/2013   Procedure: CLOSED REDUCTION FINGER WITH PERCUTANEOUS PINNING LEFT SMALL METACARPAL;  Surgeon: Tennis Must, MD;  Location: [facility];  Service: Orthopedics;  Laterality: Left;   COLONOSCOPY  2013,2014   DIAGNOSTIC LAPAROSCOPY  1992   explor-   DILATION AND CURETTAGE OF UTERUS     FRACTURE SURGERY  2014   HAND SURGERY  02/2020    FAMILY HISTORY: Family History  Problem Relation Age of Onset   Breast cancer Mother 49  Arthritis Mother    Cancer Mother    Depression Mother    Heart disease Mother    Hypertension Mother    CAD Father        Diagnosed in early 42s   Arthritis Father    Hearing loss Father    Heart disease Father    Esophageal  cancer Brother 30   Other Brother 86       neuroendocrine tumor   Alcohol abuse Brother    Breast cancer Maternal Grandmother        dx after 36   Diabetes Maternal Grandmother    Heart disease Maternal Grandmother    Hypertension Maternal Grandmother    Colon cancer Neg Hx   Her father died at age 35 from heart attack and COPD. Her mother died at age 68 from complications of breast cancer. She was initially diagnosed at age 57, had bilateral cancer, and recurred later in life. [place] has one brother and one sister. In addition to her mother, she reports breast cancer in her maternal grandmother (age unknown) and her sister ("pre-cancer") at age 38. She also reports cancer in her brother-- stomach at age 79 and neuroendocrine at age 110.   GYNECOLOGIC HISTORY:  No LMP recorded. Patient is postmenopausal. Menarche: 71 years old GX P 0 LMP ~age 89 Contraceptive: used only for one year in mid-20's. HRT never used  Hysterectomy? no BSO? 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 Husband Ray "Oletta Lamas" is a retired Art therapist; he was a Publishing rights manager. 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    ADVANCED DIRECTIVES: in place   HEALTH MAINTENANCE: Social History   Tobacco Use   Smoking status: Never   Smokeless tobacco: Never  Vaping Use   Vaping Use: Never used  Substance Use Topics   Alcohol use: Not Currently    Comment: very rarely   Drug use: Never     Colonoscopy: 09/2017 (Dr. Fuller Plan), recall 2023  PAP: 07/2015, negative  Bone density: 02/2020, -0.7   Allergies  Allergen Reactions   Latex     rash   Tape Rash    Current Outpatient Medications  Medication Sig Dispense Refill   amLODipine (NORVASC) 10 MG tablet TAKE 1 TABLET BY MOUTH EVERY DAY 90 tablet 3   amoxicillin-clavulanate (AUGMENTIN) 875-125 MG tablet Take 1 tablet by mouth every 12 (twelve) hours. 14 tablet 0   anastrozole (ARIMIDEX) 1 MG  tablet Take 1 tablet (1 mg total) by mouth daily. Start 08/26/2021 90 tablet 4   Candesartan Cilexetil-HCTZ 32-25 MG TABS TAKE 1 TABLET BY MOUTH EVERY DAY 90 tablet 3   fexofenadine (ALLEGRA) 180 MG tablet Take 180 mg by mouth daily as needed for allergies or rhinitis.     gabapentin (NEURONTIN) 100 MG capsule Take by mouth one in AM and one midday in addition to other dose of 300 mg at night for max 500 mg daily 90 capsule 3   gabapentin (NEURONTIN) 300 MG capsule Take 1 capsule (300 mg total) by mouth at bedtime. 90 capsule 4   ibuprofen (ADVIL) 200 MG tablet Take 200-600 mg by mouth every 6 (six) hours as needed for headache or moderate pain.     MULTIPLE VITAMIN PO Take 1 tablet by mouth daily.     Olopatadine HCl (PATADAY OP) Place 1 drop into both eyes daily as needed (allergies).     pantoprazole (PROTONIX) 40 MG tablet TAKE 1  TABLET BY MOUTH EVERY DAY 90 tablet 3   promethazine-dextromethorphan (PROMETHAZINE-DM) 6.25-15 MG/5ML syrup Take 5 mLs by mouth 4 (four) times daily as needed for cough. 118 mL 0   No current facility-administered medications for this visit.    OBJECTIVE: White woman in no acute distress  There were no vitals filed for this visit.     There is no height or weight on file to calculate BMI.   Wt Readings from Last 3 Encounters:  04/08/22 251 lb (113.9 kg)  08/26/21 253 lb (114.8 kg)  07/29/21 251 lb 4.8 oz (114 kg)      ECOG FS:1 - Symptomatic but completely ambulatory  Physical Exam Constitutional:      Appearance: Normal appearance.  Chest:     Comments: Bilateral breasts inspected.  Left breast smaller secondary to surgery and postradiation changes.  No palpable masses.  No regional adenopathy Musculoskeletal:     Cervical back: Normal range of motion and neck supple. No rigidity.  Neurological:     Mental Status: She is alert.       LAB RESULTS:  CMP     Component Value Date/Time   NA 138 04/08/2022 0913   K 4.8 04/08/2022 0913   CL 98  04/08/2022 0913   CO2 26 04/08/2022 0913   GLUCOSE 124 (H) 04/08/2022 0913   GLUCOSE 124 (H) 04/30/2021 0754   BUN 20 04/08/2022 0913   CREATININE 0.96 04/08/2022 0913   CREATININE 0.93 04/30/2021 0754   CREATININE 0.76 09/09/2016 0928   CALCIUM 9.9 04/08/2022 0913   PROT 7.2 04/08/2022 0913   ALBUMIN 4.4 04/08/2022 0913   AST 15 04/08/2022 0913   AST 17 04/30/2021 0754   ALT 26 04/08/2022 0913   ALT 20 04/30/2021 0754   ALKPHOS 82 04/08/2022 0913   BILITOT 0.5 04/08/2022 0913   BILITOT 0.6 04/30/2021 0754   GFRNONAA >60 04/30/2021 0754   GFRNONAA 84 09/09/2016 0928   GFRAA 81 02/19/2020 0915   GFRAA >89 09/09/2016 0928    No results found for: "TOTALPROTELP", "ALBUMINELP", "A1GS", "A2GS", "BETS", "BETA2SER", "GAMS", "MSPIKE", "SPEI"  Lab Results  Component Value Date   WBC 9.2 04/30/2021   NEUTROABS 5.6 04/30/2021   HGB 13.4 04/30/2021   HCT 40.1 04/30/2021   MCV 89.5 04/30/2021   PLT 303 04/30/2021    No results found for: "LABCA2"  No components found for: "XBLTJQ300"  No results for input(s): "INR" in the last 168 hours.  No results found for: "LABCA2"  No results found for: "PQZ300"  No results found for: "CAN125"  No results found for: "CAN153"  No results found for: "CA2729"  No components found for: "HGQUANT"  No results found for: "CEA1", "CEA" / No results found for: "CEA1", "CEA"   No results found for: "AFPTUMOR"  No results found for: "CHROMOGRNA"  No results found for: "KPAFRELGTCHN", "LAMBDASER", "KAPLAMBRATIO" (kappa/lambda light chains)  No results found for: "HGBA", "HGBA2QUANT", "HGBFQUANT", "HGBSQUAN" (Hemoglobinopathy evaluation)   No results found for: "LDH"  No results found for: "IRON", "TIBC", "IRONPCTSAT" (Iron and TIBC)  No results found for: "FERRITIN"  Urinalysis    Component Value Date/Time   COLORURINE yellow 07/30/2010 0816   APPEARANCEUR Clear 07/30/2010 0816   LABSPEC 1.025 02/03/2014 0947   PHURINE 6.0  02/03/2014 [place] 02/03/2014 [place] (A) 02/03/2014 0947   HGBUR trace-intact 07/30/2010 0816   BILIRUBINUR NEGATIVE 02/03/2014 0947   KETONESUR NEGATIVE 02/03/2014 0947   PROTEINUR NEGATIVE  02/03/2014 0947   UROBILINOGEN 0.2 02/03/2014 0947   NITRITE NEGATIVE 02/03/2014 0947   LEUKOCYTESUR NEGATIVE 02/03/2014 0947    STUDIES: No results found.   ELIGIBLE FOR AVAILABLE RESEARCH PROTOCOL: Opted against COMET trial  ASSESSMENT: 71 y.o. Thompson woman status post left breast biopsy 04/18/2021 for ductal carcinoma in situ, grade 2, estrogen and progesterone receptor positive  (1) genetics testing July 13 and 20, 2022 through the [place] Panel and [facility]-Expanded +RNAinsight Panels found no deleterious mutations in ATM, BRCA1, BRCA2, CDH1, CHEK2, PALB2, PTEN, and TP53. AIP, ALK, APC, ATM, AXIN2, BAP1, BARD1, BLM, BMPR1A, BRCA1, BRCA2, BRIP1, CDC73, CDH1, CDK4, CDKN1B, CDKN2A, CHEK2, CTNNA1, DICER1, FANCC, FH, FLCN, GALNT12, KIF1B, LZTR1, MAX, MEN1, MET, MLH1, MSH2, MSH3, MSH6, MUTYH, NBN, NF1, NF2, NTHL1, PALB2, PHOX2B, PMS2, POT1, PRKAR1A, PTCH1, PTEN, RAD51C, RAD51D, RB1, RECQL, RET, SDHA, SDHAF2, SDHB, SDHC, SDHD, SMAD4, SMARCA4, SMARCB1, SMARCE1, STK11, SUFU, TMEM127, TP53, TSC1, TSC2, VHL and XRCC2 (sequencing and deletion/duplication); EGFR, EGLN1, HOXB13, KIT, MITF, PDGFRA, POLD1, and POLE (sequencing only); EPCAM and GREM1 (deletion/duplication only).   (2) status post left lumpectomy 05/20/2021 for a 3.3 cm ductal carcinoma in situ, described as intermediate to high-grade, with negative margins  (3) adjuvant radiation to be completed 08/05/2021  (4) antiestrogens  (A) bone density 03/18/2020 at the [facility] shows a T score of -0.7 (normal)   PLAN:  She is anastrozole for adjuvant anti estrogen therapy She is tolerating this very well except for some arthralgias and hot flashes.  She takes gabapentin nightly 300 mg for management of  hot flashes.  She also takes 100 mg of gabapentin as needed during the day for management of some neuropathic pain. No concerning findings on physical exam.  She will be due for mammogram in March 2024 She has a follow-up with Dr. Barry Dienes coming up in a month.  She was encouraged to do self breast exams and report any changes to [place].  She will otherwise return to clinic in March 2024.   With regards to bone density, I have discussed about staying active.  Her previous bone density was normal in May 2021.  We will consider doing another bone density scan next year.  Total time : 30 minutes.  *Total Encounter Time as defined by the Centers for Medicare and Medicaid Services includes, in addition to the face-to-face time of a patient visit (documented in the note above) non-face-to-face time: obtaining and reviewing outside history, ordering and reviewing medications, tests or procedures, care coordination (communications with other health care professionals or caregivers) and documentation in the medical record.

## 2022-06-25 ENCOUNTER — Telehealth: Payer: Self-pay | Admitting: *Deleted

## 2022-06-25 NOTE — Patient Outreach (Signed)
  Care Coordination   Initial Visit Note   06/25/2022 Name: Kendra Thompson MRN: [id number] DOB: 1951/01/17  Kendra Thompson is a 71 y.o. year old female who sees [name], MD for primary care. I spoke with  Kendra Thompson by phone today.  What matters to the patients health and wellness today?  No concerns expressed. Patient has completed annual wellness visit.  RN discussed services [facility] services, RN, SW, and Pharmacist. Patient declined services.     Goals Addressed   None     SDOH assessments and interventions completed:  Yes     Care Coordination Interventions Activated:  Yes  Care Coordination Interventions:  Yes, provided   Follow up plan: No further intervention required.   Encounter Outcome:  Pt. [facility] [phone]

## 2022-06-30 DIAGNOSIS — I890 Lymphedema, not elsewhere classified: Secondary | ICD-10-CM | POA: Diagnosis not present

## 2022-06-30 DIAGNOSIS — Z170 Estrogen receptor positive status [ER+]: Secondary | ICD-10-CM | POA: Diagnosis not present

## 2022-06-30 DIAGNOSIS — M533 Sacrococcygeal disorders, not elsewhere classified: Secondary | ICD-10-CM | POA: Diagnosis not present

## 2022-06-30 DIAGNOSIS — C50512 Malignant neoplasm of lower-outer quadrant of left female breast: Secondary | ICD-10-CM | POA: Diagnosis not present

## 2022-07-03 ENCOUNTER — Telehealth: Payer: Self-pay | Admitting: *Deleted

## 2022-07-03 NOTE — Telephone Encounter (Signed)
This RN spoke with pt per her call to discuss side effects relating to the anastrozole.  She states she does note since starting the medication that she has increased discomfort in her lower back.  This RN discussed above with goal of side effects to be tolerable and not interfere with her ADL's. Pt stated overall she has mild increase in arthralgic discomfort but not enough that she wants to change medication or be prescribed anything.  This RN discussed benefit of stretches including " wall stretches " which give support and is not making her get up and down from the floor.  Pt stated appreciation of discussion and care noted by her providers. No further needs at this time.

## 2022-08-16 ENCOUNTER — Ambulatory Visit
Admission: RE | Admit: 2022-08-16 | Discharge: 2022-08-16 | Disposition: A | Discharge status: Home / Self Care | Payer: Medicare HMO | Source: Ambulatory Visit | Attending: Physician Assistant | Admitting: Physician Assistant

## 2022-08-16 VITALS — BP 163/80 | HR 88 | Temp 98.3°F | Resp 18

## 2022-08-16 DIAGNOSIS — J040 Acute laryngitis: Secondary | ICD-10-CM

## 2022-08-16 DIAGNOSIS — J069 Acute upper respiratory infection, unspecified: Secondary | ICD-10-CM

## 2022-08-16 MED ORDER — AZITHROMYCIN 250 MG PO TABS: 250.0000 mg | ORAL_TABLET | Freq: Every day | ORAL | 0 refills | Status: DC

## 2022-08-16 MED ORDER — PREDNISONE 20 MG PO TABS: 40.0000 mg | ORAL_TABLET | Freq: Every day | ORAL | 0 refills | Status: AC

## 2022-08-16 NOTE — ED Provider Notes (Signed)
[facility]    CSN: [id number] Arrival date & time: 08/16/22  0809      History   Chief Complaint Chief Complaint  Patient presents with   Cough    possible sinus infection - sneezing, coughing. - Entered by patient   Hoarse    HPI Kendra Thompson is a 71 y.o. female.   Patient here today for evaluation of sneezing, coughing, congestion that started about 4 days ago.  She states she woke this morning and did not have a voice.  She notes she did have sore throat on initial day of symptoms however this has improved significantly.  She does have asthma at baseline and has noted some mild wheezing but overall has not felt bad until the last few days.  She has not had any fever.  She has not been around anyone that has been sick.  She denies any nausea, vomiting or diarrhea.  She has been taking multiple over-the-counter medications without resolution of symptoms.  The history is provided by the patient.  Cough Associated symptoms: sore throat (resolved) and wheezing   Associated symptoms: no chills, no ear pain, no eye discharge, no fever and no shortness of breath     Past Medical History:  Diagnosis Date   Allergy 1971   Arthritis 2017   Asthma 1971   has not been present for several years   Breast cancer ([place]) 04/18/2021   Cataract 2020   Family history of breast cancer 04/30/2021   GERD (gastroesophageal reflux disease)    Headache    Migraines occasionally   Hypertension    Seasonal allergies    Wears glasses     Patient Active Problem List   Diagnosis Date Noted   Genetic testing 05/08/2021   Family history of breast cancer 04/30/2021   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   Onychomycosis of toenail 03/27/2021   Postmenopausal 12/29/2019   Gastroesophageal reflux disease without esophagitis 12/29/2019   Degenerative arthritis of thumb 10/01/2017   Chronic pain of both knees 09/11/2016   Obesity 08/03/2015   History of colon  polyps 06/20/2012   Encounter for routine gynecological examination 05/24/2012   Well adult exam 08/12/2011   ASTHMA, INTERMITTENT, MILD 08/01/2010   HYPERGLYCEMIA, BORDERLINE 06/14/2008   DE QUERVAIN'S TENOSYNOVITIS, LEFT WRIST 07/18/2007   Essential hypertension 06/16/2007    Past Surgical History:  Procedure Laterality Date   BREAST LUMPECTOMY     BREAST LUMPECTOMY WITH RADIOACTIVE SEED LOCALIZATION Left 05/20/2021   Procedure: LEFT BREAST BRACKETED LUMPECTOMY WITH RADIOACTIVE SEED LOCALIZATION x3;  Surgeon: Stark Klein, MD;  Location: [place];  Service: General;  Laterality: Left;   CLOSED REDUCTION FINGER WITH PERCUTANEOUS PINNING Left 07/25/2013   Procedure: CLOSED REDUCTION FINGER WITH PERCUTANEOUS PINNING LEFT SMALL METACARPAL;  Surgeon: Tennis Must, MD;  Location: [place];  Service: Orthopedics;  Laterality: Left;   COLONOSCOPY  2013,2014   DIAGNOSTIC LAPAROSCOPY  1992   explor-   DILATION AND CURETTAGE OF UTERUS     FRACTURE SURGERY  2014   HAND SURGERY  02/2020    OB History   No obstetric history on file.      Home Medications    Prior to Admission medications   Medication Sig Start Date End Date Taking? Authorizing Provider  azithromycin (ZITHROMAX) 250 MG tablet Take 1 tablet (250 mg total) by mouth daily. Take first 2 tablets together, then 1 every day until finished. 08/16/22  Yes Doyle Askew,  Sallee Lange, PA-C  predniSONE (DELTASONE) 20 MG tablet Take 2 tablets (40 mg total) by mouth daily with breakfast for 5 days. 08/16/22 08/21/22 Yes Francene Finders, PA-C  amLODipine (NORVASC) 10 MG tablet TAKE 1 TABLET BY MOUTH EVERY DAY 11/25/21   [name], MD  amoxicillin-clavulanate (AUGMENTIN) 875-125 MG tablet Take 1 tablet by mouth every 12 (twelve) hours. 01/14/22   Francene Finders, PA-C  anastrozole (ARIMIDEX) 1 MG tablet Take 1 tablet (1 mg total) by mouth daily. Start 08/26/2021 07/29/21   Magrinat, Virgie Dad, MD  Candesartan Cilexetil-HCTZ 32-25 MG  TABS TAKE 1 TABLET BY MOUTH EVERY DAY 03/16/22   [name], MD  fexofenadine (ALLEGRA) 180 MG tablet Take 180 mg by mouth daily as needed for allergies or rhinitis.    [provider]  gabapentin (NEURONTIN) 100 MG capsule Take by mouth one in AM and one midday in addition to other dose of 300 mg at night for max 500 mg daily 04/08/22   [name], MD  gabapentin (NEURONTIN) 300 MG capsule Take 1 capsule (300 mg total) by mouth at bedtime. 09/30/21   Magrinat, Virgie Dad, MD  ibuprofen (ADVIL) 200 MG tablet Take 200-600 mg by mouth every 6 (six) hours as needed for headache or moderate pain.    [provider]  MULTIPLE VITAMIN PO Take 1 tablet by mouth daily.    [provider]  Olopatadine HCl (PATADAY OP) Place 1 drop into both eyes daily as needed (allergies).    [provider]  pantoprazole (PROTONIX) 40 MG tablet TAKE 1 TABLET BY MOUTH EVERY DAY 11/03/21   [name], MD  promethazine-dextromethorphan (PROMETHAZINE-DM) 6.25-15 MG/5ML syrup Take 5 mLs by mouth 4 (four) times daily as needed for cough. 01/14/22   Francene Finders, PA-C    Family History Family History  Problem Relation Age of Onset   Breast cancer Mother 89   Arthritis Mother    Cancer Mother    Depression Mother    Heart disease Mother    Hypertension Mother    CAD Father        Diagnosed in early 87s   Arthritis Father    Hearing loss Father    Heart disease Father    Esophageal cancer Brother 33   Other Brother 10       neuroendocrine tumor   Alcohol abuse Brother    Breast cancer Maternal Grandmother        dx after 64   Diabetes Maternal Grandmother    Heart disease Maternal Grandmother    Hypertension Maternal Grandmother    Colon cancer Neg Hx     Social History Social History   Tobacco Use   Smoking status: Never   Smokeless tobacco: Never  Vaping Use   Vaping Use: Never used  Substance Use Topics   Alcohol use: Not Currently    Comment: very rarely    Drug use: Never     Allergies   Latex and Tape   Review of Systems Review of Systems  Constitutional:  Negative for chills and fever.  HENT:  Positive for congestion, sore throat (resolved) and voice change. Negative for ear pain.   Eyes:  Negative for discharge and redness.  Respiratory:  Positive for cough and wheezing. Negative for shortness of breath.   Gastrointestinal:  Negative for abdominal pain, diarrhea, nausea and vomiting.     Physical Exam Triage Vital Signs ED Triage Vitals  Enc Vitals Group  BP      Pulse      Resp      Temp      Temp src      SpO2      Weight      Height      Head Circumference      Peak Flow      Pain Score      Pain Loc      Pain Edu?      Excl. in [place]?    No data found.  Updated Vital Signs BP (!) 163/80 (BP Location: Right Arm)   Pulse 88   Temp 98.3 F (36.8 C) (Oral)   Resp 18   SpO2 96%      Physical Exam Vitals and nursing note reviewed.  Constitutional:      General: She is not in acute distress.    Appearance: Normal appearance. She is not ill-appearing.  HENT:     Head: Normocephalic and atraumatic.     Right Ear: Tympanic membrane normal.     Left Ear: Tympanic membrane normal.     Nose: Congestion present.     Mouth/Throat:     Mouth: Mucous membranes are moist.     Pharynx: Posterior oropharyngeal erythema present. No oropharyngeal exudate.     Comments: Hoarse voice Eyes:     Conjunctiva/sclera: Conjunctivae normal.  Cardiovascular:     Rate and Rhythm: Normal rate and regular rhythm.     Heart sounds: Normal heart sounds. No murmur heard. Pulmonary:     Effort: Pulmonary effort is normal. No respiratory distress.     Breath sounds: Normal breath sounds. No wheezing, rhonchi or rales.  Skin:    General: Skin is warm and dry.  Neurological:     Mental Status: She is alert.  Psychiatric:        Mood and Affect: Mood normal.        Thought Content: Thought content normal.      UC Treatments  / Results  Labs (all labs ordered are listed, but only abnormal results are displayed) Labs Reviewed - No data to display  EKG   Radiology No results found.  Procedures Procedures (including critical care time)  Medications Ordered in UC Medications - No data to display  Initial Impression / Assessment and Plan / UC Course  I have reviewed the triage vital signs and the nursing notes.  Pertinent labs & imaging results that were available during my care of the patient were reviewed by me and considered in my medical decision making (see chart for details).    We will treat to cover possible impending asthma exacerbation with steroids and Z-Pak.  Steroid likely also will be effective in laryngitis treatment.  Encouraged follow-up if no gradual improvement or with any further concerns.  Recommended avoiding ibuprofen and Aleve while taking prednisone.  Patient expresses understanding.  Final Clinical Impressions(s) / UC Diagnoses   Final diagnoses:  Laryngitis  Acute upper respiratory infection   Discharge Instructions   None    ED Prescriptions     Medication Sig Dispense Auth. Provider   predniSONE (DELTASONE) 20 MG tablet Take 2 tablets (40 mg total) by mouth daily with breakfast for 5 days. 10 tablet Ewell Poe F, PA-C   azithromycin (ZITHROMAX) 250 MG tablet Take 1 tablet (250 mg total) by mouth daily. Take first 2 tablets together, then 1 every day until finished. 6 tablet Francene Finders, PA-C  PDMP not reviewed this encounter.   Francene Finders, PA-C 08/16/22 [phone]

## 2022-08-16 NOTE — ED Triage Notes (Addendum)
The patient states she lost her voice last night, and coughing, sneezing, and left ear fullness that started Thursday.   Home interventions: OTC medications

## 2022-08-21 ENCOUNTER — Other Ambulatory Visit: Payer: Self-pay | Admitting: *Deleted

## 2022-08-21 ENCOUNTER — Encounter: Payer: Self-pay | Admitting: Hematology and Oncology

## 2022-08-21 MED ORDER — ANASTROZOLE 1 MG PO TABS: 1.0000 mg | ORAL_TABLET | Freq: Every day | ORAL | 4 refills | Status: DC

## 2022-09-17 IMAGING — MR MR BREAST BX W LOC DEV 1ST LESION IMAGE BX SPEC MR GUIDE*L*
7 of 10 series · 33 of 48 positions shown · IV contrast (10 ml gadavist)
Comparison: Previous exams.
COMPARISON: Previous exams.

Addendum:
CLINICAL DATA: Suspicious linear non masslike enhancement in the
lateral retroareolar region of the left breast.

EXAM:
MRI GUIDED CORE NEEDLE BIOPSY OF THE LEFT BREAST
TECHNIQUE: Multiplanar, multisequence MR imaging of the left breast was
performed both before and after administration of intravenous
contrast.
CONTRAST:  10mL GADAVIST GADOBUTROL 1 MMOL/ML IV SOLN

[Series 2: fiducial unilateral · sagittal · 2.0mm · 1.33mm/px · 3 of 52 slices shown]
[im 1/52]
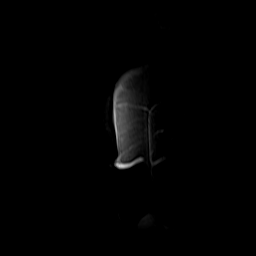
[im 26/52]
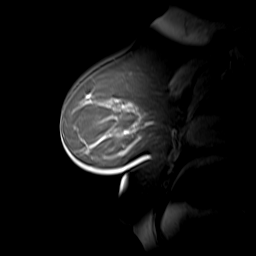
[im 52/52]
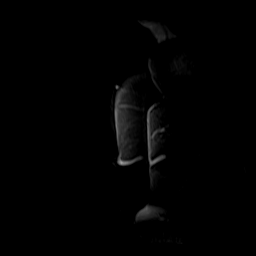

[Series 3: dynamic pre · axial · non-contrast · 1.3mm · 0.73mm/px · z∈[-72,+114]mm · 5 of 144 slices shown]
[im 1/144]
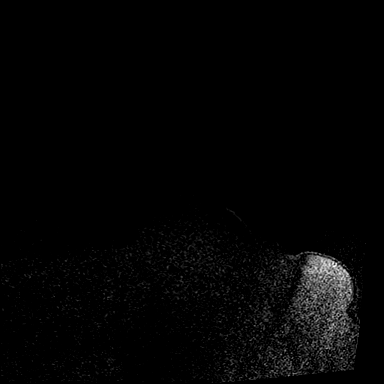
[im 36/144]
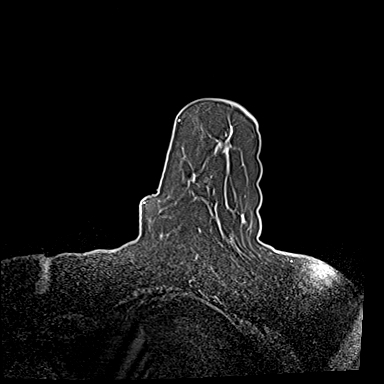
[im 72/144]
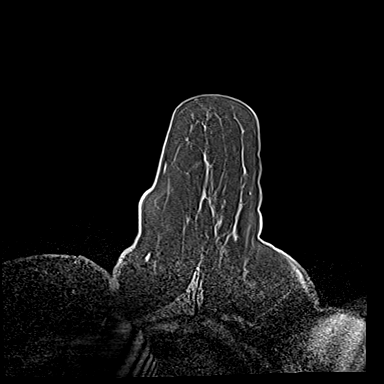
[im 108/144]
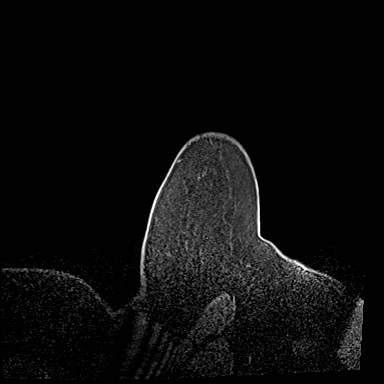
[im 144/144]
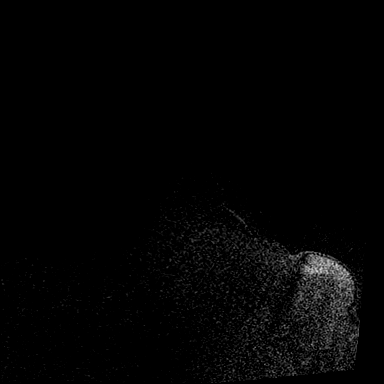

[Series 4: dynamic post 20 · axial · 1.3mm · 0.73mm/px · z∈[-72,+114]mm · 5 of 144 slices shown (1 of 2)]
[im 1/144]
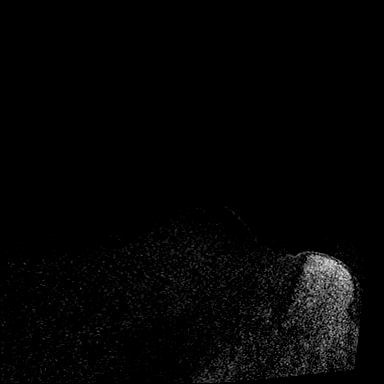
[im 36/144]
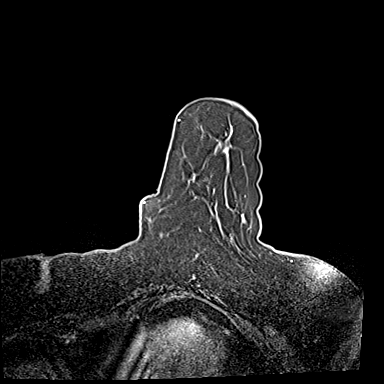
[im 72/144]
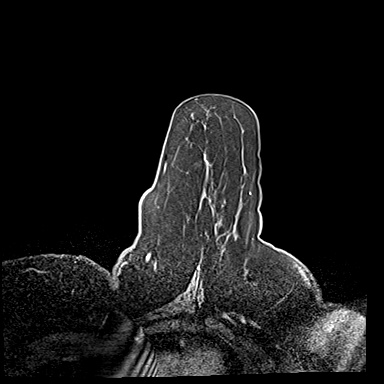
[im 108/144]
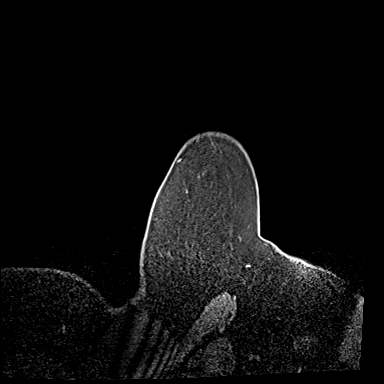
[im 144/144]
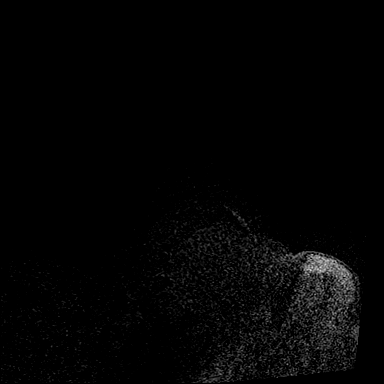

[Series 5: dynamic post 20 · axial · 1.3mm · 0.73mm/px · z∈[-72,+114]mm · 5 of 144 slices shown (2 of 2)]
[im 1/144]
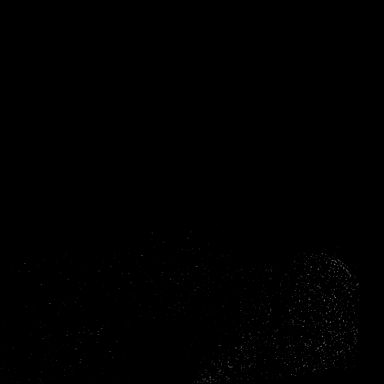
[im 36/144]
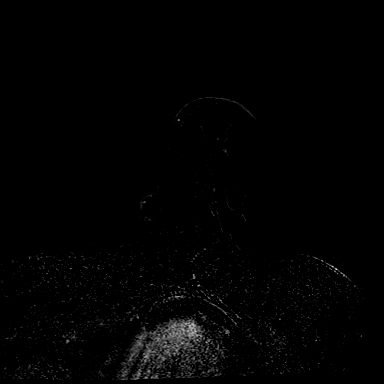
[im 72/144]
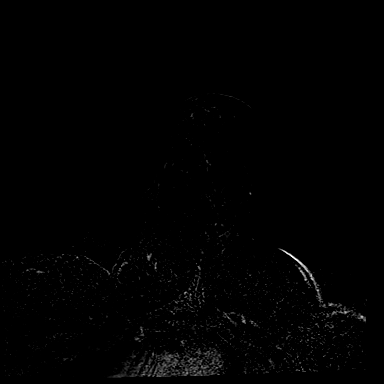
[im 108/144]
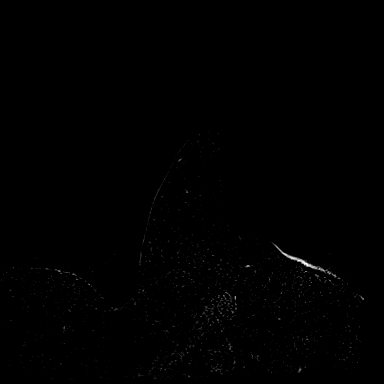
[im 144/144]
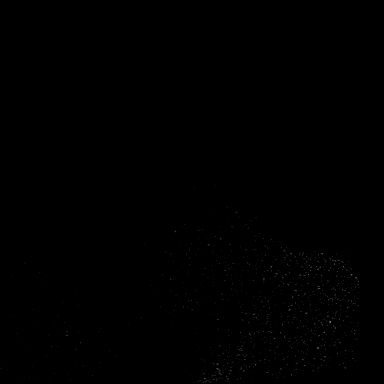

[Series 6: needle confirmation · axial · 1.3mm · 0.73mm/px · z∈[-72,+114]mm · 5 of 144 slices shown]
[im 1/144]
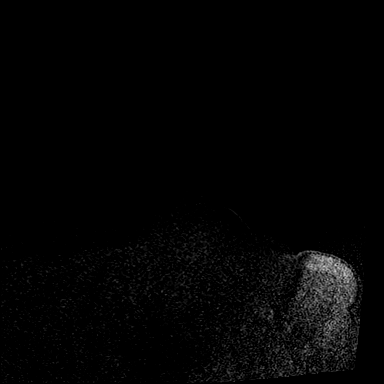
[im 36/144]
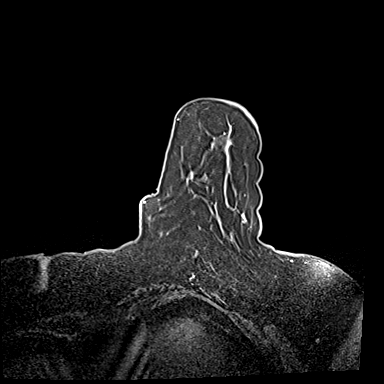
[im 72/144]
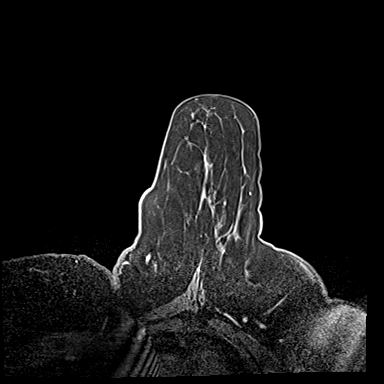
[im 108/144]
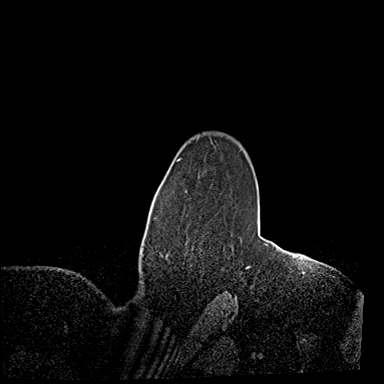
[im 144/144]
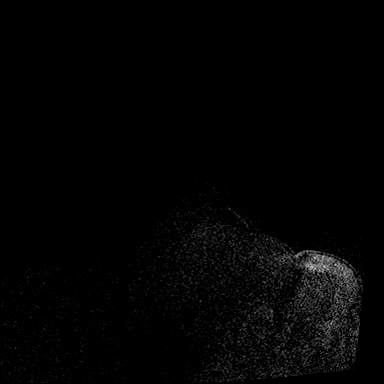

[Series 7: needle confirmation_sub · axial · 1.3mm · 0.73mm/px · z∈[-72,+114]mm · 5 of 144 slices shown]
[im 1/144]
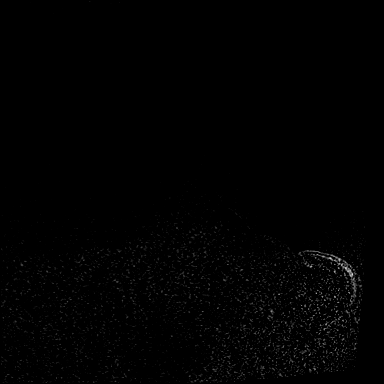
[im 36/144]
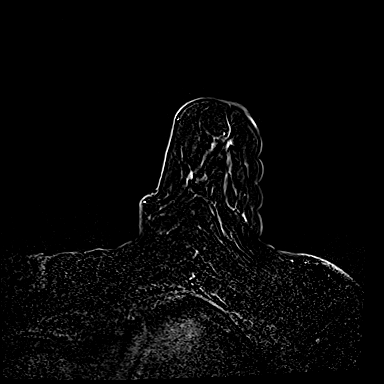
[im 72/144]
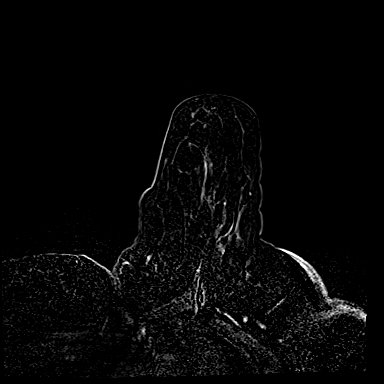
[im 108/144]
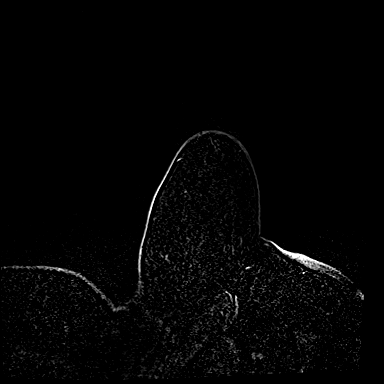
[im 144/144]
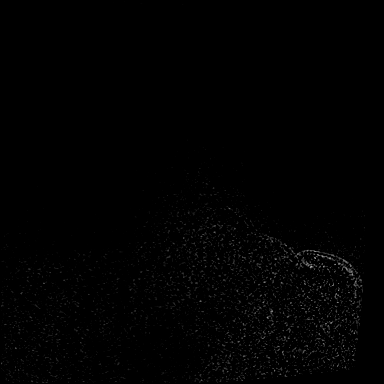

[Series 8: post bx · axial · 1.3mm · 0.73mm/px · z∈[-72,+114]mm · 5 of 144 slices shown]
[im 1/144]
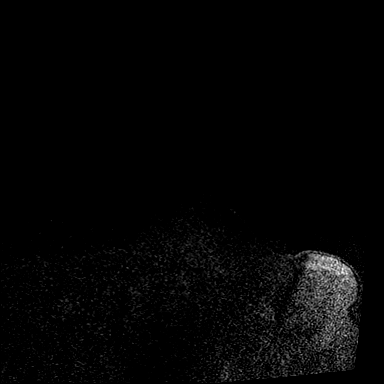
[im 36/144]
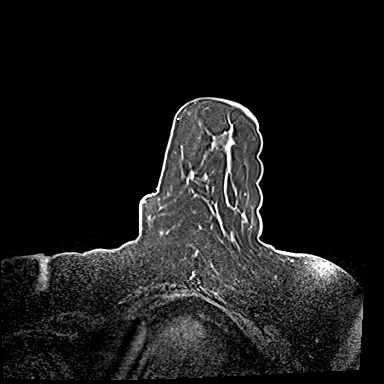
[im 72/144]
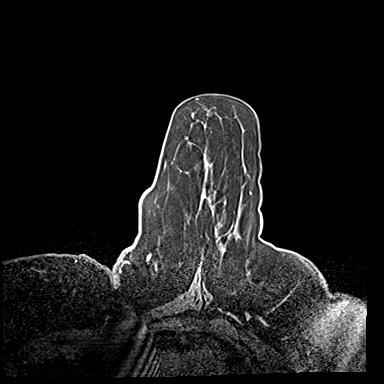
[im 108/144]
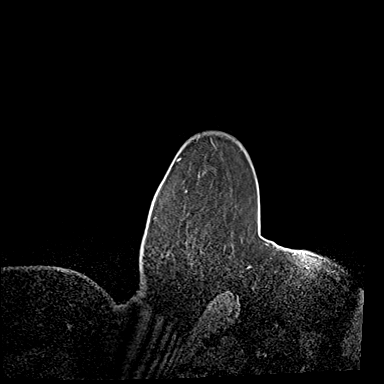
[im 144/144]
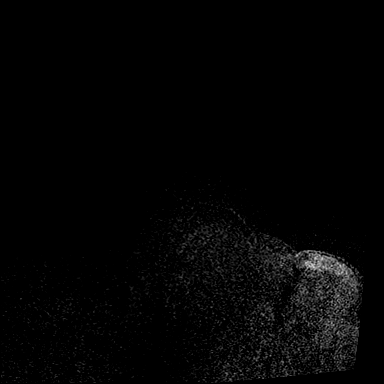

[33 of 48 positions shown; findings below may reference images not displayed]

FINDINGS: I met with the patient, and we discussed the procedure of MRI guided
biopsy, including risks, benefits, and alternatives. Specifically,
we discussed the risks of infection, bleeding, tissue injury, clip
migration, and inadequate sampling. Informed, written consent was
given. The usual time out protocol was performed immediately prior
to the procedure.

Using sterile technique, 1% Lidocaine, MRI guidance, and a 9 gauge
vacuum assisted device, biopsy was performed of enhancement in the
lateral aspect of the left breast using a lateral to medial
approach. At the conclusion of the procedure, a dumbbell-shaped
tissue marker clip was deployed into the biopsy cavity. Follow-up
2-view mammogram was performed and dictated separately.
IMPRESSION: MRI guided biopsy of the left breast.  No apparent complications.

ADDENDUM:
Pathology revealed LOBULAR NEOPLASIA (ATYPICAL LOBULAR HYPERPLASIA)
of the Left breast, lateral, (dumbbell clip). This was found to be
concordant by Dr. Nirav Hudson, with excision recommended.

Pathology results were discussed with the patient by telephone. The
patient reported doing well after the biopsy with tenderness at the
site. Post biopsy instructions and care were reviewed and questions
were answered. The patient was encouraged to call The [REDACTED] for any additional concerns. My direct phone
number was provided.

The patient has a recent diagnosis of Left breast cancer and should
follow her outlined treatment plan. Dr. Berting Hampac was notified of
biopsy results via [REDACTED] message on May 14, 2021.

Pathology results reported by Rabin Popoca, RN on 05/15/2021.

*** End of Addendum ***
FINDINGS: I met with the patient, and we discussed the procedure of MRI guided
biopsy, including risks, benefits, and alternatives. Specifically,
we discussed the risks of infection, bleeding, tissue injury, clip
migration, and inadequate sampling. Informed, written consent was
given. The usual time out protocol was performed immediately prior
to the procedure.

Using sterile technique, 1% Lidocaine, MRI guidance, and a 9 gauge
vacuum assisted device, biopsy was performed of enhancement in the
lateral aspect of the left breast using a lateral to medial
approach. At the conclusion of the procedure, a dumbbell-shaped
tissue marker clip was deployed into the biopsy cavity. Follow-up
2-view mammogram was performed and dictated separately.
IMPRESSION: MRI guided biopsy of the left breast.  No apparent complications.

## 2022-09-17 IMAGING — MG MM BREAST LOCALIZATION CLIP
4 series · 4 of 12 positions shown · non-contrast
Comparison: Previous exam(s).

CLINICAL DATA: Status post MR guided core biopsy of enhancement in
the left breast.

EXAM:
3D DIAGNOSTIC LEFT MAMMOGRAM POST MRI BIOPSY

[L ML synth-2D]
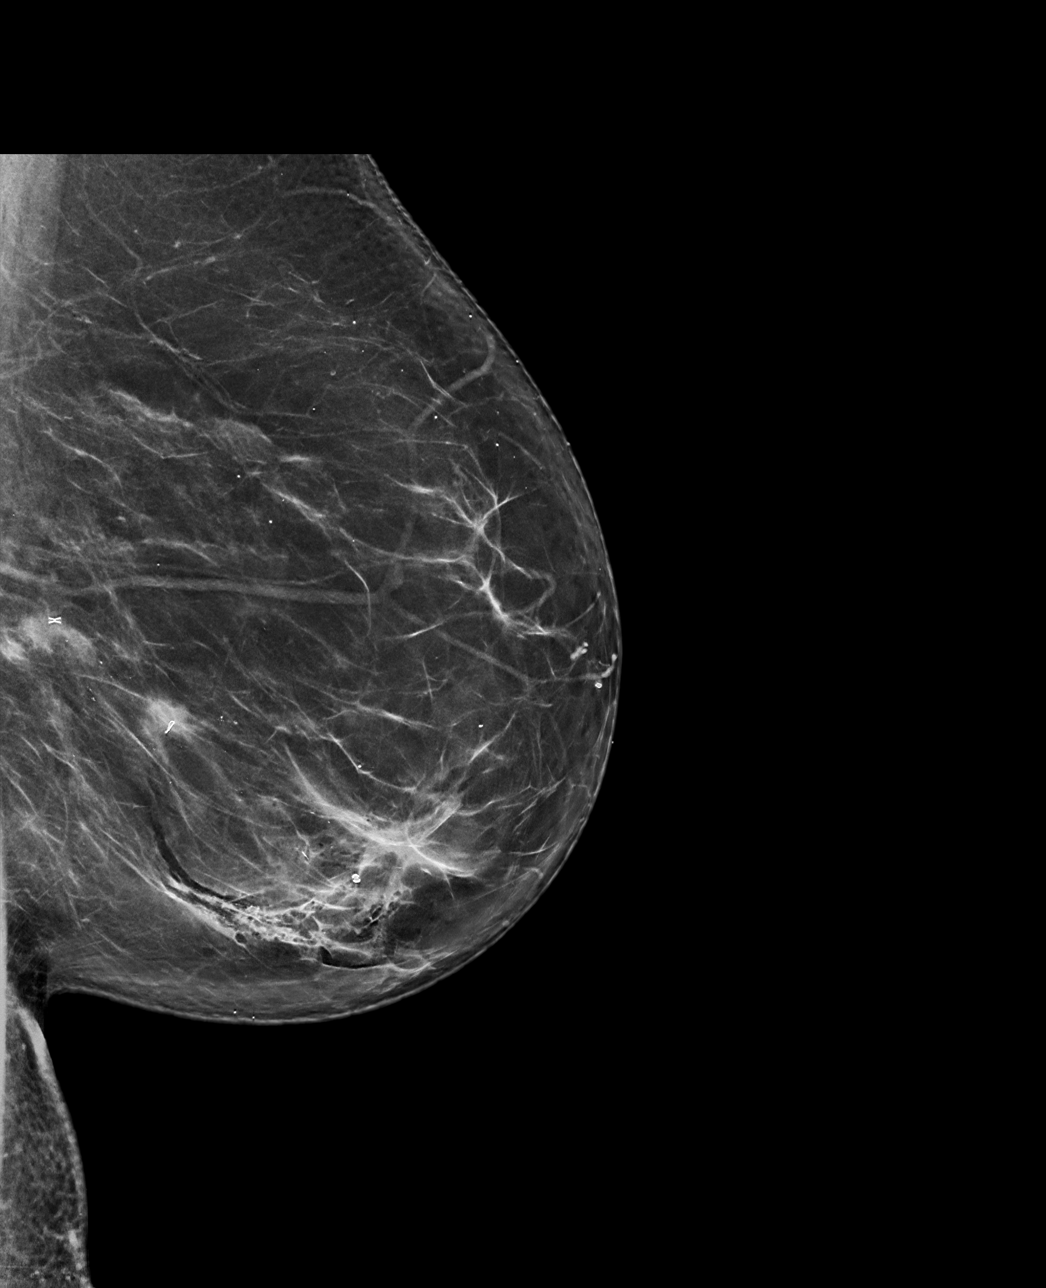

[L CC synth-2D]
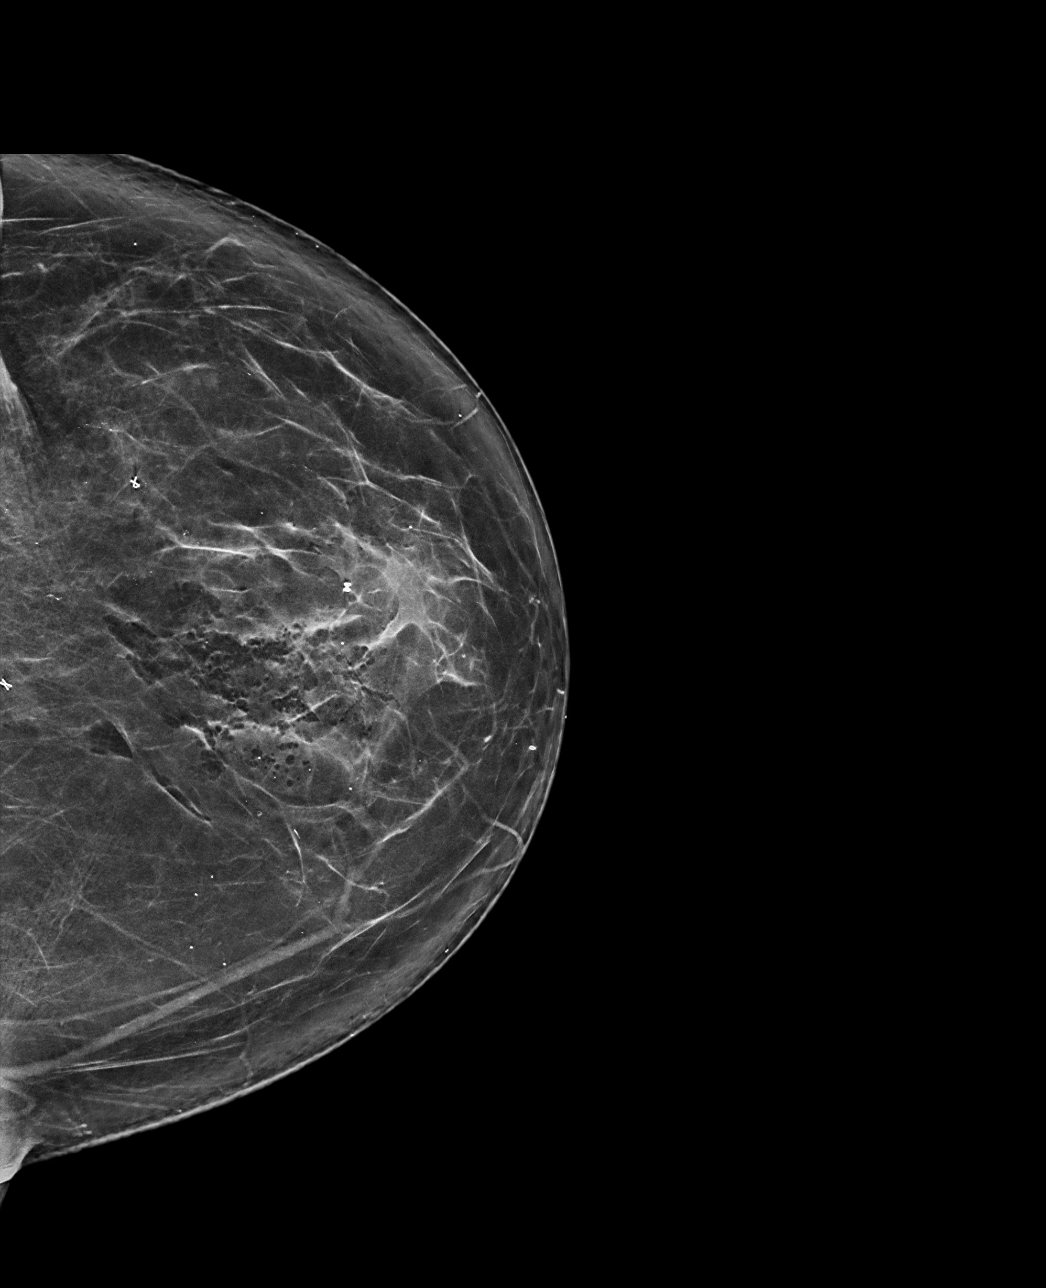

[L ML tomo · tomo slice 43/86.0]
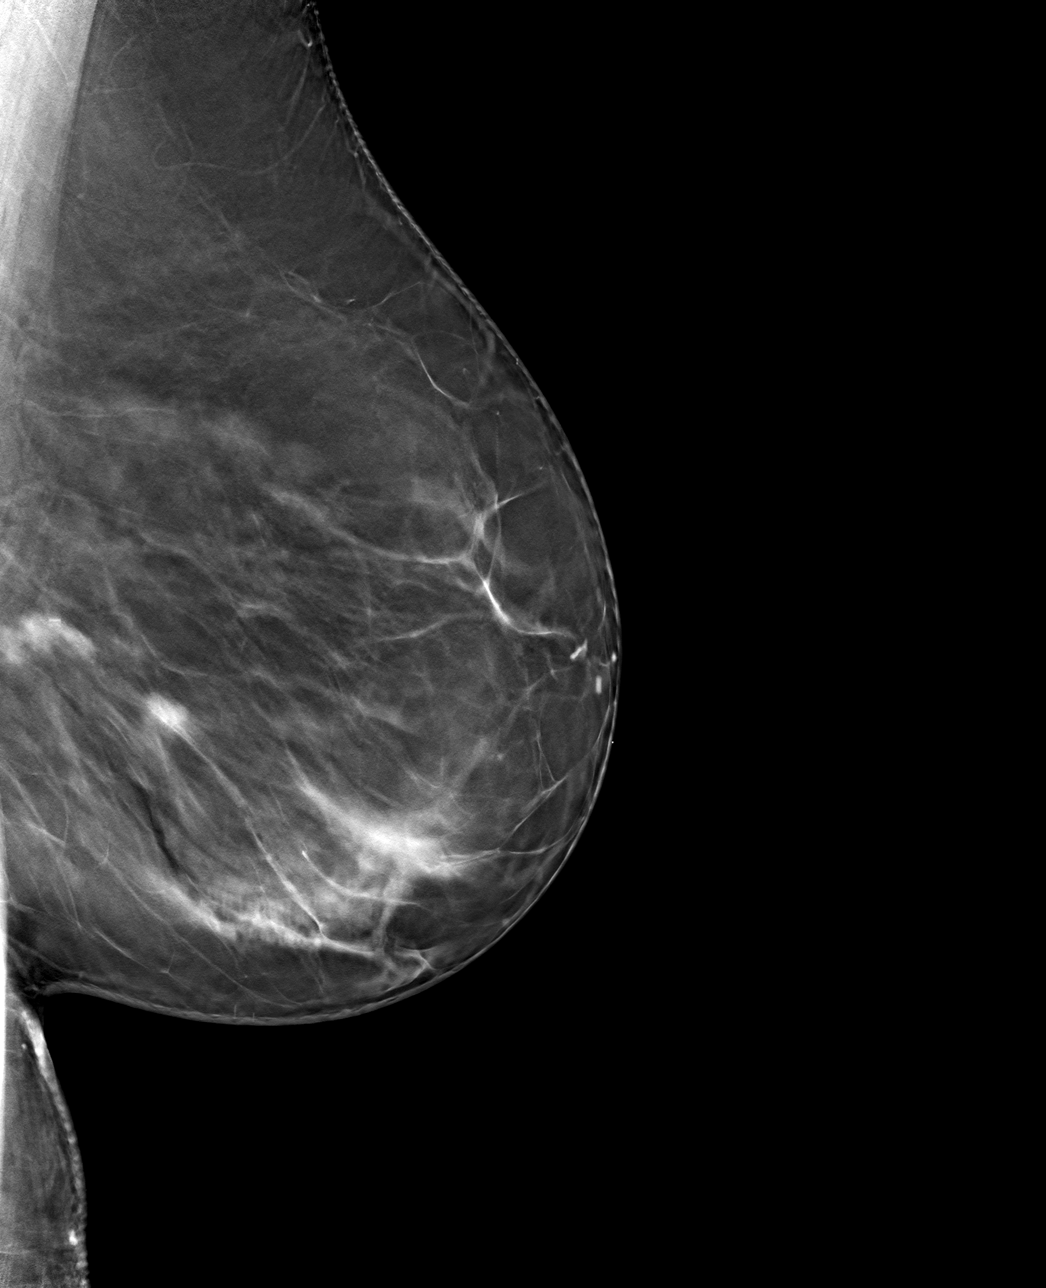

[L CC tomo · tomo slice 41/81.0]
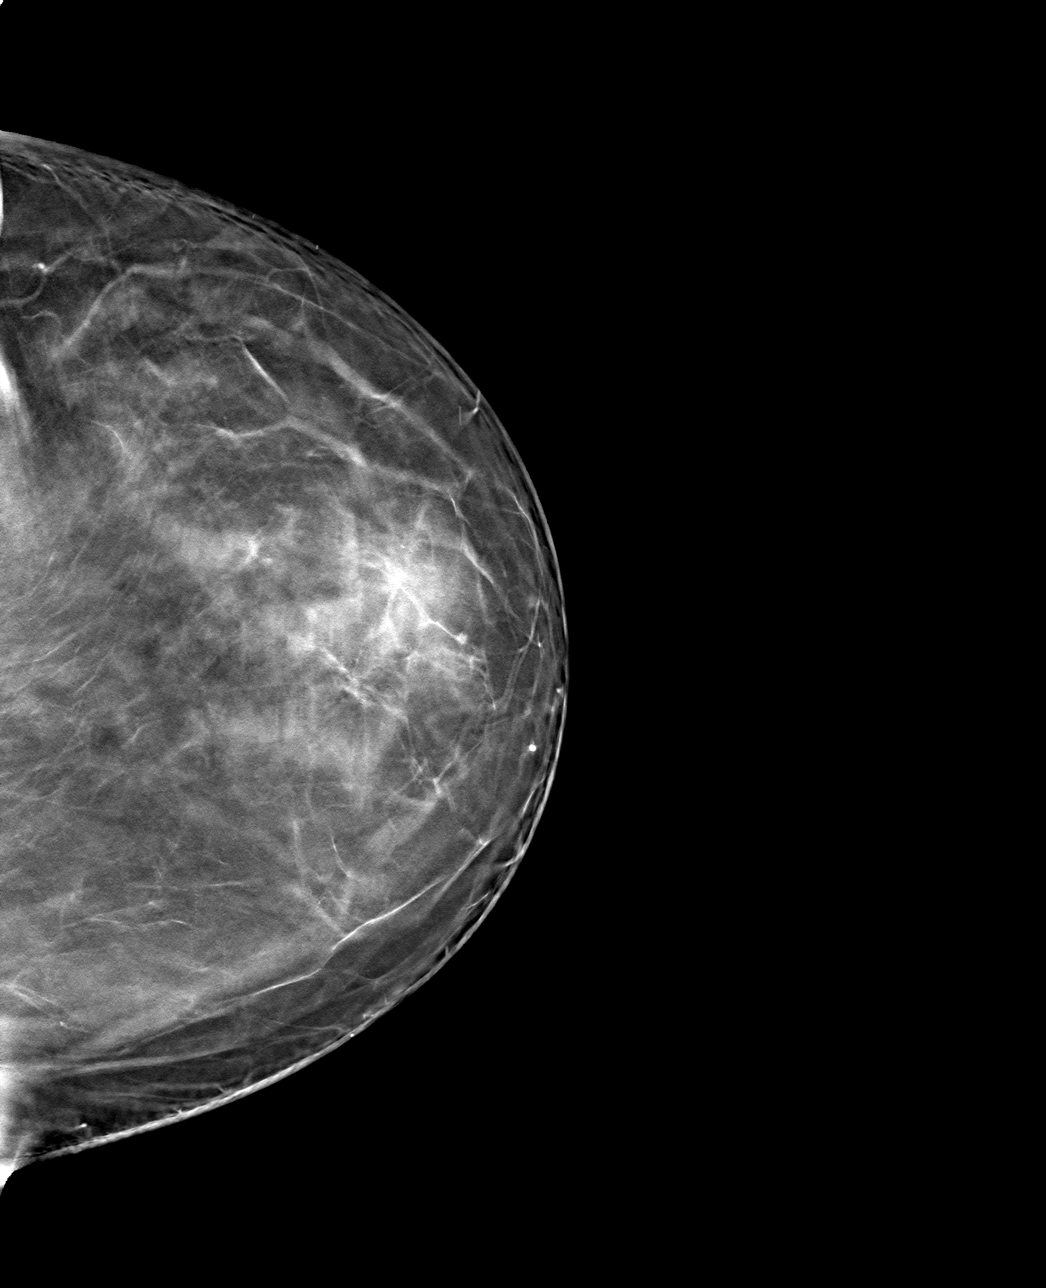

[4 of 12 positions shown; findings below may reference images not displayed]

FINDINGS: 3D Mammographic images were obtained following MRI guided biopsy of
enhancement in the left breast. The biopsy marking clip is in
expected location in the lateral aspect of the left breast.
IMPRESSION: Appropriate positioning of the dumbbell shaped biopsy marking clip
at the site of biopsy in the lateral aspect of the left breast.

Final Assessment: Post Procedure Mammograms for Marker Placement

## 2022-09-23 ENCOUNTER — Encounter: Payer: Self-pay | Admitting: Gastroenterology

## 2022-09-25 IMAGING — MG MM BREAST SURGICAL SPECIMEN
1 series · 1 of 1 positions shown · non-contrast
Comparison: Previous exam(s).

CLINICAL DATA: 69-year-old with biopsy-proven DCIS involving the
LOWER OUTER QUADRANT of the LEFT breast and biopsy-proven ALH
involving the subareolar location.

EXAM:
SPECIMEN RADIOGRAPH OF THE LEFT BREAST

[L]
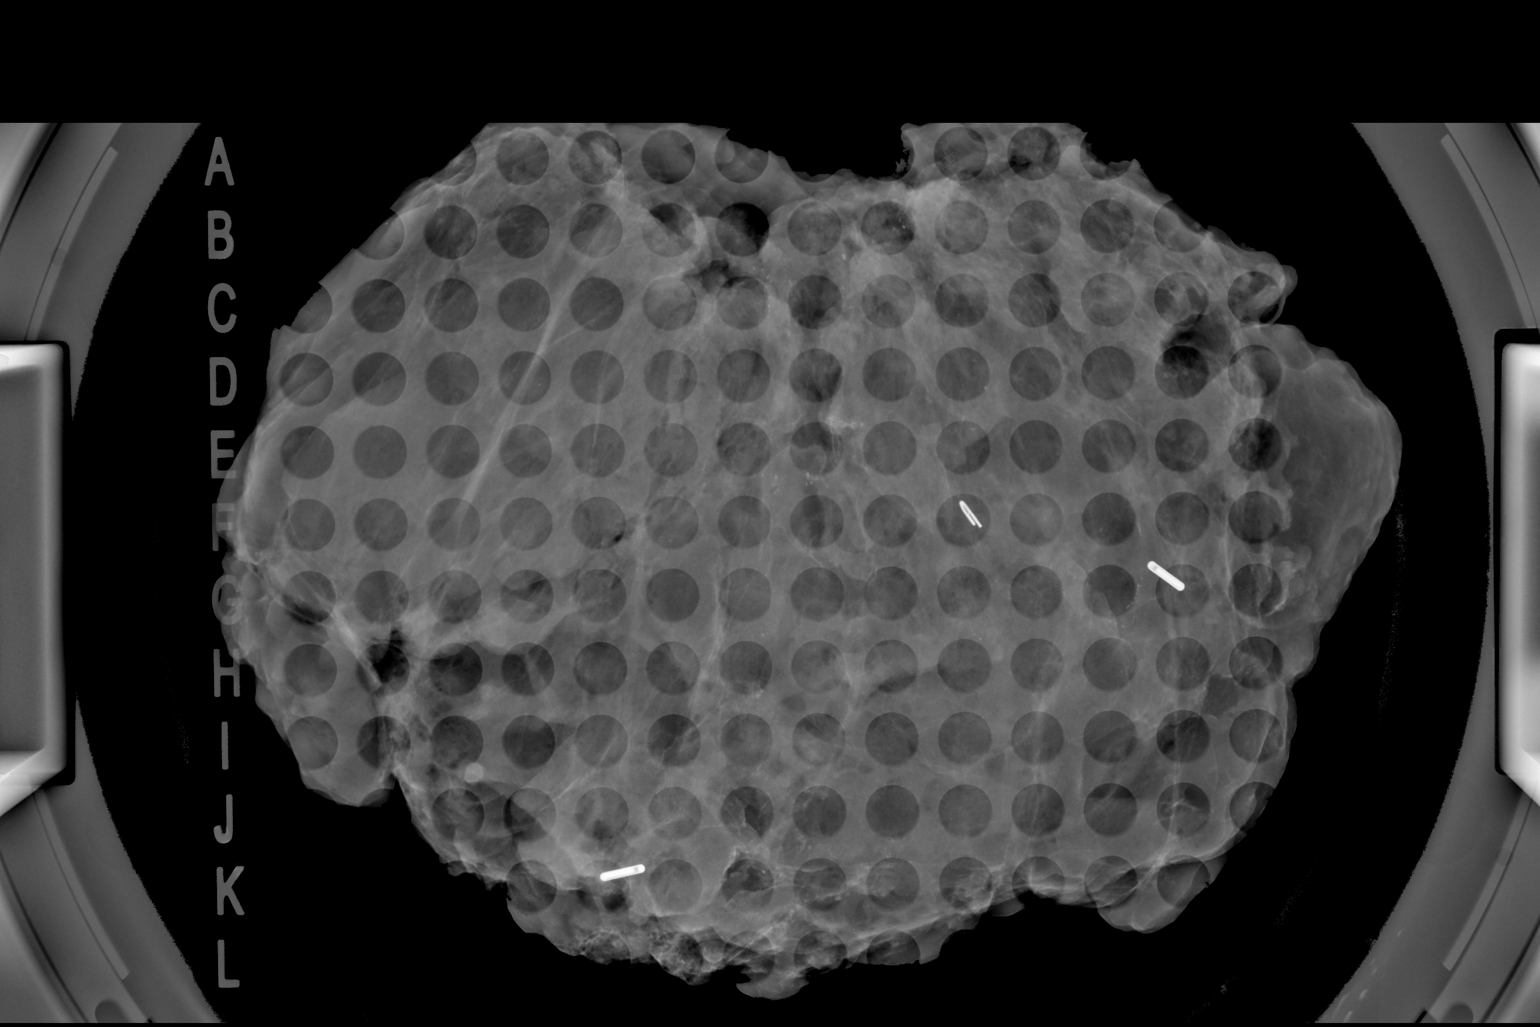

[1 of 1 positions shown; findings below may reference images not displayed]

FINDINGS: Status post excision of the LEFT breast. Two of the radioactive
seeds and the ribbon shaped tissue marker clip are present in the
specimen, completely intact, and are marked for pathology. This was
discussed with the operating room nurse at the time of
interpretation on 05/20/2021 at [DATE] p.m.
IMPRESSION: Specimen radiograph of the LEFT breast.

## 2022-10-24 ENCOUNTER — Encounter: Payer: Self-pay | Admitting: Hematology and Oncology

## 2022-10-27 ENCOUNTER — Other Ambulatory Visit: Payer: Self-pay | Admitting: *Deleted

## 2022-10-27 MED ORDER — GABAPENTIN 300 MG PO CAPS: 300.0000 mg | ORAL_CAPSULE | Freq: Every day | ORAL | 4 refills | Status: DC

## 2022-11-01 ENCOUNTER — Ambulatory Visit
Admission: RE | Admit: 2022-11-01 | Discharge: 2022-11-01 | Disposition: A | Discharge status: Home / Self Care | Payer: Medicare HMO | Source: Ambulatory Visit | Attending: Nurse Practitioner | Admitting: Nurse Practitioner

## 2022-11-01 VITALS — BP 162/86 | HR 70 | Temp 98.0°F | Resp 17

## 2022-11-01 DIAGNOSIS — N3001 Acute cystitis with hematuria: Secondary | ICD-10-CM | POA: Insufficient documentation

## 2022-11-01 DIAGNOSIS — R300 Dysuria: Secondary | ICD-10-CM | POA: Insufficient documentation

## 2022-11-01 LAB — POCT URINALYSIS DIP (MANUAL ENTRY)
Bilirubin, UA: NEGATIVE
Glucose, UA: NEGATIVE mg/dL
Ketones, POC UA: NEGATIVE mg/dL
Nitrite, UA: NEGATIVE
Spec Grav, UA: 1.02 (ref 1.010–1.025)
Urobilinogen, UA: 0.2 E.U./dL
pH, UA: 7 (ref 5.0–8.0)

## 2022-11-01 MED ORDER — CEPHALEXIN 500 MG PO CAPS: 500.0000 mg | ORAL_CAPSULE | Freq: Two times a day (BID) | ORAL | 0 refills | Status: AC

## 2022-11-01 NOTE — Discharge Instructions (Signed)
Keflex twice daily for 7 days Rest and fluids The clinic will contact you if your treatment needs to be altered based on your urine culture Follow-up with your PCP 2 to 3 days for recheck Please go to the ER if you have any worsening symptoms

## 2022-11-01 NOTE — ED Triage Notes (Signed)
Pt presents with burning after urination and urinary frequency & urgency X 2 days.

## 2022-11-01 NOTE — ED Provider Notes (Signed)
[facility]    CSN: [id number] Arrival date & time: 11/01/22  1023      History   Chief Complaint Chief Complaint  Patient presents with   Urinary Retention    possible UTI - can't pee much, slight burning sensation.  drinking lots of fluids but not much result. - Entered by patient    HPI Kendra Thompson is a 72 y.o. female who presents for evaluation of dysuria for 3 days. Patient reports associated symptoms of urinary burning, urgency, and frequency. Patient reports a history of UTI's but denies history of pyelonephritis. Patient denies fever, chills, nausea, vomiting, abdominal pain, flank pain, low back pain. No vaginal discharge or STD concern. Patient has taken nothing for symptoms. No other c/o today.   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   Onychomycosis of toenail 03/27/2021   Postmenopausal 12/29/2019   Gastroesophageal reflux disease without esophagitis 12/29/2019   Degenerative arthritis of thumb 10/01/2017   Chronic pain of both knees 09/11/2016   Obesity 08/03/2015   History of colon polyps 06/20/2012   Encounter for routine gynecological examination 05/24/2012   Well adult exam 08/12/2011   ASTHMA, INTERMITTENT, MILD 08/01/2010   HYPERGLYCEMIA, BORDERLINE 06/14/2008   DE QUERVAIN'S TENOSYNOVITIS, LEFT WRIST 07/18/2007   Essential hypertension 06/16/2007    Past Surgical History:  Procedure Laterality Date   BREAST LUMPECTOMY      BREAST LUMPECTOMY WITH RADIOACTIVE SEED LOCALIZATION Left 05/20/2021   Procedure: LEFT BREAST BRACKETED LUMPECTOMY WITH RADIOACTIVE SEED LOCALIZATION x3;  Surgeon: Stark Klein, MD;  Location: [place];  Service: General;  Laterality: Left;   CLOSED REDUCTION FINGER WITH PERCUTANEOUS PINNING Left 07/25/2013   Procedure: CLOSED REDUCTION FINGER WITH PERCUTANEOUS PINNING LEFT SMALL METACARPAL;  Surgeon: Tennis Must, MD;  Location: [place];  Service: Orthopedics;  Laterality: Left;   COLONOSCOPY  2013,2014   DIAGNOSTIC LAPAROSCOPY  1992   explor-   DILATION AND CURETTAGE OF UTERUS     FRACTURE SURGERY  2014   HAND SURGERY  02/2020    OB History   No obstetric history on file.      Home Medications    Prior to Admission medications   Medication Sig Start Date End Date Taking? Authorizing Provider  cephALEXin (KEFLEX) 500 MG capsule Take 1 capsule (500 mg total) by mouth 2 (two) times daily for 7 days. 11/01/22 11/08/22 Yes Melynda Ripple, NP  amLODipine (NORVASC) 10 MG tablet TAKE 1 TABLET BY MOUTH EVERY DAY 11/25/21   [name], MD  anastrozole (ARIMIDEX) 1 MG tablet Take 1 tablet (1 mg total) by mouth daily. Start 08/26/2021 08/21/22   [name], MD  Candesartan Cilexetil-HCTZ 32-25 MG TABS TAKE 1 TABLET BY MOUTH EVERY DAY 03/16/22   [name], MD  fexofenadine (ALLEGRA) 180 MG tablet Take 180 mg by mouth daily as needed for allergies  or rhinitis.    [provider]  gabapentin (NEURONTIN) 300 MG capsule Take 1 capsule (300 mg total) by mouth at bedtime. 10/27/22   [name], MD  ibuprofen (ADVIL) 200 MG tablet Take 200-600 mg by mouth every 6 (six) hours as needed for headache or moderate pain.    [provider]  MULTIPLE VITAMIN PO Take 1 tablet by mouth daily.    [provider]  Olopatadine HCl (PATADAY OP) Place 1 drop into both eyes daily as needed (allergies).    [provider]  pantoprazole (PROTONIX) 40 MG  tablet TAKE 1 TABLET BY MOUTH EVERY DAY 11/03/21   [name], MD  promethazine-dextromethorphan (PROMETHAZINE-DM) 6.25-15 MG/5ML syrup Take 5 mLs by mouth 4 (four) times daily as needed for cough. 01/14/22   Francene Finders, PA-C    Family History Family History  Problem Relation Age of Onset   Breast cancer Mother 102   Arthritis Mother    Cancer Mother    Depression Mother    Heart disease Mother    Hypertension Mother    CAD Father        Diagnosed in early 73s   Arthritis Father    Hearing loss Father    Heart disease Father    Esophageal cancer Brother 24   Other Brother 26       neuroendocrine tumor   Alcohol abuse Brother    Breast cancer Maternal Grandmother        dx after 76   Diabetes Maternal Grandmother    Heart disease Maternal Grandmother    Hypertension Maternal Grandmother    Colon cancer Neg Hx     Social History Social History   Tobacco Use   Smoking status: Never   Smokeless tobacco: Never  Vaping Use   Vaping Use: Never used  Substance Use Topics   Alcohol use: Not Currently    Comment: very rarely   Drug use: Never     Allergies   Latex and Tape   Review of Systems Review of Systems  Genitourinary:  Positive for dysuria.     Physical Exam Triage Vital Signs ED Triage Vitals  Enc Vitals Group     BP 11/01/22 1058 (!) 162/86     Pulse Rate 11/01/22 1058 70     Resp 11/01/22 1058 17     Temp 11/01/22 1058 98 F (36.7 C)     Temp Source 11/01/22 1058 Oral     SpO2 11/01/22 1058 94 %     Weight --      Height --      Head Circumference --      Peak Flow --      Pain Score 11/01/22 1100 4     Pain Loc --      Pain Edu? --      Excl. in [place]? --    No data found.  Updated Vital Signs BP (!) 162/86 (BP Location: Left Arm)   Pulse 70   Temp 98 F (36.7 C) (Oral)   Resp 17   SpO2 94%   Visual Acuity Right Eye Distance:   Left Eye Distance:   Bilateral Distance:    Right Eye Near:   Left Eye Near:    Bilateral  Near:     Physical Exam Vitals and nursing note reviewed.  Constitutional:      Appearance: Normal appearance.  HENT:     Head: Normocephalic and atraumatic.  Eyes:  Pupils: Pupils are equal, round, and reactive to light.  Cardiovascular:     Rate and Rhythm: Normal rate.  Pulmonary:     Effort: Pulmonary effort is normal.  Abdominal:     Tenderness: There is no right CVA tenderness or left CVA tenderness.  Skin:    General: Skin is warm and dry.  Neurological:     General: No focal deficit present.     Mental Status: She is alert and oriented to person, place, and time.  Psychiatric:        Mood and Affect: Mood normal.        Behavior: Behavior normal.      UC Treatments / Results  Labs (all labs ordered are listed, but only abnormal results are displayed) Labs Reviewed  POCT URINALYSIS DIP (MANUAL ENTRY) - Abnormal; Notable for the following components:      Result Value   Blood, UA moderate (*)    Protein Ur, POC trace (*)    Leukocytes, UA Large (3+) (*)    All other components within normal limits  URINE CULTURE    EKG   Radiology No results found.  Procedures Procedures (including critical care time)  Medications Ordered in UC Medications - No data to display  Initial Impression / Assessment and Plan / UC Course  I have reviewed the triage vital signs and the nursing notes.  Pertinent labs & imaging results that were available during my care of the patient were reviewed by me and considered in my medical decision making (see chart for details).     Start Keflex and send urine culture Rest and fluids Follow-up PCP 2 to 3 days for recheck ER precautions reviewed and patient verbalized understanding Final Clinical Impressions(s) / UC Diagnoses   Final diagnoses:  Acute cystitis with hematuria  Dysuria     Discharge Instructions      Keflex twice daily for 7 days Rest and fluids The clinic will contact you if your treatment needs to  be altered based on your urine culture Follow-up with your PCP 2 to 3 days for recheck Please go to the ER if you have any worsening symptoms    ED Prescriptions     Medication Sig Dispense Auth. Provider   cephALEXin (KEFLEX) 500 MG capsule Take 1 capsule (500 mg total) by mouth 2 (two) times daily for 7 days. 14 capsule Melynda Ripple, NP      PDMP not reviewed this encounter.   Melynda Ripple, NP 11/01/22 1113

## 2022-11-03 LAB — URINE CULTURE: Culture: >=100000 — AB

## 2022-11-19 ENCOUNTER — Other Ambulatory Visit: Payer: Self-pay | Admitting: Family Medicine

## 2022-11-19 DIAGNOSIS — Z853 Personal history of malignant neoplasm of breast: Secondary | ICD-10-CM

## 2022-11-27 ENCOUNTER — Ambulatory Visit: Payer: Medicare HMO | Admitting: Family Medicine

## 2022-11-27 VITALS — BP 138/84 | Ht 67.0 in | Wt 250.0 lb

## 2022-11-27 DIAGNOSIS — M1710 Unilateral primary osteoarthritis, unspecified knee: Secondary | ICD-10-CM

## 2022-11-27 MED ORDER — METHYLPREDNISOLONE ACETATE 40 MG/ML IJ SUSP
40.0000 mg | Freq: Once | INTRAMUSCULAR | Status: AC
  Administered 2022-11-27: 40 mg via INTRA_ARTICULAR

## 2022-11-27 NOTE — Assessment & Plan Note (Signed)
Discussed options.  She still getting excellent relief from corticosteroid injections.  She is used some topical diclofenac gel with good relief as well at least for the first couple of months after her injections.  She uses some over-the-counter NSAIDs about 400 mg of ibuprofen twice a day with food.  Rarely gets above this dose.  Initially after injection therapy she can get by with once a day.  Discussed NSAID dosing and risks.  I think it is fine to continue the topical up to 4 times daily.  Discussed max dose of ibuprofen.  Bilateral corticosteroid injection today.  Follow-up as needed.  Also discussed long-term she may need other type of intervention and she is not ready to consider any kind of surgical issue at this time.

## 2022-11-27 NOTE — Progress Notes (Signed)
  Kendra Thompson - 72 y.o. female MRN [id number]  Date of birth: 06/11/1951    SUBJECTIVE:      Chief Complaint:/ HPI:    Bilateral knee pain Had injections in June and they worked really well through August and into September.  She started having some mild pain in September.  Over the last couple of weeks has gotten to the point where she cannot stand from a seated position without significant pain.  Really interfering with her ability to do any walking at all.  She likes to walk for fitness so would like to get back to that.   OBJECTIVE: BP 138/84   Ht '5\' 7"'$  (1.702 m)   Wt 250 lb (113.4 kg)   BMI 39.16 kg/m   Physical Exam:  Vital signs are reviewed. GENERAL: Well-developed female no acute distress KNEES: Bilaterally they are symmetrical.  There is no effusion on either knee.  She has medial joint line tenderness bilaterally but right greater than left.  Full range of motion extension and flexion.  Ligamentously intact to varus and valgus stress.  Popliteal space is benign. SKIN: Area around the knee is without any sign of erythema, no lesions. PROCEDURE: INJECTION: Patient was given informed consent, signed copy in the chart. Appropriate time out was taken. Area prepped and draped in usual sterile fashion. Ethyl chloride was  used for local anesthesia. A 21 gauge 1 1/2 inch needle was used..  1 cc of methylprednisolone 40 mg/ml plus 4 cc of 1% lidocaine without epinephrine was injected into the bilateral knees using a(n) anterior medial approach.   The patient tolerated the procedure well. There were no complications. Post procedure instructions were given.   ASSESSMENT & PLAN:  See problem based charting & AVS for pt instructions. Arthritis of knee, bilateral, chronic Discussed options.  She still getting excellent relief from corticosteroid injections.  She is used some topical diclofenac gel with good relief as well at least for the first couple of months after her injections.   She uses some over-the-counter NSAIDs about 400 mg of ibuprofen twice a day with food.  Rarely gets above this dose.  Initially after injection therapy she can get by with once a day.  Discussed NSAID dosing and risks.  I think it is fine to continue the topical up to 4 times daily.  Discussed max dose of ibuprofen.  Bilateral corticosteroid injection today.  Follow-up as needed.  Also discussed long-term she may need other type of intervention and she is not ready to consider any kind of surgical issue at this time.

## 2022-12-17 DIAGNOSIS — Z961 Presence of intraocular lens: Secondary | ICD-10-CM | POA: Diagnosis not present

## 2022-12-17 DIAGNOSIS — H31102 Choroidal degeneration, unspecified, left eye: Secondary | ICD-10-CM | POA: Diagnosis not present

## 2022-12-23 ENCOUNTER — Telehealth: Payer: Self-pay | Admitting: Hematology and Oncology

## 2022-12-23 NOTE — Telephone Encounter (Signed)
Left patient a vm regarding appointment change  

## 2022-12-29 ENCOUNTER — Telehealth: Payer: Self-pay | Admitting: Family Medicine

## 2022-12-29 NOTE — Telephone Encounter (Signed)
Contacted Kendra Thompson to schedule their annual wellness visit. Appointment made for 01/08/2023.  Thank you,  [place] Direct dial  [phone]

## 2022-12-30 ENCOUNTER — Encounter: Payer: Self-pay | Admitting: Gastroenterology

## 2023-01-01 ENCOUNTER — Ambulatory Visit: Payer: Medicare HMO | Admitting: Hematology and Oncology

## 2023-01-01 DIAGNOSIS — Z0100 Encounter for examination of eyes and vision without abnormal findings: Secondary | ICD-10-CM | POA: Diagnosis not present

## 2023-01-08 ENCOUNTER — Ambulatory Visit (INDEPENDENT_AMBULATORY_CARE_PROVIDER_SITE_OTHER): Payer: Medicare HMO

## 2023-01-08 DIAGNOSIS — Z0000 Encounter for general adult medical examination without abnormal findings: Secondary | ICD-10-CM

## 2023-01-08 NOTE — Patient Instructions (Signed)

## 2023-01-08 NOTE — Progress Notes (Signed)
I connected with  Kendra Thompson on 01/08/23 by a audio enabled telemedicine application and verified that I am speaking with the correct person using two identifiers.  Patient Location: Home  Provider Location: Home Office  I discussed the limitations of evaluation and management by telemedicine. The patient expressed understanding and agreed to proceed.   Subjective:   Kendra Thompson is a 71 y.o. female who presents for an Initial Medicare Annual Wellness Visit.  Review of Systems    Per HPI unless specifically indicated below.  Cardiac Risk Factors include: advanced age (>87men, >23 women);female gender, and Essential Hypertension.          Objective:      11/27/2022    8:29 AM 11/01/2022   10:58 AM 08/16/2022    8:27 AM  Vitals with BMI  Height 5\' 7"     Weight 250 lbs    BMI 0000000    Systolic 0000000 0000000 [id number]  Diastolic 84 86 80  Pulse  70 88     There were no vitals filed for this visit. There is no height or weight on file to calculate BMI.     01/08/2023    1:26 PM 04/08/2022    9:10 AM 06/23/2021    2:11 PM 05/07/2021    8:05 AM 03/26/2021    8:38 AM 09/11/2020   11:19 AM 10/06/2017    8:43 AM  Advanced Directives  Does Patient Have a Medical Advance Directive? No;Yes No Yes Yes Yes No No  Type of Paramedic of [place];Living will  [place];Living will [place];Living will [place];Living will    Does patient want to make changes to medical advance directive? No - Patient declined  No - Patient declined      Copy of [place] in Chart? No - copy requested  No - copy requested No - copy requested No - copy requested    Would patient like information on creating a medical advance directive? No - Patient declined     No - Patient declined     Current Medications (verified) Outpatient Encounter Medications as of 01/08/2023  Medication Sig   amLODipine  (NORVASC) 10 MG tablet TAKE 1 TABLET BY MOUTH EVERY DAY   anastrozole (ARIMIDEX) 1 MG tablet Take 1 tablet (1 mg total) by mouth daily. Start 08/26/2021   Candesartan Cilexetil-HCTZ 32-25 MG TABS TAKE 1 TABLET BY MOUTH EVERY DAY   diclofenac Sodium (VOLTAREN) 1 % GEL Apply 2 g topically 4 (four) times daily.   fexofenadine (ALLEGRA) 180 MG tablet Take 180 mg by mouth daily as needed for allergies or rhinitis.   gabapentin (NEURONTIN) 100 MG capsule Take 100 mg by mouth daily as needed.   gabapentin (NEURONTIN) 300 MG capsule Take 1 capsule (300 mg total) by mouth at bedtime.   ibuprofen (ADVIL) 200 MG tablet Take 200-600 mg by mouth every 6 (six) hours as needed for headache or moderate pain.   MULTIPLE VITAMIN PO Take 1 tablet by mouth daily.   pantoprazole (PROTONIX) 40 MG tablet TAKE 1 TABLET BY MOUTH EVERY DAY   Olopatadine HCl (PATADAY OP) Place 1 drop into both eyes daily as needed (allergies). (Patient not taking: Reported on 01/08/2023)   No facility-administered encounter medications on file as of 01/08/2023.    Allergies (verified) Latex and Tape   History: Past Medical History:  Diagnosis Date   Allergy 1971   Arthritis 2017  Asthma 1971   has not been present for several years   Breast cancer ([facility]) 04/18/2021   Cataract 2020   Family history of breast cancer 04/30/2021   GERD (gastroesophageal reflux disease)    Headache    Migraines occasionally   Hypertension    Seasonal allergies    Wears glasses    Past Surgical History:  Procedure Laterality Date   BREAST LUMPECTOMY     BREAST LUMPECTOMY WITH RADIOACTIVE SEED LOCALIZATION Left 05/20/2021   Procedure: LEFT BREAST BRACKETED LUMPECTOMY WITH RADIOACTIVE SEED LOCALIZATION x3;  Surgeon: Stark Klein, MD;  Location: [facility];  Service: General;  Laterality: Left;   CLOSED REDUCTION FINGER WITH PERCUTANEOUS PINNING Left 07/25/2013   Procedure: CLOSED REDUCTION FINGER WITH PERCUTANEOUS PINNING LEFT SMALL METACARPAL;   Surgeon: Tennis Must, MD;  Location: [facility];  Service: Orthopedics;  Laterality: Left;   COLONOSCOPY  2013,2014   DIAGNOSTIC LAPAROSCOPY  1992   explor-   DILATION AND CURETTAGE OF UTERUS     FRACTURE SURGERY  2014   HAND SURGERY  02/2020   Family History  Problem Relation Age of Onset   Breast cancer Mother 26   Arthritis Mother    Cancer Mother    Depression Mother    Heart disease Mother    Hypertension Mother    CAD Father        Diagnosed in early 41s   Arthritis Father    Hearing loss Father    Heart disease Father    Esophageal cancer Brother 85   Other Brother 70       neuroendocrine tumor   Alcohol abuse Brother    Breast cancer Maternal Grandmother        dx after 59   Diabetes Maternal Grandmother    Heart disease Maternal Grandmother    Hypertension Maternal Grandmother    Colon cancer Neg Hx    Social History   Socioeconomic History   Marital status: Married    Spouse name: Merchant navy officer   Number of children: 0   Years of education: Not on file   Highest education level: Not on file  Occupational History   Occupation: Retired  Tobacco Use   Smoking status: Never   Smokeless tobacco: Never  Vaping Use   Vaping Use: Never used  Substance and Sexual Activity   Alcohol use: Not Currently    Comment: very rarely   Drug use: Never   Sexual activity: Yes    Birth control/protection: Post-menopausal  Other Topics Concern   Not on file  Social History Narrative   Not on file   Social Determinants of Health   Financial Resource Strain: Low Risk  (01/08/2023)   Overall Financial Resource Strain (CARDIA)    Difficulty of Paying Living Expenses: Not hard at all  Food Insecurity: No Food Insecurity (01/08/2023)   Hunger Vital Sign    Worried About Running Out of Food in the Last Year: Never true    Ran Out of Food in the Last Year: Never true  Transportation Needs: No Transportation Needs (01/08/2023)   PRAPARE - Armed forces logistics/support/administrative officer (Medical): No    Lack of Transportation (Non-Medical): No  Physical Activity: Sufficiently Active (01/08/2023)   Exercise Vital Sign    Days of Exercise per Week: 7 days    Minutes of Exercise per Session: 30 min  Stress: No Stress Concern Present (01/08/2023)   [place]  Questionnaire    Feeling of Stress : Not at all  Social Connections: Moderately Isolated (01/08/2023)   Social Connection and Isolation Panel [NHANES]    Frequency of Communication with Friends and Family: More than three times a week    Frequency of Social Gatherings with Friends and Family: Twice a week    Attends Religious Services: Never    Printmaker: Patient unable to answer    Attends Archivist Meetings: Never    Marital Status: Married    Tobacco Counseling Counseling given: No   Clinical Intake:  Pre-visit preparation completed: No  Pain : No/denies pain     Nutritional Status: BMI > 30  Obese Diabetes: No  How often do you need to have someone help you when you read instructions, pamphlets, or other written materials from your doctor or pharmacy?: 1 - Never  Diabetic?No  Interpreter Needed?: No  Information entered by :: Donnie Mesa, CMA   Activities of Daily Living    01/08/2023    1:13 PM  In your present state of health, do you have any difficulty performing the following activities:  Hearing? 1  Vision? 1  Comment [facility] [place], [place]  Difficulty concentrating or making decisions? 0  Walking or climbing stairs? 1  Dressing or bathing? 0  Doing errands, shopping? 0    Patient Care Team: [name], MD as PCP - General Stark Klein, MD as Consulting Physician (General Surgery) Kyung Rudd, MD as Consulting Physician (Radiation Oncology) Magrinat, Virgie Dad, MD (Inactive) as Consulting Physician (Oncology) Roseanne Kaufman, MD as Consulting Physician  (Orthopedic Surgery) Harmon Pier, RN as Registered Nurse Causey, Charlestine Massed, NP as Nurse Practitioner (Hematology and Oncology)  Indicate any recent Roundup you may have received from other than [facility] providers in the past year (date may be approximate).     Assessment:   This is a routine wellness examination for [facility].  Hearing/Vision screen Admits to some mild hearing loss that she associates with aging. Denies any change to her vision. Wear glasses to read only since having Cataract Surgery. Annual Eye Exam [facility].   Dietary issues and exercise activities discussed: Current Exercise Habits: Structured exercise class, Type of exercise: walking, Time (Minutes): 30, Frequency (Times/Week): 7, Weekly Exercise (Minutes/Week): 210, Intensity: Moderate, Exercise limited by: None identified   Goals Addressed   None    Depression Screen    01/08/2023    1:13 PM 04/08/2022    8:26 AM 08/26/2021    9:20 AM 03/26/2021    8:52 AM 09/11/2020   11:19 AM 12/27/2019    8:52 AM 10/27/2018    8:34 AM  PHQ 2/9 Scores  PHQ - 2 Score 0 0 0 0 0 0 0  PHQ- 9 Score  3  6 2       Fall Risk    01/08/2023    1:13 PM 03/26/2021    8:38 AM 12/27/2019    8:52 AM 12/03/2014    1:39 PM 09/07/2014    9:22 AM  Fall Risk   Falls in the past year? 0 0 1 No   Number falls in past yr: 0 0 0    Injury with Fall? 0      Risk for fall due to : No Fall Risks   Other (Comment)   Follow up Falls evaluation completed  Falls evaluation completed       Information is confidential and restricted. Go  to Review Flowsheets to unlock data.    FALL RISK PREVENTION PERTAINING TO THE HOME:  Any stairs in or around the home? No  If so, are there any without handrails? No  Home free of loose throw rugs in walkways, pet beds, electrical cords, etc? Yes  Adequate lighting in your home to reduce risk of falls? Yes   ASSISTIVE DEVICES UTILIZED TO PREVENT FALLS:  Life alert? No  Use of a cane, walker  or w/c? No  Grab bars in the bathroom? Yes  Shower chair or bench in shower? Yes  Elevated toilet seat or a handicapped toilet? No   TIMED UP AND GO:  Was the test performed?Unable to perform, virtual appointment   Cognitive Function:        01/08/2023    1:15 PM  6CIT Screen  What Year? 0 points  What month? 0 points  What time? 0 points  Count back from 20 0 points  Months in reverse 0 points  Repeat phrase 0 points  Total Score 0 points    Immunizations Immunization History  Administered Date(s) Administered   Influenza Split 03/08/2006   Influenza Whole 07/30/2010, 08/10/2012   Influenza, High Dose Seasonal PF 07/13/2018, 06/14/2019   Influenza,inj,Quad PF,6+ Mos 08/26/2013   Influenza-Unspecified 07/11/2015, 07/23/2016, 07/15/2017, 07/10/2018, 06/27/2020, 07/18/2021, 10/01/2022   Moderna Sars-Covid-2 Vaccination 01/05/2020, 08/28/2020   Pfizer Covid-19 Vaccine Bivalent Booster 16yrs & up 07/18/2021   Pneumococcal Conjugate-13 10/27/2018   Pneumococcal Polysaccharide-23 08/12/2011, 02/19/2020   Respiratory Syncytial Virus Vaccine,Recomb Aduvanted(Arexvy) 10/01/2022   Td 10/27/2003   Tdap 10/27/1999   Zoster Recombinat (Shingrix) 06/27/2020, 11/04/2020   Zoster, Live 09/11/2016    TDAP status: Due, Education has been provided regarding the importance of this vaccine. Advised may receive this vaccine at local pharmacy or Health Dept. Aware to provide a copy of the vaccination record if obtained from local pharmacy or Health Dept. Verbalized acceptance and understanding.  Flu Vaccine status: Up to date  Pneumococcal vaccine status: Up to date  Covid-19 vaccine status: Information provided on how to obtain vaccines.   Qualifies for Shingles Vaccine? Yes   Zostavax completed Yes   Shingrix Completed?: Yes  Screening Tests Health Maintenance  Topic Date Due   DTaP/Tdap/Td (3 - Td or Tdap) 10/26/2013   COVID-19 Vaccine (4 - 2023-24 season) 06/26/2022    COLONOSCOPY (Pts 45-39yrs Insurance coverage will need to be confirmed)  10/06/2022   MAMMOGRAM  01/03/2024   Medicare Annual Wellness (AWV)  01/08/2024   Pneumonia Vaccine 8+ Years old  Completed   INFLUENZA VACCINE  Completed   DEXA SCAN  Completed   Hepatitis C Screening  Completed   Zoster Vaccines- Shingrix  Completed   HPV VACCINES  Aged Out    Health Maintenance  Health Maintenance Due  Topic Date Due   DTaP/Tdap/Td (3 - Td or Tdap) 10/26/2013   COVID-19 Vaccine (4 - 2023-24 season) 06/26/2022   COLONOSCOPY (Pts 45-23yrs Insurance coverage will need to be confirmed)  10/06/2022    Colorectal cancer screening: Type of screening: Colonoscopy. Completed 10/06/2017. Repeat every 5 years. Scheduled with [facility] on 02/16/2023.  Mammogram status: Completed 01/02/2022. Repeat every year  DEXA Scan: completed 03/18/2020  Lung Cancer Screening: (Low Dose CT Chest recommended if Age 109-80 years, 30 pack-year currently smoking OR have quit w/in 15years.) does not qualify.   Lung Cancer Screening Referral: not applicable   Additional Screening:  Hepatitis C Screening: does qualify; Completed 07/31/2015  Vision Screening: Recommended annual  ophthalmology exams for early detection of glaucoma and other disorders of the eye. Is the patient up to date with their annual eye exam?  Yes  Who is the provider or what is the name of the office in which the patient attends annual eye exams? [facility]  If pt is not established with a provider, would they like to be referred to a provider to establish care? No .   Dental Screening: Recommended annual dental exams for proper oral hygiene  Community Resource Referral / Chronic Care Management: CRR required this visit?  No   CCM required this visit?  No      Plan:     I have personally reviewed and noted the following in the patient's chart:   Medical and social history Use of alcohol, tobacco or illicit  drugs  Current medications and supplements including opioid prescriptions. Patient is not currently taking opioid prescriptions. Functional ability and status Nutritional status Physical activity Advanced directives List of other physicians Hospitalizations, surgeries, and ER visits in previous 12 months Vitals Screenings to include cognitive, depression, and falls Referrals and appointments  In addition, I have reviewed and discussed with patient certain preventive protocols, quality metrics, and best practice recommendations. A written personalized care plan for preventive services as well as general preventive health recommendations were provided to patient.    Ms. Reichwein , Thank you for taking time to come for your Medicare Wellness Visit. I appreciate your ongoing commitment to your health goals. Please review the following plan we discussed and let me know if I can assist you in the future.   These are the goals we discussed:  Goals   None     This is a list of the screening recommended for you and due dates:  Health Maintenance  Topic Date Due   DTaP/Tdap/Td vaccine (3 - Td or Tdap) 10/26/2013   COVID-19 Vaccine (4 - 2023-24 season) 06/26/2022   Colon Cancer Screening  10/06/2022   Mammogram  01/03/2024   Medicare Annual Wellness Visit  01/08/2024   Pneumonia Vaccine  Completed   Flu Shot  Completed   DEXA scan (bone density measurement)  Completed   Hepatitis C Screening: USPSTF Recommendation to screen - Ages 92-79 yo.  Completed   Zoster (Shingles) Vaccine  Completed   HPV Vaccine  Aged [address] [place]   01/08/2023   Nurse Notes: Approximately 30 minute Non-Face -To-Face Medicare Wellness Visit

## 2023-01-12 ENCOUNTER — Ambulatory Visit
Admission: RE | Admit: 2023-01-12 | Discharge: 2023-01-12 | Disposition: A | Discharge status: Home / Self Care | Payer: Medicare HMO | Source: Ambulatory Visit | Attending: Family Medicine | Admitting: Family Medicine

## 2023-01-12 DIAGNOSIS — Z853 Personal history of malignant neoplasm of breast: Secondary | ICD-10-CM | POA: Diagnosis not present

## 2023-01-12 DIAGNOSIS — R928 Other abnormal and inconclusive findings on diagnostic imaging of breast: Secondary | ICD-10-CM | POA: Diagnosis not present

## 2023-01-13 ENCOUNTER — Other Ambulatory Visit: Payer: Self-pay

## 2023-01-13 ENCOUNTER — Encounter: Payer: Self-pay | Admitting: Gastroenterology

## 2023-01-13 ENCOUNTER — Telehealth: Payer: Self-pay | Admitting: *Deleted

## 2023-01-13 ENCOUNTER — Ambulatory Visit (AMBULATORY_SURGERY_CENTER): Payer: Medicare HMO

## 2023-01-13 ENCOUNTER — Ambulatory Visit: Payer: Medicare HMO

## 2023-01-13 VITALS — Ht 67.0 in | Wt 250.0 lb

## 2023-01-13 DIAGNOSIS — Z86010 Personal history of colonic polyps: Secondary | ICD-10-CM

## 2023-01-13 MED ORDER — NA SULFATE-K SULFATE-MG SULF 17.5-3.13-1.6 GM/177ML PO SOLN: 1.0000 | Freq: Once | ORAL | 0 refills | Status: AC

## 2023-01-13 NOTE — Telephone Encounter (Signed)
Inbound call from patient states she missed previous call. Patient rescheduled for today at 2 pm in another pre visit room.

## 2023-01-13 NOTE — Telephone Encounter (Signed)
Attempt to reach pt for pre-visit. LM with call back #.

## 2023-01-13 NOTE — Progress Notes (Signed)
Denies allergies to eggs or soy products. Denies complication of anesthesia or sedation. Denies use of weight loss medication. Denies use of O2.   Emmi instructions given for colonoscopy.  

## 2023-01-13 NOTE — Progress Notes (Signed)
error 

## 2023-01-14 ENCOUNTER — Inpatient Hospital Stay: Payer: Medicare HMO | Attending: Hematology and Oncology | Admitting: Hematology and Oncology

## 2023-01-14 VITALS — BP 156/56 | HR 83 | Temp 97.7°F | Resp 17 | Wt 263.3 lb

## 2023-01-14 DIAGNOSIS — Z803 Family history of malignant neoplasm of breast: Secondary | ICD-10-CM | POA: Diagnosis not present

## 2023-01-14 DIAGNOSIS — M792 Neuralgia and neuritis, unspecified: Secondary | ICD-10-CM | POA: Insufficient documentation

## 2023-01-14 DIAGNOSIS — Z79811 Long term (current) use of aromatase inhibitors: Secondary | ICD-10-CM | POA: Insufficient documentation

## 2023-01-14 DIAGNOSIS — N951 Menopausal and female climacteric states: Secondary | ICD-10-CM | POA: Insufficient documentation

## 2023-01-14 DIAGNOSIS — Z79899 Other long term (current) drug therapy: Secondary | ICD-10-CM | POA: Diagnosis not present

## 2023-01-14 DIAGNOSIS — Z809 Family history of malignant neoplasm, unspecified: Secondary | ICD-10-CM | POA: Diagnosis not present

## 2023-01-14 DIAGNOSIS — D0512 Intraductal carcinoma in situ of left breast: Secondary | ICD-10-CM | POA: Diagnosis not present

## 2023-01-14 DIAGNOSIS — Z800 Family history of malignant neoplasm of digestive organs: Secondary | ICD-10-CM | POA: Diagnosis not present

## 2023-01-14 DIAGNOSIS — Z170 Estrogen receptor positive status [ER+]: Secondary | ICD-10-CM | POA: Diagnosis not present

## 2023-01-14 NOTE — Progress Notes (Signed)
[place]  [phone] [phone]     ID: NATILYNN DURIE DOB: August 30, 1951  MR#: [id number]  [id number]  Patient Care Team: [name], MD as PCP - General Stark Klein, MD as Consulting Physician (General Surgery) Kyung Rudd, MD as Consulting Physician (Radiation Oncology) Magrinat, Virgie Dad, MD (Inactive) as Consulting Physician (Oncology) Roseanne Kaufman, MD as Consulting Physician (Orthopedic Surgery) Harmon Pier, RN as Registered Nurse Causey, Charlestine Massed, NP as Nurse Practitioner (Hematology and Oncology) [name], MD  CHIEF COMPLAINT: Noninvasive estrogen receptor positive breast cancer  CURRENT TREATMENT:   INTERVAL HISTORY:  Kinslee returns today for follow up of her noninvasive breast cancer.  Mammogram March 2023, no mammographic evidence of malignancy Since last visit, she has been taking Arimidex as prescribed.  She is taking gabapentin for hot flashes.  She tells me that the hot flashes are present but not bothersome enough to stop Arimidex.  She is also noticed some vaginal dryness.  Rest of the pertinent 10 point ROS reviewed and negative   COVID 19 VACCINATION STATUS: Moderna x3   HISTORY OF CURRENT ILLNESS: From the original intake note:  HEDY SIEGRIST had routine screening mammography on 04/03/2021 showing a possible abnormality in the left breast. She underwent left diagnostic mammography with tomography at [facility] on 04/11/2021 showing: breast density category B; indeterminate calcifications in lower-outer left breast, with largest area measuring 4.9 cm.  Accordingly on 04/18/2021 she proceeded to biopsy of the left breast area in question. The pathology from this procedure ([id number]) showed: ductal carcinoma in situ, intermediate grade. Prognostic indicators significant for: estrogen receptor, 95% positive and progesterone receptor, 80% positive, both with strong staining intensity.     Cancer Staging  Ductal carcinoma in situ (DCIS) of left breast Staging form: Breast, AJCC 8th Edition - Clinical stage from 04/30/2021: Stage 0 (cTis (DCIS), cN0, cM0, ER+, PR+) - Signed by Chauncey Cruel, MD on 04/30/2021 Stage prefix: Initial diagnosis Nuclear grade: G2   The patient's subsequent history is as detailed below.   PAST MEDICAL HISTORY: Past Medical History:  Diagnosis Date   Allergy 72   Arthritis 72   Asthma 72   has not been present for seventy years   Breast cancer ([place]) 70/24/2022   Cataract 72   Family history of breast cancer 04/30/2021   GERD (gastroesophageal reflux disease)    Headache    Migraines occasionally   Hypertension    Seasonal allergies    Wears glasses     PAST SURGICAL HISTORY: Past Surgical History:  Procedure Laterality Date   BREAST LUMPECTOMY     BREAST LUMPECTOMY WITH RADIOACTIVE SEED LOCALIZATION Left 05/20/2021   Procedure: LEFT BREAST BRACKETED LUMPECTOMY WITH RADIOACTIVE SEED LOCALIZATION x3;  Surgeon: Stark Klein, MD;  Location: [facility];  Service: General;  Laterality: Left;   CLOSED REDUCTION FINGER WITH PERCUTANEOUS PINNING Left 07/25/2013   Procedure: CLOSED REDUCTION FINGER WITH PERCUTANEOUS PINNING LEFT SMALL METACARPAL;  Surgeon: Tennis Must, MD;  Location: [facility];  Service: Orthopedics;  Laterality: Left;   COLONOSCOPY  2013,2014   DIAGNOSTIC LAPAROSCOPY  1992   explor-   DILATION AND CURETTAGE OF UTERUS     FRACTURE SURGERY  2014   HAND SURGERY  02/2020    FAMILY HISTORY: Family History  Problem Relation Age of Onset   Breast cancer Mother 65   Arthritis Mother    Cancer Mother    Depression Mother    Heart  disease Mother    Hypertension Mother    CAD Father        Diagnosed in early 41s   Arthritis Father    Hearing loss Father    Heart disease Father    Esophageal cancer Brother 71   Other Brother 61       neuroendocrine tumor   Alcohol abuse Brother    Breast cancer  Maternal Grandmother        dx after 63   Diabetes Maternal Grandmother    Heart disease Maternal Grandmother    Hypertension Maternal Grandmother    Colon cancer Neg Hx    Stomach cancer Neg Hx    Rectal cancer Neg Hx   Her father died at age 21 from heart attack and COPD. Her mother died at age 47 from complications of breast cancer. She was initially diagnosed at age 36, had bilateral cancer, and recurred later in life. Lenor has one brother and one sister. In addition to her mother, she reports breast cancer in her maternal grandmother (age unknown) and her sister ("pre-cancer") at age 26. She also reports cancer in her brother-- stomach at age 50 and neuroendocrine at age 63.   GYNECOLOGIC HISTORY:  No LMP recorded. Patient is postmenopausal. Menarche: 72 years old GX P 0 LMP ~age 41 Contraceptive: used only for one year in mid-20's. HRT never used  Hysterectomy? no BSO? 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 Husband Ray "Oletta Lamas" is a retired Art therapist; he was a Publishing rights manager. 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    ADVANCED DIRECTIVES: in place   HEALTH MAINTENANCE: Social History   Tobacco Use   Smoking status: Never   Smokeless tobacco: Never  Vaping Use   Vaping Use: Never used  Substance Use Topics   Alcohol use: Not Currently    Comment: very rarely   Drug use: Never     Colonoscopy: 09/2017 (Dr. Fuller Plan), recall 2023  PAP: 07/2015, negative  Bone density: 02/2020, -0.7   Allergies  Allergen Reactions   Latex     rash   Tape Rash    Current Outpatient Medications  Medication Sig Dispense Refill   amLODipine (NORVASC) 10 MG tablet TAKE 1 TABLET BY MOUTH EVERY DAY 90 tablet 3   anastrozole (ARIMIDEX) 1 MG tablet Take 1 tablet (1 mg total) by mouth daily. Start 08/26/2021 90 tablet 4   Candesartan Cilexetil-HCTZ 32-25 MG TABS TAKE 1 TABLET BY MOUTH EVERY DAY 90 tablet 3    diclofenac Sodium (VOLTAREN) 1 % GEL Apply 2 g topically 4 (four) times daily.     fexofenadine (ALLEGRA) 180 MG tablet Take 180 mg by mouth daily as needed for allergies or rhinitis.     gabapentin (NEURONTIN) 100 MG capsule Take 100 mg by mouth daily as needed.     gabapentin (NEURONTIN) 300 MG capsule Take 1 capsule (300 mg total) by mouth at bedtime. 90 capsule 4   ibuprofen (ADVIL) 200 MG tablet Take 200-600 mg by mouth every 6 (six) hours as needed for headache or moderate pain.     MULTIPLE VITAMIN PO Take 1 tablet by mouth daily.     Olopatadine HCl (PATADAY OP) Place 1 drop into both eyes daily as needed (allergies).     pantoprazole (PROTONIX) 40 MG tablet TAKE 1 TABLET BY MOUTH EVERY DAY 90 tablet 3   No current facility-administered medications for this visit.  OBJECTIVE: White woman in no acute distress  Vitals:   01/14/23 1124  BP: (!) 156/56  Pulse: 83  Resp: 17  Temp: 97.7 F (36.5 C)  SpO2: 96%       Body mass index is 41.24 kg/m.   Wt Readings from Last 3 Encounters:  01/14/23 263 lb 4.8 oz (119.4 kg)  01/13/23 250 lb (113.4 kg)  11/27/22 250 lb (113.4 kg)      ECOG FS:1 - Symptomatic but completely ambulatory  Physical Exam Constitutional:      Appearance: Normal appearance.  Chest:     Comments: Bilateral breasts inspected.  Left breast smaller secondary to surgery and postradiation changes.  No palpable masses.  No regional adenopathy Musculoskeletal:     Cervical back: Normal range of motion and neck supple. No rigidity.  Neurological:     Mental Status: She is alert.       LAB RESULTS:  CMP     Component Value Date/Time   NA 138 04/08/2022 0913   K 4.8 04/08/2022 0913   CL 98 04/08/2022 0913   CO2 26 04/08/2022 0913   GLUCOSE 124 (H) 04/08/2022 0913   GLUCOSE 124 (H) 04/30/2021 0754   BUN 20 04/08/2022 0913   CREATININE 0.96 04/08/2022 0913   CREATININE 0.93 04/30/2021 0754   CREATININE 0.76 09/09/2016 0928   CALCIUM 9.9  04/08/2022 0913   PROT 7.2 04/08/2022 0913   ALBUMIN 4.4 04/08/2022 0913   AST 15 04/08/2022 0913   AST 17 04/30/2021 0754   ALT 26 04/08/2022 0913   ALT 20 04/30/2021 0754   ALKPHOS 82 04/08/2022 0913   BILITOT 0.5 04/08/2022 0913   BILITOT 0.6 04/30/2021 0754   GFRNONAA >60 04/30/2021 0754   GFRNONAA 84 09/09/2016 0928   GFRAA 81 02/19/2020 0915   GFRAA >89 09/09/2016 0928    No results found for: "TOTALPROTELP", "ALBUMINELP", "A1GS", "A2GS", "BETS", "BETA2SER", "GAMS", "MSPIKE", "SPEI"  Lab Results  Component Value Date   WBC 9.2 04/30/2021   NEUTROABS 5.6 04/30/2021   HGB 13.4 04/30/2021   HCT 40.1 04/30/2021   MCV 89.5 04/30/2021   PLT 303 04/30/2021    No results found for: "LABCA2"  No components found for: "[id number]"  No results for input(s): "INR" in the last 168 hours.  No results found for: "LABCA2"  No results found for: "[id number]"  No results found for: "CAN125"  No results found for: "CAN153"  No results found for: "CA2729"  No components found for: "HGQUANT"  No results found for: "CEA1", "CEA" / No results found for: "CEA1", "CEA"   No results found for: "AFPTUMOR"  No results found for: "CHROMOGRNA"  No results found for: "KPAFRELGTCHN", "LAMBDASER", "KAPLAMBRATIO" (kappa/lambda light chains)  No results found for: "HGBA", "HGBA2QUANT", "HGBFQUANT", "HGBSQUAN" (Hemoglobinopathy evaluation)   No results found for: "LDH"  No results found for: "IRON", "TIBC", "IRONPCTSAT" (Iron and TIBC)  No results found for: "FERRITIN"  Urinalysis    Component Value Date/Time   COLORURINE yellow 07/30/2010 0816   APPEARANCEUR Clear 07/30/2010 0816   LABSPEC 1.025 02/03/2014 0947   PHURINE 6.0 02/03/2014 0947   GLUCOSEU NEGATIVE 02/03/2014 0947   HGBUR TRACE (A) 02/03/2014 0947   HGBUR trace-intact 07/30/2010 0816   BILIRUBINUR negative 11/01/2022 1100   KETONESUR negative 11/01/2022 1100   KETONESUR NEGATIVE 02/03/2014 0947   PROTEINUR  trace (A) 11/01/2022 1100   PROTEINUR NEGATIVE 02/03/2014 0947   UROBILINOGEN 0.2 11/01/2022 1100   UROBILINOGEN 0.2 02/03/2014 0947   NITRITE  Negative 11/01/2022 1100   NITRITE NEGATIVE 02/03/2014 0947   LEUKOCYTESUR Large (3+) (A) 11/01/2022 1100    STUDIES: MM DIAG BREAST TOMO BILATERAL  Result Date: 01/12/2023 CLINICAL DATA:  Patient with history of left breast lumpectomy EXAM: DIGITAL DIAGNOSTIC BILATERAL MAMMOGRAM WITH TOMOSYNTHESIS TECHNIQUE: Bilateral digital diagnostic mammography and breast tomosynthesis was performed. COMPARISON:  Previous exam(s). ACR Breast Density Category b: There are scattered areas of fibroglandular density. FINDINGS: Stable lumpectomy changes left breast. No new masses, calcifications or nonsurgical distortion. IMPRESSION: Lumpectomy changes left breast. No suspicious abnormality bilaterally. RECOMMENDATION: Per protocol, as the patient is now 2 or more years status post lumpectomy, she may return to annual screening mammography in 1 year. However, given the history of breast cancer, the patient remains eligible for annual diagnostic mammography if preferred. I have discussed the findings and recommendations with the patient. If applicable, a reminder letter will be sent to the patient regarding the next appointment. BI-RADS CATEGORY  2: Benign. Electronically Signed   By: Lovey Newcomer M.D.   On: 01/12/2023 08:18    ELIGIBLE FOR AVAILABLE RESEARCH PROTOCOL: Opted against COMET trial  ASSESSMENT: 72 y.o. [place] woman status post left breast biopsy 04/18/2021 for ductal carcinoma in situ, grade 2, estrogen and progesterone receptor positive  (1) genetics testing July 13 and 20, 2022 through the [facility] Panel and CancerNext-Expanded +RNAinsight Panels found no deleterious mutations in ATM, BRCA1, BRCA2, CDH1, CHEK2, PALB2, PTEN, and TP53. AIP, ALK, APC, ATM, AXIN2, BAP1, BARD1, BLM, BMPR1A, BRCA1, BRCA2, BRIP1, CDC73, CDH1, CDK4, CDKN1B, CDKN2A, CHEK2,  CTNNA1, DICER1, FANCC, FH, FLCN, GALNT12, KIF1B, LZTR1, MAX, MEN1, MET, MLH1, MSH2, MSH3, MSH6, MUTYH, NBN, NF1, NF2, NTHL1, PALB2, PHOX2B, PMS2, POT1, PRKAR1A, PTCH1, PTEN, RAD51C, RAD51D, RB1, RECQL, RET, SDHA, SDHAF2, SDHB, SDHC, SDHD, SMAD4, SMARCA4, SMARCB1, SMARCE1, STK11, SUFU, TMEM127, TP53, TSC1, TSC2, VHL and XRCC2 (sequencing and deletion/duplication); EGFR, EGLN1, HOXB13, KIT, MITF, PDGFRA, POLD1, and POLE (sequencing only); EPCAM and GREM1 (deletion/duplication only).   (2) status post left lumpectomy 05/20/2021 for a 3.3 cm ductal carcinoma in situ, described as intermediate to high-grade, with negative margins  (3) adjuvant radiation to be completed 08/05/2021  (4) antiestrogens  (A) bone density 03/18/2020 at the [facility] shows a T score of -0.7 (normal)   PLAN:  She is anastrozole for adjuvant anti estrogen therapy She is tolerating this very well except for some arthralgias and hot flashes.  She takes gabapentin nightly 300 mg for management of hot flashes.  She also takes 100 mg of gabapentin as needed during the day for management of some neuropathic pain. No concerning findings on physical exam.   Mammogram March 2024 with no evidence of malignancy.    With regards to bone density, I have discussed about staying active.  Her previous bone density was normal in May 2021.  We will consider doing another bone density scan this yr, ordered.  Total time : 30 minutes.  *Total Encounter Time as defined by the Centers for Medicare and Medicaid Services includes, in addition to the face-to-face time of a patient visit (documented in the note above) non-face-to-face time: obtaining and reviewing outside history, ordering and reviewing medications, tests or procedures, care coordination (communications with other health care professionals or caregivers) and documentation in the medical record.

## 2023-01-19 ENCOUNTER — Other Ambulatory Visit: Payer: Self-pay | Admitting: Family Medicine

## 2023-01-20 ENCOUNTER — Ambulatory Visit
Admission: RE | Admit: 2023-01-20 | Discharge: 2023-01-20 | Disposition: A | Discharge status: Home / Self Care | Payer: Medicare HMO | Source: Ambulatory Visit | Attending: Internal Medicine | Admitting: Internal Medicine

## 2023-01-20 VITALS — BP 159/77 | HR 85 | Temp 97.7°F | Resp 18

## 2023-01-20 DIAGNOSIS — J069 Acute upper respiratory infection, unspecified: Secondary | ICD-10-CM

## 2023-01-20 DIAGNOSIS — R051 Acute cough: Secondary | ICD-10-CM | POA: Diagnosis not present

## 2023-01-20 MED ORDER — BENZONATATE 100 MG PO CAPS: 100.0000 mg | ORAL_CAPSULE | Freq: Three times a day (TID) | ORAL | 0 refills | Status: DC | PRN

## 2023-01-20 MED ORDER — AMOXICILLIN-POT CLAVULANATE 875-125 MG PO TABS: 1.0000 | ORAL_TABLET | Freq: Two times a day (BID) | ORAL | 0 refills | Status: DC

## 2023-01-20 NOTE — Discharge Instructions (Signed)
Antibiotic and cough medication has been prescribed for your upper respiratory infection. follow-up if any symptoms persist or worsen.

## 2023-01-20 NOTE — ED Provider Notes (Addendum)
[facility]    CSN: [id number] Arrival date & time: 01/20/23  1058      History   Chief Complaint Chief Complaint  Patient presents with   Cough    HPI Kendra Thompson is a 72 y.o. female.   Patient presents with cough and nasal congestion that has been present for about 6 to 7 days.  Patient denies any fevers or known sick contacts.  Reports that she been taking several over-the-counter cold and flu medications with no improvement in symptoms.  Reports that she developed feelings of cough and chest congestion over the past few days.  Reports history of asthma in childhood but no complications since.  Denies chest pain, shortness of breath, gastrointestinal symptoms.   Cough   Past Medical History:  Diagnosis Date   Allergy 1971   Arthritis 2017   Asthma 1971   has not been present for several years   Breast cancer ([place] [facility]) 04/18/2021   Cataract 2020   Family history of breast cancer 04/30/2021   GERD (gastroesophageal reflux disease)    Headache    Migraines occasionally   Hypertension    Seasonal allergies    Wears glasses     Patient Active Problem List   Diagnosis Date Noted   Arthritis of knee, bilateral, chronic 11/27/2022   Genetic testing 05/08/2021   Family history of breast cancer 04/30/2021   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   Onychomycosis of toenail 03/27/2021   Postmenopausal 12/29/2019   Gastroesophageal reflux disease without esophagitis 12/29/2019   Degenerative arthritis of thumb 10/01/2017   Chronic pain of both knees 09/11/2016   Obesity 08/03/2015   History of colon polyps 06/20/2012   Encounter for routine gynecological examination 05/24/2012   Well adult exam 08/12/2011   ASTHMA, INTERMITTENT, MILD 08/01/2010   HYPERGLYCEMIA, BORDERLINE 06/14/2008   DE QUERVAIN'S TENOSYNOVITIS, LEFT WRIST 07/18/2007   Essential hypertension 06/16/2007    Past Surgical History:  Procedure Laterality Date    BREAST LUMPECTOMY     BREAST LUMPECTOMY WITH RADIOACTIVE SEED LOCALIZATION Left 05/20/2021   Procedure: LEFT BREAST BRACKETED LUMPECTOMY WITH RADIOACTIVE SEED LOCALIZATION x3;  Surgeon: Stark Klein, MD;  Location: [facility];  Service: General;  Laterality: Left;   CLOSED REDUCTION FINGER WITH PERCUTANEOUS PINNING Left 07/25/2013   Procedure: CLOSED REDUCTION FINGER WITH PERCUTANEOUS PINNING LEFT SMALL METACARPAL;  Surgeon: Tennis Must, MD;  Location: [facility];  Service: Orthopedics;  Laterality: Left;   COLONOSCOPY  2013,2014   DIAGNOSTIC LAPAROSCOPY  1992   explor-   DILATION AND CURETTAGE OF UTERUS     FRACTURE SURGERY  2014   HAND SURGERY  02/2020    OB History   No obstetric history on file.      Home Medications    Prior to Admission medications   Medication Sig Start Date End Date Taking? Authorizing Provider  amoxicillin-clavulanate (AUGMENTIN) 875-125 MG tablet Take 1 tablet by mouth every 12 (twelve) hours. 01/20/23  Yes Noreene Boreman, Hildred Alamin E, FNP  benzonatate (TESSALON) 100 MG capsule Take 1 capsule (100 mg total) by mouth every 8 (eight) hours as needed for cough. 01/20/23  Yes Cintia Gleed, Hildred Alamin E, FNP  amLODipine (NORVASC) 10 MG tablet TAKE 1 TABLET BY MOUTH EVERY DAY 01/19/23   [name], MD  anastrozole (ARIMIDEX) 1 MG tablet Take 1 tablet (1 mg total) by mouth daily. Start 08/26/2021 08/21/22   [name], MD  Candesartan Cilexetil-HCTZ 32-25 MG TABS TAKE 1  TABLET BY MOUTH EVERY DAY 03/16/22   [name], MD  diclofenac Sodium (VOLTAREN) 1 % GEL Apply 2 g topically 4 (four) times daily.    [provider]  fexofenadine (ALLEGRA) 180 MG tablet Take 180 mg by mouth daily as needed for allergies or rhinitis.    [provider]  gabapentin (NEURONTIN) 100 MG capsule Take 100 mg by mouth daily as needed.    [provider]  gabapentin (NEURONTIN) 300 MG capsule Take 1 capsule (300 mg total) by mouth at bedtime. 10/27/22   [name],  MD  ibuprofen (ADVIL) 200 MG tablet Take 200-600 mg by mouth every 6 (six) hours as needed for headache or moderate pain.    [provider]  MULTIPLE VITAMIN PO Take 1 tablet by mouth daily.    [provider]  Olopatadine HCl (PATADAY OP) Place 1 drop into both eyes daily as needed (allergies).    [provider]  pantoprazole (PROTONIX) 40 MG tablet TAKE 1 TABLET BY MOUTH EVERY DAY 11/03/21   [name], MD    Family History Family History  Problem Relation Age of Onset   Breast cancer Mother 43   Arthritis Mother    Cancer Mother    Depression Mother    Heart disease Mother    Hypertension Mother    CAD Father        Diagnosed in early 73s   Arthritis Father    Hearing loss Father    Heart disease Father    Esophageal cancer Brother 36   Other Brother 59       neuroendocrine tumor   Alcohol abuse Brother    Breast cancer Maternal Grandmother        dx after 43   Diabetes Maternal Grandmother    Heart disease Maternal Grandmother    Hypertension Maternal Grandmother    Colon cancer Neg Hx    Stomach cancer Neg Hx    Rectal cancer Neg Hx     Social History Social History   Tobacco Use   Smoking status: Never   Smokeless tobacco: Never  Vaping Use   Vaping Use: Never used  Substance Use Topics   Alcohol use: Not Currently    Comment: very rarely   Drug use: Never     Allergies   Latex and Tape   Review of Systems Review of Systems Per HPI  Physical Exam Triage Vital Signs ED Triage Vitals [01/20/23 1114]  Enc Vitals Group     BP (!) 159/77     Pulse Rate 85     Resp 18     Temp 97.7 F (36.5 C)     Temp Source Oral     SpO2 96 %     Weight      Height      Head Circumference      Peak Flow      Pain Score 7     Pain Loc      Pain Edu?      Excl. in [place]?    No data found.  Updated Vital Signs BP (!) 159/77 (BP Location: Left Arm)   Pulse 85   Temp 97.7 F (36.5 C) (Oral)   Resp 18   SpO2 96%   Visual  Acuity Right Eye Distance:   Left Eye Distance:   Bilateral Distance:    Right Eye Near:   Left Eye Near:    Bilateral Near:  Physical Exam Constitutional:      General: She is not in acute distress.    Appearance: Normal appearance. She is not toxic-appearing or diaphoretic.  HENT:     Head: Normocephalic and atraumatic.     Right Ear: Tympanic membrane and ear canal normal.     Left Ear: Tympanic membrane and ear canal normal.     Nose: Congestion present.     Mouth/Throat:     Mouth: Mucous membranes are moist.     Pharynx: No posterior oropharyngeal erythema.  Eyes:     Extraocular Movements: Extraocular movements intact.     Conjunctiva/sclera: Conjunctivae normal.     Pupils: Pupils are equal, round, and reactive to light.  Cardiovascular:     Rate and Rhythm: Normal rate and regular rhythm.     Pulses: Normal pulses.     Heart sounds: Normal heart sounds.  Pulmonary:     Effort: Pulmonary effort is normal. No respiratory distress.     Breath sounds: Normal breath sounds. No stridor. No wheezing, rhonchi or rales.  Abdominal:     General: Abdomen is flat. Bowel sounds are normal.     Palpations: Abdomen is soft.  Musculoskeletal:        General: Normal range of motion.     Cervical back: Normal range of motion.  Skin:    General: Skin is warm and dry.  Neurological:     General: No focal deficit present.     Mental Status: She is alert and oriented to person, place, and time. Mental status is at baseline.  Psychiatric:        Mood and Affect: Mood normal.        Behavior: Behavior normal.      UC Treatments / Results  Labs (all labs ordered are listed, but only abnormal results are displayed) Labs Reviewed - No data to display  EKG   Radiology No results found.  Procedures Procedures (including critical care time)  Medications Ordered in UC Medications - No data to display  Initial Impression / Assessment and Plan / UC Course  I have  reviewed the triage vital signs and the nursing notes.  Pertinent labs & imaging results that were available during my care of the patient were reviewed by me and considered in my medical decision making (see chart for details).     Patient's symptoms most likely started off as a viral illness but given duration of symptoms and symptoms being persistent and refractory to over-the-counter medications, I am concerned for secondary bacterial infection.  Therefore, will treat with Augmentin antibiotic. Offered prednisone to decrease inflammation as well but patient wished to defer this.  There are no adventitious lung sounds on exam so do not think that chest imaging is necessary.  Cough medication also prescribed for patient.  Blood pressure is mildly elevated but patient is attributing this to being agitated at the dentist earlier today. States that it is typically normal.  Patient denies that the dentist prescribed any antibiotics. Advised strict follow-up if symptoms persist or worsen.  Patient verbalized understanding and was agreeable with plan. Final Clinical Impressions(s) / UC Diagnoses   Final diagnoses:  Acute upper respiratory infection  Acute cough     Discharge Instructions      Antibiotic and cough medication has been prescribed for your upper respiratory infection. follow-up if any symptoms persist or worsen.    ED Prescriptions     Medication Sig Dispense Auth. Provider   amoxicillin-clavulanate (AUGMENTIN)  875-125 MG tablet Take 1 tablet by mouth every 12 (twelve) hours. 14 tablet [place], [place] E, [place]   benzonatate (TESSALON) 100 MG capsule Take 1 capsule (100 mg total) by mouth every 8 (eight) hours as needed for cough. 21 capsule [place], Michele Rockers, [place]      PDMP not reviewed this encounter.   Teodora Medici, [place] 01/20/23 [place], [place], [place] 01/20/23 1153    Teodora Medici, [place] 01/20/23 1155

## 2023-01-20 NOTE — ED Triage Notes (Signed)
Pt c/o sore throat, sneezing, cough, nasal congestion, headache   Onset ~ last thurs

## 2023-01-26 ENCOUNTER — Other Ambulatory Visit: Payer: Self-pay | Admitting: Family Medicine

## 2023-02-16 ENCOUNTER — Encounter: Payer: Self-pay | Admitting: Gastroenterology

## 2023-02-16 ENCOUNTER — Ambulatory Visit (AMBULATORY_SURGERY_CENTER): Payer: Medicare HMO | Admitting: Gastroenterology

## 2023-02-16 VITALS — BP 105/60 | HR 71 | Temp 98.6°F | Resp 14 | Ht 67.0 in | Wt 250.0 lb

## 2023-02-16 DIAGNOSIS — D123 Benign neoplasm of transverse colon: Secondary | ICD-10-CM | POA: Diagnosis not present

## 2023-02-16 DIAGNOSIS — Z86010 Personal history of colonic polyps: Secondary | ICD-10-CM

## 2023-02-16 DIAGNOSIS — D125 Benign neoplasm of sigmoid colon: Secondary | ICD-10-CM | POA: Diagnosis not present

## 2023-02-16 DIAGNOSIS — K635 Polyp of colon: Secondary | ICD-10-CM | POA: Diagnosis not present

## 2023-02-16 DIAGNOSIS — Z09 Encounter for follow-up examination after completed treatment for conditions other than malignant neoplasm: Secondary | ICD-10-CM | POA: Diagnosis not present

## 2023-02-16 MED ORDER — SODIUM CHLORIDE 0.9 % IV SOLN: 500.0000 mL | INTRAVENOUS | Status: DC

## 2023-02-16 NOTE — Progress Notes (Signed)
Called to room to assist during endoscopic procedure.  Patient ID and intended procedure confirmed with present staff. Received instructions for my participation in the procedure from the performing physician.  

## 2023-02-16 NOTE — Progress Notes (Signed)
Pt's states no medical or surgical changes since previsit or office visit. 

## 2023-02-16 NOTE — Op Note (Signed)
[facility] Patient Name: Kendra Thompson Procedure Date: 02/16/2023 7:59 AM MRN: [id number] Endoscopist: Meryl Dare , MD, [phone] Age: 72 Referring MD:  Date of Birth: 04/01/1951 Gender: Female Account #: [id number] Procedure:                Colonoscopy Indications:              High risk colon cancer surveillance: Personal                            history of sessile serrated colon polyp (less than                            10 mm in size) with no dysplasia Medicines:                Monitored Anesthesia Care Procedure:                Pre-Anesthesia Assessment:                           - Prior to the procedure, a History and Physical                            was performed, and patient medications and                            allergies were reviewed. The patient's tolerance of                            previous anesthesia was also reviewed. The risks                            and benefits of the procedure and the sedation                            options and risks were discussed with the patient.                            All questions were answered, and informed consent                            was obtained. Prior Anticoagulants: The patient has                            taken no anticoagulant or antiplatelet agents. ASA                            Grade Assessment: II - A patient with mild systemic                            disease. After reviewing the risks and benefits,                            the patient was deemed in satisfactory condition to  undergo the procedure.                           After obtaining informed consent, the colonoscope                            was passed under direct vision. Throughout the                            procedure, the patient's blood pressure, pulse, and                            oxygen saturations were monitored continuously. The                            PCF-HQ190L Colonoscope  [id number] was introduced                            through the anus and advanced to the the cecum,                            identified by appendiceal orifice and ileocecal                            valve. The ileocecal valve, appendiceal orifice,                            and rectum were photographed. The quality of the                            bowel preparation was adequate. The colonoscopy was                            performed without difficulty. The patient tolerated                            the procedure well. Scope In: 8:04:28 AM Scope Out: 8:22:43 AM Scope Withdrawal Time: 0 hours 14 minutes 2 seconds  Total Procedure Duration: 0 hours 18 minutes 15 seconds  Findings:                 The perianal and digital rectal examinations were                            normal.                           Five sessile polyps were found in the sigmoid colon                            (2) and transverse colon (3). The polyps were 5 to                            8 mm in size. These polyps were removed with a cold  snare. Resection and retrieval were complete.                           A tattoo was seen in the cecum. A post-polypectomy                            scar was found at the tattoo site.                           Multiple large-mouthed and medium-mouthed                            diverticula were found in the left colon. There was                            narrowing of the colon in association with the                            diverticular opening. There was evidence of an                            impacted diverticulum. There was no evidence of                            diverticular bleeding.                           Internal hemorrhoids were found during                            retroflexion. The hemorrhoids were small and Grade                            I (internal hemorrhoids that do not prolapse).                           The exam was  otherwise without abnormality on                            direct and retroflexion views. Complications:            No immediate complications. Estimated blood loss:                            None. Estimated Blood Loss:     Estimated blood loss: none. Impression:               - Five 5 to 8 mm polyps in the sigmoid colon and in                            the transverse colon, removed with a cold snare.                            Resected and retrieved.                           -  A tattoo was seen in the cecum. A                            post-polypectomy scar was found at the tattoo site.                           - Severe diverticulosis in the left colon. There                            was narrowing of the colon in association with the                            diverticular opening. There was evidence of an                            impacted diverticulum. There was no evidence of                            diverticular bleeding.                           - Internal hemorrhoids.                           - The examination was otherwise normal on direct                            and retroflexion views. Recommendation:           - Repeat colonoscopy date to be determined after                            pending pathology results are reviewed for                            surveillance based on pathology results.                           - Patient has a contact number available for                            emergencies. The signs and symptoms of potential                            delayed complications were discussed with the                            patient. Return to normal activities tomorrow.                            Written discharge instructions were provided to the                            patient.                           -  High fiber diet.                           - Continue present medications.                           - Await pathology results. Meryl Dare, MD 02/16/2023 8:29:10 AM This report has been signed electronically.

## 2023-02-16 NOTE — Progress Notes (Signed)
See 01/20/2023 H&P, no changes 

## 2023-02-16 NOTE — Progress Notes (Signed)
A and O x3. Report to RN. Tolerated MAC anesthesia well. 

## 2023-02-16 NOTE — Patient Instructions (Addendum)
Recommendation:Repeat colonoscopy date to be determined after                            pending pathology results are reviewed for                            surveillance based on pathology results.                           - Patient has a contact number available for                            emergencies. The signs and symptoms of potential                            delayed complications were discussed with the                            patient. Return to normal activities tomorrow.                            Written discharge instructions were provided to the                            patient.                           - High fiber diet.                           - Continue present medications.                           - Await pathology results.  YOU HAD AN ENDOSCOPIC PROCEDURE TODAY AT THE [facility]:   Refer to the procedure report that was given to you for any specific questions about what was found during the examination.  If the procedure report does not answer your questions, please call your gastroenterologist to clarify.  If you requested that your care partner not be given the details of your procedure findings, then the procedure report has been included in a sealed envelope for you to review at your convenience later.  YOU SHOULD EXPECT: Some feelings of bloating in the abdomen. Passage of more gas than usual.  Walking can help get rid of the air that was put into your GI tract during the procedure and reduce the bloating. If you had a lower endoscopy (such as a colonoscopy or flexible sigmoidoscopy) you may notice spotting of blood in your stool or on the toilet paper. If you underwent a bowel prep for your procedure, you may not have a normal bowel movement for a few days.  Please Note:  You might notice some irritation and congestion in your nose or some drainage.  This is from the oxygen used during your procedure.  There is no need for concern and it should  clear up in a day or so.  SYMPTOMS TO REPORT IMMEDIATELY:  Following lower endoscopy (colonoscopy or flexible sigmoidoscopy):  Excessive amounts of blood in the stool  Significant tenderness or worsening of abdominal pains  Swelling of the abdomen that is new, acute  Fever of 100F or higher  Handouts on polyps, diverticulosis, hemorrhoids and High Fiber diet given.  For urgent or emergent issues, a gastroenterologist can be reached at any hour by calling [phone]. Do not use MyChart messaging for urgent concerns.    DIET:  We do recommend a small meal at first, but then you may proceed to your regular diet.  Drink plenty of fluids but you should avoid alcoholic beverages for 24 hours.  ACTIVITY:  You should plan to take it easy for the rest of today and you should NOT DRIVE or use heavy machinery until tomorrow (because of the sedation medicines used during the test).    FOLLOW UP: Our staff will call the number listed on your records the next business day following your procedure.  We will call around 7:15- 8:00 am to check on you and address any questions or concerns that you may have regarding the information given to you following your procedure. If we do not reach you, we will leave a message.     If any biopsies were taken you will be contacted by phone or by letter within the next 1-3 weeks.  Please call us at [phone] if you have not heard about the biopsies in 3 weeks.    SIGNATURES/CONFIDENTIALITY: You and/or your care partner have signed paperwork which will be entered into your electronic medical record.  These signatures attest to the fact that that the information above on your After Visit Summary has been reviewed and is understood.  Full responsibility of the confidentiality of this discharge information lies with you and/or your care-partner.

## 2023-02-17 ENCOUNTER — Telehealth: Payer: Self-pay

## 2023-02-17 NOTE — Telephone Encounter (Signed)
  Follow up Call-     02/16/2023    7:41 AM  Call back number  Post procedure Call Back phone  # [phone]  Permission to leave phone message Yes     Patient questions:  Do you have a fever, pain , or abdominal swelling? No. Pain Score  0 *  Have you tolerated food without any problems? Yes.    Have you been able to return to your normal activities? Yes.    Do you have any questions about your discharge instructions: Diet   No. Medications  No. Follow up visit  No.  Do you have questions or concerns about your Care? No.  Actions: * If pain score is 4 or above: No action needed, pain <4.

## 2023-02-26 ENCOUNTER — Ambulatory Visit (INDEPENDENT_AMBULATORY_CARE_PROVIDER_SITE_OTHER): Payer: Medicare HMO | Admitting: Family Medicine

## 2023-02-26 VITALS — BP 132/75 | Ht 67.0 in | Wt 250.0 lb

## 2023-02-26 DIAGNOSIS — G8929 Other chronic pain: Secondary | ICD-10-CM | POA: Diagnosis not present

## 2023-02-26 DIAGNOSIS — M546 Pain in thoracic spine: Secondary | ICD-10-CM | POA: Diagnosis not present

## 2023-02-26 DIAGNOSIS — M25561 Pain in right knee: Secondary | ICD-10-CM | POA: Diagnosis not present

## 2023-02-26 DIAGNOSIS — M25562 Pain in left knee: Secondary | ICD-10-CM | POA: Diagnosis not present

## 2023-02-26 MED ORDER — METHYLPREDNISOLONE ACETATE 40 MG/ML IJ SUSP
40.0000 mg | Freq: Once | INTRAMUSCULAR | Status: AC
Start: 2023-02-26 — End: 2023-02-26
  Administered 2023-02-26: 40 mg via INTRA_ARTICULAR

## 2023-02-26 MED ORDER — CYCLOBENZAPRINE HCL 5 MG PO TABS
5.0000 mg | ORAL_TABLET | Freq: Three times a day (TID) | ORAL | 0 refills | Status: AC | PRN
Start: 2023-02-26 — End: ?

## 2023-02-26 NOTE — Progress Notes (Signed)
  Kendra Thompson - 72 y.o. female MRN [id number]  Date of birth: 1951-07-21    SUBJECTIVE:      Chief Complaint:/ HPI:  #1.  Would like to get bilateral knee injections.  Over the last month they both started hurting.  The last injection she got really helped quite a bit until about 3 or 4 weeks ago. 2.  Strained a muscle in her back it has been quite painful.  Catches if she moves a certain way.  Wraps around the left side up under her breast.  She was reaching behind her to get a roll of toilet paper and felt something pop.  For couple of days it did not bother her and then she had sudden onset of acute pain which is gotten a little better over the last 2 to 3 days.  Other than when she is having a specific spasm in this area she has no shortness of breath.  No cough.  No anterior chest pain.   OBJECTIVE: BP 132/75   Ht 5\' 7"  (1.702 m)   Wt 250 lb (113.4 kg)   BMI 39.16 kg/m   Physical Exam:  Vital signs are reviewed. BACK: Tender to palpation at the intervertebral muscles between ribs T10 and T11 with some mild tenderness to palpation along the thoracic back over to the side.  No defect.  Pain is exactly reproducible with deep palpation in this area. KNEES: Bilaterally symmetrical.  No effusion.  Medial joint line tenderness bilaterally greater on the right.   PROCEDURE: INJECTION: Patient was given informed consent, signed copy in the chart. Appropriate time out was taken. Area prepped and draped in usual sterile fashion. Ethyl chloride was  used for local anesthesia. A 21 gauge 1 1/2 inch needle was used.. 1 cc of methylprednisolone 40 mg/ml plus  4 cc of 1% lidocaine without epinephrine was injected into the bilateral knees  using a(n) anterior medial approach.   The patient tolerated the procedure well. There were no complications. Post procedure instructions were given.  ASSESSMENT & PLAN:  See problem based charting & AVS for pt instructions. No problem-specific Assessment &  Plan notes found for this encounter. #1.  DJD bilateral knees.  Has responded well to corticosteroid injections in the past and we injected both of them today.  She tolerated procedure well. 2.  Muscle strain in the left thoracic area.  We discussed conservative measures.  I offered trigger point injection but she would like to continue with icing, heat, and gentle stretching.  I did rx cyclobenzaprine because she is having trouble sleeping at night secondary to this.  If this does not continue to resolve over the next few days, she will let me know.  We could consider trigger point injection.  Her symptoms could also be caused by compression fracture so I would consider getting x-ray if she does not resolve.

## 2023-03-01 ENCOUNTER — Encounter: Payer: Self-pay | Admitting: Gastroenterology

## 2023-03-01 ENCOUNTER — Telehealth: Payer: Self-pay | Admitting: *Deleted

## 2023-03-01 DIAGNOSIS — M546 Pain in thoracic spine: Secondary | ICD-10-CM

## 2023-03-01 MED ORDER — TRAMADOL HCL 50 MG PO TABS: ORAL_TABLET | ORAL | 0 refills | Status: DC

## 2023-03-01 NOTE — Telephone Encounter (Unsigned)
I have placed an order for x rays. I will call her in an hour or so.

## 2023-03-02 ENCOUNTER — Ambulatory Visit
Admission: RE | Admit: 2023-03-02 | Discharge: 2023-03-02 | Disposition: A | Discharge status: Home / Self Care | Payer: Medicare HMO | Source: Ambulatory Visit | Attending: Family Medicine | Admitting: Family Medicine

## 2023-03-02 DIAGNOSIS — R0789 Other chest pain: Secondary | ICD-10-CM | POA: Diagnosis not present

## 2023-03-02 DIAGNOSIS — M546 Pain in thoracic spine: Secondary | ICD-10-CM | POA: Diagnosis not present

## 2023-03-04 ENCOUNTER — Encounter: Payer: Self-pay | Admitting: Family Medicine

## 2023-03-11 ENCOUNTER — Ambulatory Visit (INDEPENDENT_AMBULATORY_CARE_PROVIDER_SITE_OTHER): Payer: Medicare HMO | Admitting: Student

## 2023-03-11 ENCOUNTER — Other Ambulatory Visit: Payer: Self-pay

## 2023-03-11 ENCOUNTER — Ambulatory Visit (HOSPITAL_COMMUNITY)
Admission: RE | Admit: 2023-03-11 | Discharge: 2023-03-11 | Disposition: A | Discharge status: Home / Self Care | Payer: Medicare HMO | Source: Ambulatory Visit | Attending: Family Medicine | Admitting: Family Medicine

## 2023-03-11 ENCOUNTER — Encounter: Payer: Self-pay | Admitting: Student

## 2023-03-11 VITALS — BP 134/57 | HR 80 | Ht 67.0 in | Wt 251.8 lb

## 2023-03-11 DIAGNOSIS — I493 Ventricular premature depolarization: Secondary | ICD-10-CM | POA: Diagnosis not present

## 2023-03-11 DIAGNOSIS — R002 Palpitations: Secondary | ICD-10-CM | POA: Diagnosis not present

## 2023-03-11 NOTE — Progress Notes (Signed)
    SUBJECTIVE:   CHIEF COMPLAINT / HPI:   Kendra Thompson is a 72 y.o. female  presenting for concern of irregular palpitations.    Palpitations: Began Monday evening with a short less than 1 minute episode of fluttering in her chest.  Progressively has gotten longer in duration with last night being most the night before she fell asleep feeling quivering in her chest.  She reports having mild lightheadedness associated with the symptoms at night.  She reports having similar symptoms during the day but they do not bother her or interfere with her daily activities.  She denies chest pain or shortness of breath.  She reports she has a remote history of A-fib in her 30s where she was on a medication but was stopped due to the irregular heartbeat resolving.  She has not had any trouble with her heart in the last 30 years.  PERTINENT  PMH / PSH: Reviewed and updated   OBJECTIVE:   BP (!) 134/57   Pulse 80   Ht 5\' 7"  (1.702 m)   Wt 251 lb 12.8 oz (114.2 kg)   SpO2 98%   BMI 39.44 kg/m   Well-appearing, no acute distress Cardio: Regular rate, regular rhythm, no murmurs on exam. Pulm: Clear, no wheezing, no crackles. No increased work of breathing Abdominal: bowel sounds present, soft, non-tender, non-distended Extremities: no peripheral edema   EKG: sinus rhythm, normal intervals with no ST-elevations or depressions, no Qtc prolongation, one PVC noted   ASSESSMENT/PLAN:   Palpitations History concerning for possible paroxysmal A-fib.  Unfortunately she was not symptomatic when EKG was obtained in the office.  EKG is within normal limits and not showing irregular heartbeat.  Will check TSH, CBC to rule out other causes.  Patient will most likely need a Zio patch for long-term cardiac monitoring to catch palpitations.  Sent in referral for cardiology to help with paroxysmal A-fib workup.  In the office vital signs are stable, with no tachycardia and mildly elevated systolic BP. Otherwise  stable for continued outpatient workup.      Glendale Chard, DO [facility]

## 2023-03-11 NOTE — Patient Instructions (Signed)
It was great to see you today!   Today we addressed: Irregular heartbeat: I am sending in a referral to cardiology for further workup. I would also like to check your blood levels and tyroid. I will post results to your mychart.   Future Appointments  Date Time Provider Department Center  03/11/2023  9:15 AM Glendale Chard, DO [facility]  04/21/2023  8:30 AM Nestor Ramp, MD [facility]  07/23/2023  8:30 AM [facility] DX DEXA 1 [facility]  01/17/2024  8:15 AM Rachel Moulds, MD [facility] None  02/01/2024  1:00 PM [facility] ANNUAL WELLNESS VISIT [facility]    Please arrive 15 minutes before your appointment to ensure smooth check in process.    Please call the clinic at [phone] if your symptoms worsen or you have any concerns.  Thank you for allowing me to participate in your care, Dr. Glendale Chard [facility]

## 2023-03-11 NOTE — Assessment & Plan Note (Addendum)
History concerning for possible paroxysmal A-fib.  Unfortunately she was not symptomatic when EKG was obtained in the office.  EKG is within normal limits and not showing irregular heartbeat.  Will check TSH, CBC to rule out other causes.  Patient will most likely need a Zio patch for long-term cardiac monitoring to catch palpitations.  Sent in referral for cardiology to help with paroxysmal A-fib workup.  In the office vital signs are stable, with no tachycardia and mildly elevated systolic BP. Otherwise stable for continued outpatient workup.

## 2023-03-12 LAB — CBC
Hematocrit: 38.7 % (ref 34.0–46.6)
Hemoglobin: 13 g/dL (ref 11.1–15.9)
MCH: 29.4 pg (ref 26.6–33.0)
MCHC: 33.6 g/dL (ref 31.5–35.7)
MCV: 88 fL (ref 79–97)
Platelets: 304 10*3/uL (ref 150–450)
RBC: 4.42 x10E6/uL (ref 3.77–5.28)
RDW: 12.6 % (ref 11.7–15.4)
WBC: 8.2 10*3/uL (ref 3.4–10.8)

## 2023-03-12 LAB — TSH RFX ON ABNORMAL TO FREE T4: TSH: 1.26 u[IU]/mL (ref 0.450–4.500)

## 2023-04-21 ENCOUNTER — Encounter: Payer: Self-pay | Admitting: Family Medicine

## 2023-04-21 ENCOUNTER — Ambulatory Visit (INDEPENDENT_AMBULATORY_CARE_PROVIDER_SITE_OTHER): Payer: Medicare HMO | Admitting: Family Medicine

## 2023-04-21 VITALS — BP 138/60 | HR 81 | Ht 67.0 in | Wt 250.8 lb

## 2023-04-21 DIAGNOSIS — Z0000 Encounter for general adult medical examination without abnormal findings: Secondary | ICD-10-CM | POA: Diagnosis not present

## 2023-04-21 DIAGNOSIS — R002 Palpitations: Secondary | ICD-10-CM | POA: Diagnosis not present

## 2023-04-21 DIAGNOSIS — R7309 Other abnormal glucose: Secondary | ICD-10-CM

## 2023-04-21 DIAGNOSIS — T148XXA Other injury of unspecified body region, initial encounter: Secondary | ICD-10-CM

## 2023-04-21 DIAGNOSIS — Z23 Encounter for immunization: Secondary | ICD-10-CM

## 2023-04-21 DIAGNOSIS — I10 Essential (primary) hypertension: Secondary | ICD-10-CM | POA: Diagnosis not present

## 2023-04-21 LAB — POCT GLYCOSYLATED HEMOGLOBIN (HGB A1C): Hemoglobin A1C: 5.9 % — AB (ref 4.0–5.6)

## 2023-04-21 NOTE — Patient Instructions (Signed)
Great to see you!   

## 2023-04-22 ENCOUNTER — Encounter: Payer: Self-pay | Admitting: Family Medicine

## 2023-04-22 LAB — COMPREHENSIVE METABOLIC PANEL
ALT: 29 IU/L (ref 0–32)
AST: 18 IU/L (ref 0–40)
Albumin: 4.4 g/dL (ref 3.9–4.9)
Alkaline Phosphatase: 93 IU/L (ref 44–121)
BUN/Creatinine Ratio: 19 (ref 12–28)
BUN: 17 mg/dL (ref 8–27)
Bilirubin Total: 0.6 mg/dL (ref 0.0–1.2)
CO2: 24 mmol/L (ref 20–29)
Calcium: 9.5 mg/dL (ref 8.7–10.3)
Chloride: 97 mmol/L (ref 96–106)
Creatinine, Ser: 0.88 mg/dL (ref 0.57–1.00)
Globulin, Total: 2.6 g/dL (ref 1.5–4.5)
Glucose: 119 mg/dL — ABNORMAL HIGH (ref 70–99)
Potassium: 4.4 mmol/L (ref 3.5–5.2)
Sodium: 139 mmol/L (ref 134–144)
Total Protein: 7 g/dL (ref 6.0–8.5)
eGFR: 71 mL/min/{1.73_m2} (ref >59)

## 2023-04-22 LAB — LIPID PANEL
Chol/HDL Ratio: 5.2 ratio — ABNORMAL HIGH (ref 0.0–4.4)
Cholesterol, Total: 191 mg/dL (ref 100–199)
HDL: 37 mg/dL — ABNORMAL LOW (ref >39)
LDL Chol Calc (NIH): 129 mg/dL — ABNORMAL HIGH (ref 0–99)
Triglycerides: 136 mg/dL (ref 0–149)
VLDL Cholesterol Cal: 25 mg/dL (ref 5–40)

## 2023-04-22 NOTE — Assessment & Plan Note (Signed)
Her thoughts now are that what she thought were palpitations may be more related to lung issues.  They have resolved either way.  Given the uncertainty of symptoms, I would recommend she keep her follow-up appointment with cardiology.  It could still be paroxysmal atrial fibrillation.  She agrees.  Should symptoms return, she will let me know.

## 2023-04-22 NOTE — Progress Notes (Signed)
    CHIEF COMPLAINT / HPI: Here for health maintenance exam. 1.:  Recent issues include back pain midline left thoracic, this is resolved.  She was also having some recurrence of breast edema and this has resolved. 2.  Was seen here for fluttering in her chest.  They have set her up with cardiology.  She has had only 1 or 2 recurrences since that office visit and said they both resolved when she took some Sudafed.  She is thinking now that it may be more related to chest congestion then to heart issues.  She wonders if perhaps due to the back pain, she was not taking deep breaths and maybe had a little bit of atelectasis in her lung.  Totally asymptomatic from that standpoint now.  Wonders if she should keep the cardiology appointment. #3.  She is an avid gardener and had a puncture wound in her right hand day before yesterday.  It is starting to heal but she is very concerned about it because she does remember her last tetanus shot.   PERTINENT  PMH / PSH: I have reviewed the patient's medications, allergies, past medical and surgical history, smoking status and updated in the EMR as appropriate.   OBJECTIVE:  BP 138/60   Pulse 81   Ht 5\' 7"  (1.702 m)   Wt 250 lb 12.8 oz (113.8 kg)   SpO2 96%   BMI 39.28 kg/m  Vital signs reviewed. GENERAL: Well-developed, well-nourished, no acute distress. CARDIOVASCULAR: Regular rate and rhythm no murmur gallop or rub LUNGS: Clear to auscultation bilaterally, no rales or wheeze. ABDOMEN: Soft positive bowel sounds NEURO: No gross focal neurological deficits. MSK: Movement of extremity x 4. PSYCH: AxOx4. Good eye contact.. No psychomotor retardation or agitation. Appropriate speech fluency and content. Asks and answers questions appropriately. Mood is congruent. VASCULAR: Radial pulses 2+ bilateral symmetrical.  Dorsalis pedis and posterior tibial pulses also 2+ bilaterally symmetrical. SKIN: Small puncture wound on the dorsum of the right hand.  Small  amount of erythema surrounding but no induration, no discharge.  Mildly tender to palpation.  ASSESSMENT / PLAN: Puncture wound (04/19/2023), right hand,while gardening: I will update her tetanus today.  Does not look like she had needs antibiotics at this time but should it get worse, she will let me know immediately.  Well adult health check Will draw some labs today.  Updated her health maintenance.  Overall she is doing quite well.  Palpitations Her thoughts now are that what she thought were palpitations may be more related to lung issues.  They have resolved either way.  Given the uncertainty of symptoms, I would recommend she keep her follow-up appointment with cardiology.  It could still be paroxysmal atrial fibrillation.  She agrees.  Should symptoms return, she will let me know.   Denny Levy MD

## 2023-04-22 NOTE — Assessment & Plan Note (Signed)
Will draw some labs today.  Updated her health maintenance.  Overall she is doing quite well.

## 2023-04-23 ENCOUNTER — Other Ambulatory Visit: Payer: Self-pay | Admitting: Family Medicine

## 2023-05-05 NOTE — Progress Notes (Signed)
Referring-Kendra Lum Babe MD Reason for referral-palpitations  HPI: 72 year old female for evaluation of palpitations at request of Janit Pagan MD. Patient seen previously but not since 2019.  Laboratories May 2024 showed normal TSH, hemoglobin 13.  Laboratories June 2024 showed total cholesterol 191, LDL 129, creatinine 0.88, sodium 139, potassium 4.4, normal liver functions.  In May the patient was reaching behind her for toilet paper and suddenly had pain in her chest and shoulder.  She continued to have pain intermittently predominantly with coughing, sneezing or certain movements.  This has slowly improved.  She also developed some congestion and a "fluttering sensation" in her chest.  This has also resolved.  She now denies any dyspnea on exertion, orthopnea, PND, pedal edema, exertional chest pain or syncope.  Cardiology now asked to evaluate.  Current Outpatient Medications  Medication Sig Dispense Refill   amLODipine (NORVASC) 10 MG tablet TAKE 1 TABLET BY MOUTH EVERY DAY 90 tablet 2   anastrozole (ARIMIDEX) 1 MG tablet Take 1 tablet (1 mg total) by mouth daily. Start 08/26/2021 90 tablet 4   benzonatate (TESSALON) 100 MG capsule Take 1 capsule (100 mg total) by mouth every 8 (eight) hours as needed for cough. 21 capsule 0   Candesartan Cilexetil-HCTZ 32-25 MG TABS TAKE 1 TABLET BY MOUTH EVERY DAY 90 tablet 3   cyclobenzaprine (FLEXERIL) 5 MG tablet Take 1 tablet (5 mg total) by mouth 3 (three) times daily as needed for muscle spasms. 30 tablet 0   diclofenac Sodium (VOLTAREN) 1 % GEL Apply 2 g topically 4 (four) times daily.     fexofenadine (ALLEGRA) 180 MG tablet Take 180 mg by mouth daily as needed for allergies or rhinitis.     gabapentin (NEURONTIN) 100 MG capsule Take 100 mg by mouth daily as needed.     gabapentin (NEURONTIN) 300 MG capsule Take 1 capsule (300 mg total) by mouth at bedtime. 90 capsule 4   ibuprofen (ADVIL) 200 MG tablet Take 200-600 mg by mouth every 6  (six) hours as needed for headache or moderate pain.     MULTIPLE VITAMIN PO Take 1 tablet by mouth daily.     pantoprazole (PROTONIX) 40 MG tablet TAKE 1 TABLET BY MOUTH EVERY DAY 90 tablet 2   traMADol (ULTRAM) 50 MG tablet Take one or two by mouth every 8 hours as needed for pain (Patient not taking: Reported on 05/17/2023) 50 tablet 0   No current facility-administered medications for this visit.    Allergies  Allergen Reactions   Latex     rash   Tape Rash     Past Medical History:  Diagnosis Date   Allergy 1971   Asthma 1971   has not been present for several years   Breast cancer (HCC) 04/18/2021   Cataract 2020   Family history of breast cancer 04/30/2021   Headache    Migraines occasionally   Hypertension    Seasonal allergies    Wears glasses     Past Surgical History:  Procedure Laterality Date   BREAST LUMPECTOMY     BREAST LUMPECTOMY WITH RADIOACTIVE SEED LOCALIZATION Left 05/20/2021   Procedure: LEFT BREAST BRACKETED LUMPECTOMY WITH RADIOACTIVE SEED LOCALIZATION x3;  Surgeon: Almond Lint, MD;  Location: [facility];  Service: General;  Laterality: Left;   CLOSED REDUCTION FINGER WITH PERCUTANEOUS PINNING Left 07/25/2013   Procedure: CLOSED REDUCTION FINGER WITH PERCUTANEOUS PINNING LEFT SMALL METACARPAL;  Surgeon: Tami Ribas, MD;  Location: [facility];  Service: Orthopedics;  Laterality: Left;   COLONOSCOPY  2013,2014   DIAGNOSTIC LAPAROSCOPY  1992   explor-   DILATION AND CURETTAGE OF UTERUS     FRACTURE SURGERY  2014   HAND SURGERY  02/2020    Social History   Socioeconomic History   Marital status: Married    Spouse name: Personal assistant   Number of children: 0   Years of education: Not on file   Highest education level: Not on file  Occupational History   Occupation: Retired  Tobacco Use   Smoking status: Never   Smokeless tobacco: Never  Vaping Use   Vaping status: Never Used  Substance and Sexual Activity   Alcohol use: Not  Currently    Comment: very rarely   Drug use: Never   Sexual activity: Yes    Birth control/protection: Post-menopausal  Other Topics Concern   Not on file  Social History Narrative   Not on file   Social Determinants of Health   Financial Resource Strain: Low Risk  (01/08/2023)   Overall Financial Resource Strain (CARDIA)    Difficulty of Paying Living Expenses: Not hard at all  Food Insecurity: No Food Insecurity (01/08/2023)   Hunger Vital Sign    Worried About Running Out of Food in the Last Year: Never true    Ran Out of Food in the Last Year: Never true  Transportation Needs: No Transportation Needs (01/08/2023)   PRAPARE - Administrator, Civil Service (Medical): No    Lack of Transportation (Non-Medical): No  Physical Activity: Sufficiently Active (01/08/2023)   Exercise Vital Sign    Days of Exercise per Week: 7 days    Minutes of Exercise per Session: 30 min  Stress: No Stress Concern Present (01/08/2023)   [employer] of Occupational Health - Occupational Stress Questionnaire    Feeling of Stress : Not at all  Social Connections: Moderately Isolated (01/08/2023)   Social Connection and Isolation Panel [NHANES]    Frequency of Communication with Friends and Family: More than three times a week    Frequency of Social Gatherings with Friends and Family: Twice a week    Attends Religious Services: Never    Database administrator or Organizations: Patient unable to answer    Attends Banker Meetings: Never    Marital Status: Married  Catering manager Violence: Not At Risk (01/08/2023)   Humiliation, Afraid, Rape, and Kick questionnaire    Fear of Current or Ex-Partner: No    Emotionally Abused: No    Physically Abused: No    Sexually Abused: No    Family History  Problem Relation Age of Onset   Breast cancer Mother 7   Arthritis Mother    Cancer Mother    Depression Mother    Heart disease Mother    Hypertension Mother    Heart  attack Father    CAD Father        Diagnosed in early 40s   Arthritis Father    Hearing loss Father    Heart disease Father    Esophageal cancer Brother 7   Other Brother 24       neuroendocrine tumor   Alcohol abuse Brother    Breast cancer Maternal Grandmother        dx after 50   Diabetes Maternal Grandmother    Heart disease Maternal Grandmother    Hypertension Maternal Grandmother    Colon cancer Neg Hx    Stomach cancer  Neg Hx    Rectal cancer Neg Hx     ROS: no fevers or chills, productive cough, hemoptysis, dysphasia, odynophagia, melena, hematochezia, dysuria, hematuria, rash, seizure activity, orthopnea, PND, pedal edema, claudication. Remaining systems are negative.  Physical Exam:   Blood pressure 112/62, pulse 84, height 5\' 7"  (1.702 m), weight 255 lb 6.4 oz (115.8 kg), SpO2 97%.  General:  Well developed/well nourished in NAD Skin warm/dry Patient not depressed No peripheral clubbing Back-normal HEENT-normal/normal eyelids Neck supple/normal carotid upstroke bilaterally; no bruits; no JVD; no thyromegaly chest - CTA/ normal expansion CV - RRR/normal S1 and S2; no murmurs, rubs or gallops;  PMI nondisplaced Abdomen -NT/ND, no HSM, no mass, + bowel sounds, no bruit 2+ femoral pulses, no bruits Ext-no edema, chords, 2+ DP Neuro-grossly nonfocal  ECG -Mar 11, 2023-normal sinus rhythm with occasional PVC.  Personally reviewed  A/P  1 palpitations-symptoms have resolved.  No documented arrhythmias.  She feels as though this was related to congestion secondary to inability to cough.  We will consider further evaluation if her symptoms worsen in the future such as a monitor and echocardiogram.  2 hypertension-blood pressure controlled.  Continue present medical regimen and follow.  3 chest pain-clearly musculoskeletal given description.  Increased with cough, sneezing and certain movements.  Electrocardiogram showed no ST changes.  Will not pursue further  ischemia evaluation.  4 hyperlipidemia-no documented vascular disease.  Would continue efforts at diet.  Olga Millers, MD

## 2023-05-17 ENCOUNTER — Ambulatory Visit: Payer: Medicare HMO | Attending: Cardiology | Admitting: Cardiology

## 2023-05-17 ENCOUNTER — Encounter: Payer: Self-pay | Admitting: Cardiology

## 2023-05-17 VITALS — BP 112/62 | HR 84 | Ht 67.0 in | Wt 255.4 lb

## 2023-05-17 DIAGNOSIS — R072 Precordial pain: Secondary | ICD-10-CM

## 2023-05-17 DIAGNOSIS — I10 Essential (primary) hypertension: Secondary | ICD-10-CM

## 2023-05-17 DIAGNOSIS — R002 Palpitations: Secondary | ICD-10-CM | POA: Diagnosis not present

## 2023-05-17 NOTE — Patient Instructions (Signed)
    Follow-Up: At [facility], you and your health needs are our priority.  As part of our continuing mission to provide you with exceptional heart care, we have created designated Provider Care Teams.  These Care Teams include your primary Cardiologist (physician) and Advanced Practice Providers (APPs -  Physician Assistants and Nurse Practitioners) who all work together to provide you with the care you need, when you need it.  We recommend signing up for the patient portal called "MyChart".  Sign up information is provided on this After Visit Summary.  MyChart is used to connect with patients for Virtual Visits (Telemedicine).  Patients are able to view lab/test results, encounter notes, upcoming appointments, etc.  Non-urgent messages can be sent to your provider as well.   To learn more about what you can do with MyChart, go to [email or web address].    Your next appointment:   As needed        

## 2023-07-02 ENCOUNTER — Ambulatory Visit: Payer: Medicare HMO | Admitting: Family Medicine

## 2023-07-02 VITALS — BP 138/70 | Ht 67.0 in | Wt 250.0 lb

## 2023-07-02 DIAGNOSIS — M1710 Unilateral primary osteoarthritis, unspecified knee: Secondary | ICD-10-CM | POA: Diagnosis not present

## 2023-07-02 MED ORDER — METHYLPREDNISOLONE ACETATE 40 MG/ML IJ SUSP
40.0000 mg | Freq: Once | INTRAMUSCULAR | Status: AC
  Administered 2023-07-02: 40 mg via INTRA_ARTICULAR

## 2023-07-03 ENCOUNTER — Encounter: Payer: Self-pay | Admitting: Family Medicine

## 2023-07-03 NOTE — Assessment & Plan Note (Signed)
Discussed.  - She has traditionally received excellent relief from CSI.  - We have no recent imaging but as long at she is receiving relief and remains on relatively sparse (3-5 x inj per knee per year) she does not want to consider surgical intervention and I agree with this.  B CSI today.  -Encouraged to return to continued walking for activity. She has been pretty successful with this long term.

## 2023-07-03 NOTE — Progress Notes (Signed)
  Kendra Thompson - 72 y.o. female MRN [id number]  Date of birth: 1951/08/08    SUBJECTIVE:      Chief Complaint:/ HPI:   B knee pain worsening significantly in last 4 weeks. Previously CSI have helped her with her pain a lot and she would like to consider those today in both knees if possible  Her Mother-in-law just passed away and she  has been very busy assisting her husband with affiars related to that and thus on her feet more. She thinks this might be contributing to recent pain increase. She is trying very hard to continue her walking program and at this point is becoming limited in ability to do that secondary to knee pain.    OBJECTIVE: BP 138/70   Ht 5\' 7"  (1.702 m)   Wt 250 lb (113.4 kg)   BMI 39.16 kg/m   Physical Exam:  Vital signs are reviewed. GEN WD WN NAD Knees: Bilaterally there is no sign of effusion. Some mild crepitus noted B and has full extension. Patellofemoral grind positive. Ligamentously intact.   PROCEDURE: INJECTION: Patient was given informed consent, signed copy in the chart. Appropriate time out was taken. Area prepped and draped in usual sterile fashion. Ethyl chloride was  used for local anesthesia. A 21 gauge 1 1/2 inch needle was used.. 1 cc of methylprednisolone 40 mg/ml plus  1 cc of 1% lidocaine without epinephrine was injected into the bilateral knees using a(n) anterior medial approach.   The patient tolerated the procedure well. There were no complications. Post procedure instructions were given.  ASSESSMENT & PLAN:  See problem based charting & AVS for pt instructions. Arthritis of knee, bilateral, chronic Discussed.  - She has traditionally received excellent relief from CSI.  - We have no recent imaging but as long at she is receiving relief and remains on relatively sparse (3-5 x inj per knee per year) she does not want to consider surgical intervention and I agree with this.  B CSI today.  -Encouraged to return to continued walking  for activity. She has been pretty successful with this long term.

## 2023-07-23 ENCOUNTER — Ambulatory Visit
Admission: RE | Admit: 2023-07-23 | Discharge: 2023-07-23 | Disposition: A | Discharge status: Home / Self Care | Payer: Medicare HMO | Source: Ambulatory Visit | Attending: Hematology and Oncology | Admitting: Hematology and Oncology

## 2023-07-23 DIAGNOSIS — E349 Endocrine disorder, unspecified: Secondary | ICD-10-CM | POA: Diagnosis not present

## 2023-07-23 DIAGNOSIS — N958 Other specified menopausal and perimenopausal disorders: Secondary | ICD-10-CM | POA: Diagnosis not present

## 2023-07-23 DIAGNOSIS — D0512 Intraductal carcinoma in situ of left breast: Secondary | ICD-10-CM

## 2023-07-23 DIAGNOSIS — M8588 Other specified disorders of bone density and structure, other site: Secondary | ICD-10-CM | POA: Diagnosis not present

## 2023-07-23 DIAGNOSIS — Z853 Personal history of malignant neoplasm of breast: Secondary | ICD-10-CM | POA: Diagnosis not present

## 2023-08-02 DIAGNOSIS — C50512 Malignant neoplasm of lower-outer quadrant of left female breast: Secondary | ICD-10-CM | POA: Diagnosis not present

## 2023-08-02 DIAGNOSIS — Z170 Estrogen receptor positive status [ER+]: Secondary | ICD-10-CM | POA: Diagnosis not present

## 2023-09-27 ENCOUNTER — Ambulatory Visit
Admission: RE | Admit: 2023-09-27 | Discharge: 2023-09-27 | Disposition: A | Discharge status: Home / Self Care | Payer: Medicare HMO | Source: Ambulatory Visit | Attending: Physician Assistant | Admitting: Physician Assistant

## 2023-09-27 ENCOUNTER — Ambulatory Visit (INDEPENDENT_AMBULATORY_CARE_PROVIDER_SITE_OTHER): Payer: Medicare HMO

## 2023-09-27 VITALS — BP 135/77 | HR 84 | Temp 97.8°F | Resp 18 | Ht 67.0 in | Wt 250.0 lb

## 2023-09-27 DIAGNOSIS — M2012 Hallux valgus (acquired), left foot: Secondary | ICD-10-CM | POA: Diagnosis not present

## 2023-09-27 DIAGNOSIS — M7732 Calcaneal spur, left foot: Secondary | ICD-10-CM | POA: Diagnosis not present

## 2023-09-27 DIAGNOSIS — M79672 Pain in left foot: Secondary | ICD-10-CM

## 2023-09-27 DIAGNOSIS — M19072 Primary osteoarthritis, left ankle and foot: Secondary | ICD-10-CM | POA: Diagnosis not present

## 2023-09-27 MED ORDER — PREDNISONE 20 MG PO TABS: 40.0000 mg | ORAL_TABLET | Freq: Every day | ORAL | 0 refills | Status: AC

## 2023-09-27 NOTE — ED Provider Notes (Signed)
[facility]    CSN: [id number] Arrival date & time: 09/27/23  0840      History   Chief Complaint Chief Complaint  Patient presents with   Foot Pain    Entered by patient    HPI Kendra Thompson is a 72 y.o. female.   Patient here today for evaluation of left foot pain that started over the last couple weeks.  She reports that it seemed to get worse with time.  He she notes that pain is present to the lateral aspect of her foot and she thinks might be due to her change in step due to bunion on the inner aspect of her foot.  She denies any numbness or tingling.  She has taken Advil without resolution.  The history is provided by the patient.  Foot Pain    Past Medical History:  Diagnosis Date   Allergy 1971   Asthma 1971   has not been present for several years   Breast cancer (HCC) 04/18/2021   Cataract 2020   Family history of breast cancer 04/30/2021   Headache    Migraines occasionally   Hypertension    Seasonal allergies    Wears glasses     Patient Active Problem List   Diagnosis Date Noted   Well adult health check 04/21/2023   Palpitations 03/11/2023   Arthritis of knee, bilateral, chronic 11/27/2022   Lymphedema of breast 06/16/2021   Family history of breast cancer 04/30/2021   Malignant neoplasm of lower-outer quadrant of left breast of female, estrogen receptor positive (HCC) 04/30/2021   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   Postmenopausal 12/29/2019   Gastroesophageal reflux disease without esophagitis 12/29/2019   Degenerative arthritis of thumb 10/01/2017   Chronic pain of both knees 09/11/2016   Obesity 08/03/2015   History of colon polyps 06/20/2012   Asthma 08/01/2010   HYPERGLYCEMIA, BORDERLINE 06/14/2008   DE QUERVAIN'S TENOSYNOVITIS, LEFT WRIST 07/18/2007   Essential hypertension 06/16/2007    Past Surgical History:  Procedure Laterality Date   BREAST LUMPECTOMY     BREAST LUMPECTOMY WITH  RADIOACTIVE SEED LOCALIZATION Left 05/20/2021   Procedure: LEFT BREAST BRACKETED LUMPECTOMY WITH RADIOACTIVE SEED LOCALIZATION x3;  Surgeon: Almond Lint, MD;  Location: [facility] OR;  Service: General;  Laterality: Left;   CLOSED REDUCTION FINGER WITH PERCUTANEOUS PINNING Left 07/25/2013   Procedure: CLOSED REDUCTION FINGER WITH PERCUTANEOUS PINNING LEFT SMALL METACARPAL;  Surgeon: Tami Ribas, MD;  Location: [facility];  Service: Orthopedics;  Laterality: Left;   COLONOSCOPY  2013,2014   DIAGNOSTIC LAPAROSCOPY  1992   explor-   DILATION AND CURETTAGE OF UTERUS     FRACTURE SURGERY  2014   HAND SURGERY  02/2020    OB History   No obstetric history on file.      Home Medications    Prior to Admission medications   Medication Sig Start Date End Date Taking? Authorizing Provider  amLODipine (NORVASC) 10 MG tablet TAKE 1 TABLET BY MOUTH EVERY DAY 01/19/23  Yes Nestor Ramp, MD  anastrozole (ARIMIDEX) 1 MG tablet Take 1 tablet (1 mg total) by mouth daily. Start 08/26/2021 08/21/22  Yes Rachel Moulds, MD  Candesartan Cilexetil-HCTZ 32-25 MG TABS TAKE 1 TABLET BY MOUTH EVERY DAY 04/23/23  Yes Nestor Ramp, MD  cyclobenzaprine (FLEXERIL) 5 MG tablet Take 1 tablet (5 mg total) by mouth 3 (three) times daily as needed for muscle spasms. 02/26/23  Yes Nestor Ramp,  MD  diclofenac Sodium (VOLTAREN) 1 % GEL Apply 2 g topically 4 (four) times daily.   Yes [provider]  gabapentin (NEURONTIN) 300 MG capsule Take 1 capsule (300 mg total) by mouth at bedtime. 10/27/22  Yes Rachel Moulds, MD  ibuprofen (ADVIL) 200 MG tablet Take 200-600 mg by mouth every 6 (six) hours as needed for headache or moderate pain.   Yes [provider]  pantoprazole (PROTONIX) 40 MG tablet TAKE 1 TABLET BY MOUTH EVERY DAY 01/26/23  Yes Nestor Ramp, MD  predniSONE (DELTASONE) 20 MG tablet Take 2 tablets (40 mg total) by mouth daily with breakfast for 5 days. 09/27/23 10/02/23 Yes Tomi Bamberger,  PA-C  benzonatate (TESSALON) 100 MG capsule Take 1 capsule (100 mg total) by mouth every 8 (eight) hours as needed for cough. 01/20/23   Gustavus Bryant, FNP  fexofenadine (ALLEGRA) 180 MG tablet Take 180 mg by mouth daily as needed for allergies or rhinitis.    [provider]  gabapentin (NEURONTIN) 100 MG capsule Take 100 mg by mouth daily as needed.    [provider]  MULTIPLE VITAMIN PO Take 1 tablet by mouth daily.    [provider]  traMADol Janean Sark) 50 MG tablet Take one or two by mouth every 8 hours as needed for pain Patient not taking: Reported on 05/17/2023 03/01/23   Nestor Ramp, MD    Family History Family History  Problem Relation Age of Onset   Breast cancer Mother 67   Arthritis Mother    Cancer Mother    Depression Mother    Heart disease Mother    Hypertension Mother    Heart attack Father    CAD Father        Diagnosed in early 19s   Arthritis Father    Hearing loss Father    Heart disease Father    Esophageal cancer Brother 79   Other Brother 61       neuroendocrine tumor   Alcohol abuse Brother    Breast cancer Maternal Grandmother        dx after 50   Diabetes Maternal Grandmother    Heart disease Maternal Grandmother    Hypertension Maternal Grandmother    Colon cancer Neg Hx    Stomach cancer Neg Hx    Rectal cancer Neg Hx     Social History Social History   Tobacco Use   Smoking status: Never    Passive exposure: Never   Smokeless tobacco: Never  Vaping Use   Vaping status: Never Used  Substance Use Topics   Alcohol use: Yes    Comment: very rarely   Drug use: Never     Allergies   Latex and Tape   Review of Systems Review of Systems  Constitutional:  Negative for chills and fever.  Eyes:  Negative for discharge and redness.  Gastrointestinal:  Negative for nausea and vomiting.  Musculoskeletal:  Negative for joint swelling.  Skin:  Negative for color change and wound.  Neurological:  Negative for  numbness.     Physical Exam Triage Vital Signs ED Triage Vitals  Encounter Vitals Group     BP 09/27/23 0907 135/77     Systolic BP Percentile --      Diastolic BP Percentile --      Pulse Rate 09/27/23 0907 84     Resp 09/27/23 0907 18     Temp 09/27/23 0907 97.8 F (36.6 C)  Temp Source 09/27/23 0907 Oral     SpO2 09/27/23 0907 96 %     Weight 09/27/23 0904 250 lb (113.4 kg)     Height 09/27/23 0904 5\' 7"  (1.702 m)     Head Circumference --      Peak Flow --      Pain Score 09/27/23 0901 5     Pain Loc --      Pain Education --      Exclude from Growth Chart --    No data found.  Updated Vital Signs BP 135/77 (BP Location: Left Arm)   Pulse 84   Temp 97.8 F (36.6 C) (Oral)   Resp 18   Ht 5\' 7"  (1.702 m)   Wt 250 lb (113.4 kg)   SpO2 96%   BMI 39.16 kg/m   Visual Acuity Right Eye Distance:   Left Eye Distance:   Bilateral Distance:    Right Eye Near:   Left Eye Near:    Bilateral Near:     Physical Exam Vitals and nursing note reviewed.  Constitutional:      General: She is not in acute distress.    Appearance: Normal appearance. She is not ill-appearing.  HENT:     Head: Normocephalic and atraumatic.  Eyes:     Conjunctiva/sclera: Conjunctivae normal.  Cardiovascular:     Rate and Rhythm: Normal rate.  Pulmonary:     Effort: Pulmonary effort is normal. No respiratory distress.  Musculoskeletal:     Comments: Full range of motion of left ankle and toes.  Bunion noted to left first MTP area. Normal coloration of left foot  Skin:    Capillary Refill: Normal cap refill to left toes Neurological:     Mental Status: She is alert.     Comments: Gross sensation intact to distal left toes  Psychiatric:        Mood and Affect: Mood normal.        Behavior: Behavior normal.        Thought Content: Thought content normal.      UC Treatments / Results  Labs (all labs ordered are listed, but only abnormal results are displayed) Labs Reviewed -  No data to display  EKG   Radiology DG Foot Complete Left  Result Date: 09/27/2023 CLINICAL DATA:  Foot pain.  Outside of foot hurting with walking. EXAM: LEFT FOOT - COMPLETE 3+ VIEW COMPARISON:  None Available. FINDINGS: There are no signs of acute fracture or dislocation. Moderate hallux valgus deformity identified with degenerative changes at the first MTP joint. Plantar heel spur identified. IMPRESSION: 1. No acute findings. 2. Moderate hallux valgus deformity and first MTP joint osteoarthritis. 3. Plantar heel spur. Electronically Signed   By: Signa Kell M.D.   On: 09/27/2023 09:32    Procedures Procedures (including critical care time)  Medications Ordered in UC Medications - No data to display  Initial Impression / Assessment and Plan / UC Course  I have reviewed the triage vital signs and the nursing notes.  Pertinent labs & imaging results that were available during my care of the patient were reviewed by me and considered in my medical decision making (see chart for details).    X-ray without acute findings.  Suspect most likely inflammatory cause of symptoms.  Will treat with steroid burst and recommended follow-up with ortho if no gradual improvement or with any further concerns.  Final Clinical Impressions(s) / UC Diagnoses   Final diagnoses:  Left foot pain  Discharge Instructions        Please follow up with ortho if no improvement     ED Prescriptions     Medication Sig Dispense Auth. Provider   predniSONE (DELTASONE) 20 MG tablet Take 2 tablets (40 mg total) by mouth daily with breakfast for 5 days. 10 tablet Tomi Bamberger, PA-C      PDMP not reviewed this encounter.   Tomi Bamberger, PA-C 09/27/23 1002

## 2023-09-27 NOTE — ED Triage Notes (Signed)
"  I have a problem with my left foot, it got really bad this past week or two, the outside of my foot is hurting a lot due to me walking differently because of the pain from the bunion".

## 2023-09-27 NOTE — Discharge Instructions (Signed)
   Please follow up with ortho if no improvement

## 2023-10-08 ENCOUNTER — Ambulatory Visit: Payer: Medicare HMO | Admitting: Family Medicine

## 2023-10-08 ENCOUNTER — Encounter: Payer: Self-pay | Admitting: Family Medicine

## 2023-10-08 VITALS — BP 138/61 | Ht 67.0 in | Wt 250.0 lb

## 2023-10-08 DIAGNOSIS — M170 Bilateral primary osteoarthritis of knee: Secondary | ICD-10-CM | POA: Diagnosis not present

## 2023-10-08 DIAGNOSIS — M7742 Metatarsalgia, left foot: Secondary | ICD-10-CM

## 2023-10-08 DIAGNOSIS — M21612 Bunion of left foot: Secondary | ICD-10-CM

## 2023-10-08 MED ORDER — METHYLPREDNISOLONE ACETATE 40 MG/ML IJ SUSP
40.0000 mg | Freq: Once | INTRAMUSCULAR | Status: AC
  Administered 2023-10-08: 40 mg via INTRA_ARTICULAR

## 2023-10-08 NOTE — Progress Notes (Unsigned)
  Kendra Thompson - 72 y.o. female MRN [id number]  Date of birth: 1951-10-24    SUBJECTIVE:      Chief Complaint:/ HPI:   1. L forefoot pain on standing/walking. Knows she has a bunion on that foot and does not wantto have surgery if she can help it. Feels bunion is making her walk differently (more onlateral side of foot). Recently had an acute problem with lateral foot pain but that is recolving. We made her custom orthotics several years ago and they do not fit as well now. Initially they were helpful.  2. Bilateral knees are hurting again, especially with stair climbing. CSI still helpful. Last shot about 3-4 months ago. Would like repeat injections today PERTINENT  PMH / PSH: I have reviewed the patient's medications, allergies, past medical and surgical history, smoking status.  Pertinent findings that relate to today's visit / issues include: Reviewed her x rays of foot from Urgent care 09/27/2023. Small radiopaque  rounded fragment next to the 5th ray. Within the shaft of the 5th ray os an indistinct small area of radiolucency. (Donor area?)     OBJECTIVE: There were no vitals taken for this visit.  Physical Exam:  Vital signs are reviewed. GEN WD WNNAD Feet: Left foot first ray bunion, almost total loss of transverse arch. TTP plantar metatarsal area and also point tender over spot on lateral mid-ray #5. KNEES: stiffness with extension and lacks a few degrees of full extension on right. Some synovial hypertrophy noted both knees. No effusion. No redness, no unusual warmth.TTP bilateral medial joint line and additionally right lateral joint line.  PROCEDURE: INJECTION: Patient was given informed consent, signed copy in the chart. Appropriate time out was taken. Area prepped and draped in usual sterile fashion. Ethyl chloride was  used for local anesthesia. A 21 gauge 1 1/2 inch needle was used.. 1 cc of methylprednisolone 40 mg/ml plus  4 cc of 1% lidocaine without epinephrine was  injected into the bilateral knees using a(n) anterior medial approach.   The patient tolerated the procedure well. There were no complications. Post procedure instructions were given.   ASSESSMENT & PLAN:  See problem based charting & AVS for pt instructions. No problem-specific Assessment & Plan notes found for this encounter.

## 2023-10-09 DIAGNOSIS — M21612 Bunion of left foot: Secondary | ICD-10-CM | POA: Insufficient documentation

## 2023-10-09 DIAGNOSIS — M7742 Metatarsalgia, left foot: Secondary | ICD-10-CM | POA: Insufficient documentation

## 2023-10-09 NOTE — Assessment & Plan Note (Signed)
Multifactorial;. Bunion and loss of  transverse arch. Temp insole with medium MT pad. Id this works, rtc and either bring old orthotics to see if we can adjust them or we may need to consider new custom molded orthotics.

## 2023-10-09 NOTE — Assessment & Plan Note (Signed)
Recent increase in pain. Bilateral CSI today. F/u prn. She wants to avoid TKR if at all possible.

## 2023-10-09 NOTE — Assessment & Plan Note (Signed)
>>  ASSESSMENT AND PLAN FOR ARTHRITIS OF BOTH KNEES WRITTEN ON 10/09/2023  2:56 PM BY Jonthan Leite L, MD  Recent increase in pain. Bilateral CSI today. F/u prn. She wants to avoid TKR if at all possible.

## 2023-11-19 ENCOUNTER — Ambulatory Visit: Payer: Medicare HMO | Admitting: Family Medicine

## 2023-11-26 ENCOUNTER — Other Ambulatory Visit: Payer: Self-pay | Admitting: Family Medicine

## 2023-11-26 ENCOUNTER — Other Ambulatory Visit: Payer: Self-pay | Admitting: Hematology and Oncology

## 2023-12-09 ENCOUNTER — Other Ambulatory Visit: Payer: Self-pay | Admitting: Hematology and Oncology

## 2023-12-09 DIAGNOSIS — Z1231 Encounter for screening mammogram for malignant neoplasm of breast: Secondary | ICD-10-CM

## 2023-12-28 ENCOUNTER — Ambulatory Visit: Admitting: Family Medicine

## 2023-12-28 ENCOUNTER — Encounter: Payer: Self-pay | Admitting: Family Medicine

## 2023-12-28 ENCOUNTER — Other Ambulatory Visit: Payer: Self-pay | Admitting: Family Medicine

## 2023-12-28 ENCOUNTER — Ambulatory Visit: Payer: Self-pay

## 2023-12-28 ENCOUNTER — Ambulatory Visit
Admission: RE | Admit: 2023-12-28 | Discharge: 2023-12-28 | Disposition: A | Discharge status: Home / Self Care | Source: Ambulatory Visit | Attending: Family Medicine | Admitting: Family Medicine

## 2023-12-28 VITALS — BP 138/62 | Ht 67.0 in | Wt 250.0 lb

## 2023-12-28 DIAGNOSIS — S86112A Strain of other muscle(s) and tendon(s) of posterior muscle group at lower leg level, left leg, initial encounter: Secondary | ICD-10-CM | POA: Insufficient documentation

## 2023-12-28 DIAGNOSIS — M1710 Unilateral primary osteoarthritis, unspecified knee: Secondary | ICD-10-CM

## 2023-12-28 DIAGNOSIS — M170 Bilateral primary osteoarthritis of knee: Secondary | ICD-10-CM | POA: Diagnosis not present

## 2023-12-28 DIAGNOSIS — S76312A Strain of muscle, fascia and tendon of the posterior muscle group at thigh level, left thigh, initial encounter: Secondary | ICD-10-CM | POA: Insufficient documentation

## 2023-12-28 MED ORDER — METHYLPREDNISOLONE ACETATE 40 MG/ML IJ SUSP
40.0000 mg | Freq: Once | INTRAMUSCULAR | Status: AC
  Administered 2023-12-28: 40 mg via INTRA_ARTICULAR

## 2023-12-28 MED ORDER — MELOXICAM 15 MG PO TABS: ORAL_TABLET | ORAL | 0 refills | Status: DC

## 2023-12-28 NOTE — Progress Notes (Signed)
 X-rays reviewed.  Patient does have advancement of osteoarthritis of both knees.  Has moderate medial and patellofemoral compartment OA.  Worse when compared to images from 2017.  Should continue with treatment plan as discussed in visit today.

## 2023-12-28 NOTE — Assessment & Plan Note (Addendum)
-   Ultrasound indicating strain of the distal biceps femoris - Plan as above.

## 2023-12-28 NOTE — Assessment & Plan Note (Addendum)
-   The patient previously received lengthy benefit from the corticosteroid injections.  Given that her last several have been from 3-6 months we will get repeat x-rays to compare to her last in 2017. - Bilateral steroid injections given today as below.  Postprocedural instructions discussed and printed materials given - Repeat Xrays ordered - She will start PT for quadriceps strengthening  - Follow-up in 6 weeks  Procedure:  Intra articular corticosteroid injection of the Right Knee Risks and benefits of procedure were discussed with patient. After informed written consent was obtained, timeout was performed, patient was seated on exam table.  The right knee was prepped with alcohol swab and utilizing anterior lateral approach, patient's right knee was injected intraarticularly with 3:1 lidocaine:depomedrol. Patient tolerated the procedure well without immediate complications.   Procedure:  Intra articular corticosteroid injection of the Left Knee Risks and benefits of procedure were discussed with patient. After informed written consent was obtained, timeout was performed, patient was seated on exam table.  Left knee was prepped with alcohol swab and utilizing anterior lateral approach, patient's left knee was injected intraarticularly with 3:1 lidocaine:depomedrol. Patient tolerated the procedure well without immediate complications.

## 2023-12-28 NOTE — Progress Notes (Signed)
 Kendra Thompson - 73 y.o. female MRN [id number]  Date of birth: Sep 12, 1951  PCP: Nestor Ramp, MD  Subjective:  No chief complaint on file. Bilateral knee pain and acute left posterior knee pain  HPI: Past Medical, Surgical, Social, and Family History Reviewed & Updated per EMR.   Patient is a 73 y.o. female here for follow up on bilateral knee pain from arthritis.  She was last seen and injected in September 2024.  This gave her minimal relief.  She has remained active but 1 week ago felt a pop with pain in the back of her left knee while walking uphill.  There is no bruising or swelling but there was and not palpable.  Since that time does not has decreased in size and gotten softer.  She continues to have pain in the back of the calf when walking.  She has used heat but no oral medications or stretching.  She denies any numbness or weakness distally.  She has been walking less due to pain since this happened and has not been doing other forms of exercise.  Past Medical History:  Diagnosis Date   Allergy 1971   Asthma 1971   has not been present for several years   Breast cancer (HCC) 04/18/2021   Cataract 2020   Family history of breast cancer 04/30/2021   Headache    Migraines occasionally   Hypertension    Seasonal allergies    Wears glasses     Current Outpatient Medications on File Prior to Visit  Medication Sig Dispense Refill   amLODipine (NORVASC) 10 MG tablet TAKE 1 TABLET BY MOUTH EVERY DAY 90 tablet 2   anastrozole (ARIMIDEX) 1 MG tablet TAKE 1 TABLET (1 MG TOTAL) BY MOUTH DAILY. START 08/26/2021 90 tablet 4   benzonatate (TESSALON) 100 MG capsule Take 1 capsule (100 mg total) by mouth every 8 (eight) hours as needed for cough. 21 capsule 0   Candesartan Cilexetil-HCTZ 32-25 MG TABS TAKE 1 TABLET BY MOUTH EVERY DAY 90 tablet 3   cyclobenzaprine (FLEXERIL) 5 MG tablet Take 1 tablet (5 mg total) by mouth 3 (three) times daily as needed for muscle spasms. 30 tablet 0    diclofenac Sodium (VOLTAREN) 1 % GEL Apply 2 g topically 4 (four) times daily.     fexofenadine (ALLEGRA) 180 MG tablet Take 180 mg by mouth daily as needed for allergies or rhinitis.     gabapentin (NEURONTIN) 100 MG capsule Take 100 mg by mouth daily as needed.     gabapentin (NEURONTIN) 300 MG capsule TAKE 1 CAPSULE BY MOUTH EVERYDAY AT BEDTIME 90 capsule 4   ibuprofen (ADVIL) 200 MG tablet Take 200-600 mg by mouth every 6 (six) hours as needed for headache or moderate pain.     MULTIPLE VITAMIN PO Take 1 tablet by mouth daily.     pantoprazole (PROTONIX) 40 MG tablet TAKE 1 TABLET BY MOUTH EVERY DAY 90 tablet 2   No current facility-administered medications on file prior to visit.    Past Surgical History:  Procedure Laterality Date   BREAST LUMPECTOMY     BREAST LUMPECTOMY WITH RADIOACTIVE SEED LOCALIZATION Left 05/20/2021   Procedure: LEFT BREAST BRACKETED LUMPECTOMY WITH RADIOACTIVE SEED LOCALIZATION x3;  Surgeon: Almond Lint, MD;  Location: [facility] OR;  Service: General;  Laterality: Left;   CLOSED REDUCTION FINGER WITH PERCUTANEOUS PINNING Left 07/25/2013   Procedure: CLOSED REDUCTION FINGER WITH PERCUTANEOUS PINNING LEFT SMALL METACARPAL;  Surgeon: Tami Ribas, MD;  Location: [facility];  Service: Orthopedics;  Laterality: Left;   COLONOSCOPY  2013,2014   DIAGNOSTIC LAPAROSCOPY  1992   explor-   DILATION AND CURETTAGE OF UTERUS     FRACTURE SURGERY  2014   HAND SURGERY  02/2020    Allergies  Allergen Reactions   Latex     rash   Tape Rash        Objective:  Physical Exam: VS: BP:138/62  HR: bpm  TEMP: ( )  RESP:   HT:5\' 7"  (170.2 cm)   WT:250 lb (113.4 kg)  BMI:39.15  Gen: NAD, speaks clearly, comfortable in exam room Respiratory: Normal respiratory effort on room air. No signs of distress Skin: No rashes, abrasions, or ecchymosis MSK:  Bilateral knee: Inspection: No obvious deformity, there is some decreased muscle mass of the VMO bilaterally  and quadriceps muscles in general ROM: Full with crepitation noted bilaterally Strength: 4/5 in extension and flexion Palpation w/ no warmth, no effusion present, mild lateral posterior tenderness on the left with a palpable nodule no joint line tenderness to palpation  no tenderness over Gerdy's tubercle, pes bursa, or tibial tuberosity no patellar tenderness, translation nontender Special Tests: Varus valgus stress test negative Collateral ligaments: Lachman's negative Meniscus: McMurray's negative Posterior Rotation Recurvatum Test (posterolateral capsule instability) negative Homans' sign negative  Left calf Evaluation Normal attachment of the biceps femoris into the fibular head with hypoechoic changes within the distal tendon fibers Attachment of the lateral gastrocnemius to the posterior femur appears intact but with hyperechoic changes on long and short axis views just distal to the proximal attachment as well as the proximal muscle belly.   Summary: Strain of the distal biceps femoris and proximal lateral gastrocnemius muscle  Ultrasound and interpretation by Dr. Webb Silversmith and Dr. Christella Hartigan      Assessment & Plan:   Arthritis of knee, bilateral, chronic - The patient previously received lengthy benefit from the corticosteroid injections.  Given that her last several have been from 3-6 months we will get repeat x-rays to compare to her last in 2017. - Bilateral steroid injections given today as below.  Postprocedural instructions discussed and printed materials given - Repeat Xrays ordered - She will start PT for quadriceps strengthening  - Follow-up in 6 weeks  Procedure:  Intra articular corticosteroid injection of the Right Knee Risks and benefits of procedure were discussed with patient. After informed written consent was obtained, timeout was performed, patient was seated on exam table.  The right knee was prepped with alcohol swab and utilizing anterior lateral approach,  patient's right knee was injected intraarticularly with 3:1 lidocaine:depomedrol. Patient tolerated the procedure well without immediate complications.   Procedure:  Intra articular corticosteroid injection of the Left Knee Risks and benefits of procedure were discussed with patient. After informed written consent was obtained, timeout was performed, patient was seated on exam table.  Left knee was prepped with alcohol swab and utilizing anterior lateral approach, patient's left knee was injected intraarticularly with 3:1 lidocaine:depomedrol. Patient tolerated the procedure well without immediate complications.     Gastrocnemius strain, left, initial encounter - Ultrasound showing strain of the proximal lateral gastrocnemius.  No Baker's cyst present. - Body helix compression sleeve given today. - The patient will start icing nightly and after activities - Formal physical therapy pending - She will start a short, 1 week course, of meloxicam in 3-4 days given her steroid injections.  Hopefully these will also give her some anti-inflammatory relief. - Follow-up in  6 weeks  Hamstring strain, left, initial encounter - Ultrasound indicating strain of the distal biceps femoris - Plan as above.    Rica Mote MD [facility] Fellow

## 2023-12-28 NOTE — Patient Instructions (Signed)
 Today you received an injection with a corticosteroid. This injection is usually done in response to pain and inflammation. There is some "numbing medicine" (Lidocaine) in the shot, so the injected area may be numb and feel really good for the next couple of hours. The numbing medicine usually wears off in 2-3 hours, and then your pain level may be back to where it was before the injection until the steroid starts working.    You may actually experience a small (as in 10%) INCREASE in pressure sensation in the first 24 hours---that is common.  Things to watch out for that you should contact us or a health care provider urgently would include: 1. Unusual (as in more than 10%) increase in pain 2. New fever > 101.5 3. New swelling or redness of the injected area. 4. Streaking of red lines around the area injected.  Do not hesitate to call or reach out with any questions or concerns.  Start physical therapy and make sure to do your exercises at home We will start a short course of meloxicam for 1 week Wait to start this until 3-4 days Ice the back of the left knee nightly and after activities

## 2023-12-28 NOTE — Assessment & Plan Note (Addendum)
-   Ultrasound showing strain of the proximal lateral gastrocnemius.  No Baker's cyst present. - Body helix compression sleeve given today. - The patient will start icing nightly and after activities - Formal physical therapy pending - She will start a short, 1 week course, of meloxicam in 3-4 days given her steroid injections.  Hopefully these will also give her some anti-inflammatory relief. - Follow-up in 6 weeks

## 2023-12-31 ENCOUNTER — Ambulatory Visit: Payer: Medicare HMO | Admitting: Family Medicine

## 2024-01-06 DIAGNOSIS — M25562 Pain in left knee: Secondary | ICD-10-CM | POA: Diagnosis not present

## 2024-01-06 DIAGNOSIS — S76912A Strain of unspecified muscles, fascia and tendons at thigh level, left thigh, initial encounter: Secondary | ICD-10-CM | POA: Diagnosis not present

## 2024-01-12 DIAGNOSIS — M25562 Pain in left knee: Secondary | ICD-10-CM | POA: Diagnosis not present

## 2024-01-12 DIAGNOSIS — S76912A Strain of unspecified muscles, fascia and tendons at thigh level, left thigh, initial encounter: Secondary | ICD-10-CM | POA: Diagnosis not present

## 2024-01-13 ENCOUNTER — Ambulatory Visit
Admission: RE | Admit: 2024-01-13 | Discharge: 2024-01-13 | Disposition: A | Discharge status: Home / Self Care | Payer: Medicare HMO | Source: Ambulatory Visit | Attending: Hematology and Oncology

## 2024-01-13 DIAGNOSIS — Z1231 Encounter for screening mammogram for malignant neoplasm of breast: Secondary | ICD-10-CM

## 2024-01-14 ENCOUNTER — Telehealth: Payer: Self-pay

## 2024-01-14 NOTE — Telephone Encounter (Signed)
 Spoke with patient and confirmed appointment on 01/17/24

## 2024-01-15 ENCOUNTER — Encounter: Payer: Self-pay | Admitting: Family Medicine

## 2024-01-17 ENCOUNTER — Encounter: Payer: Self-pay | Admitting: Hematology and Oncology

## 2024-01-17 ENCOUNTER — Inpatient Hospital Stay: Payer: Medicare HMO | Attending: Hematology and Oncology | Admitting: Hematology and Oncology

## 2024-01-17 VITALS — BP 127/62 | HR 83 | Temp 98.3°F | Resp 16 | Wt 258.4 lb

## 2024-01-17 DIAGNOSIS — D0512 Intraductal carcinoma in situ of left breast: Secondary | ICD-10-CM | POA: Diagnosis not present

## 2024-01-17 DIAGNOSIS — Z170 Estrogen receptor positive status [ER+]: Secondary | ICD-10-CM | POA: Insufficient documentation

## 2024-01-17 DIAGNOSIS — M8580 Other specified disorders of bone density and structure, unspecified site: Secondary | ICD-10-CM | POA: Insufficient documentation

## 2024-01-17 DIAGNOSIS — Z79811 Long term (current) use of aromatase inhibitors: Secondary | ICD-10-CM | POA: Diagnosis not present

## 2024-01-17 NOTE — Progress Notes (Signed)
 [facility]  [phone] [phone]     ID: Kendra Thompson DOB: [birth year]  MR#: [id number]  HYQ#:[id number]  Patient Care Team: Kendra Ramp, MD as PCP - General Kendra Lint, MD as Consulting Physician (General Surgery) Kendra Puffer, MD as Consulting Physician (Radiation Oncology) Thompson, Kendra Hue, MD (Inactive) as Consulting Physician (Oncology) Kendra Severin, MD as Consulting Physician (Orthopedic Surgery) Kendra Labrador, RN as Registered Nurse Thompson, Kendra Daughters, NP as Nurse Practitioner (Hematology and Oncology) Kendra Moulds, MD  CHIEF COMPLAINT: Noninvasive estrogen receptor positive breast cancer  CURRENT TREATMENT:   INTERVAL HISTORY:  Kendra Thompson is a 73 year old female with breast cancer who presents for follow-up after a mammogram showed concerning findings.  She presents for follow-up after a recent mammogram showed concerning findings, similar to those seen when she was previously diagnosed with breast cancer. The area of concern is in the same breast that was previously operated on. She has not yet been scheduled for a diagnostic mammogram, as she received the report late on Friday.  She is currently taking anastrozole and has been experiencing limitations in physical activity due to arthritis in her knees and a recent hamstring injury, which has affected her walking. She is trying to increase her activity as her condition improves. She spends at least 20 minutes a day in the sunlight but is not taking vitamin D supplements.  She is taking gabapentin, 300 mg at bedtime, for nerve pain resulting from a past hand fracture. She occasionally takes an additional 100 mg during the day if needed, but finds the 300 mg dose at night sufficient to manage her symptoms and help with sleep. No current hot flashes, which she attributes to the gabapentin.  Her bone density test showed mild osteopenia with a T score of  -1.2.  Rest of the pertinent 10 point ROS reviewed and negative  COVID 19 VACCINATION STATUS: Moderna x3   HISTORY OF CURRENT ILLNESS: From the original intake note:  Kendra Thompson had routine screening mammography on 04/03/2021 showing a possible abnormality in the left breast. She underwent left diagnostic mammography with tomography at [facility] on 04/11/2021 showing: breast density category B; indeterminate calcifications in lower-outer left breast, with largest area measuring 4.9 cm.  Accordingly on 04/18/2021 she proceeded to biopsy of the left breast area in question. The pathology from this procedure ([id number]) showed: ductal carcinoma in situ, intermediate grade. Prognostic indicators significant for: estrogen receptor, 95% positive and progesterone receptor, 80% positive, both with strong staining intensity.    Cancer Staging  Ductal carcinoma in situ (DCIS) of left breast Staging form: Breast, AJCC 8th Edition - Clinical stage from 04/30/2021: Stage 0 (cTis (DCIS), cN0, cM0, ER+, PR+) - Signed by Lowella Dell, MD on 04/30/2021 Stage prefix: Initial diagnosis Nuclear grade: G2   The patient's subsequent history is as detailed below.   PAST MEDICAL HISTORY: Past Medical History:  Diagnosis Date   Allergy 1971   Asthma 1971   has not been present for several years   Breast cancer (HCC) 04/18/2021   Cataract 2020   Family history of breast cancer 04/30/2021   Headache    Migraines occasionally   Hypertension    Seasonal allergies    Wears glasses     PAST SURGICAL HISTORY: Past Surgical History:  Procedure Laterality Date   BREAST LUMPECTOMY Left 05/20/2021   BREAST LUMPECTOMY WITH RADIOACTIVE SEED LOCALIZATION Left 05/20/2021   Procedure: LEFT BREAST BRACKETED  LUMPECTOMY WITH RADIOACTIVE SEED LOCALIZATION x3;  Surgeon: Kendra Lint, MD;  Location: [facility];  Service: General;  Laterality: Left;   CLOSED REDUCTION FINGER WITH PERCUTANEOUS PINNING Left  07/25/2013   Procedure: CLOSED REDUCTION FINGER WITH PERCUTANEOUS PINNING LEFT SMALL METACARPAL;  Surgeon: Tami Ribas, MD;  Location: [facility];  Service: Orthopedics;  Laterality: Left;   COLONOSCOPY  2013,2014   DIAGNOSTIC LAPAROSCOPY  1992   explor-   DILATION AND CURETTAGE OF UTERUS     FRACTURE SURGERY  2014   HAND SURGERY  02/2020    FAMILY HISTORY: Family History  Problem Relation Age of Onset   Breast cancer Mother 46   Arthritis Mother    Cancer Mother    Depression Mother    Heart disease Mother    Hypertension Mother    Heart attack Father    CAD Father        Diagnosed in early 59s   Arthritis Father    Hearing loss Father    Heart disease Father    Esophageal cancer Brother 78   Other Brother 75       neuroendocrine tumor   Alcohol abuse Brother    Breast cancer Maternal Grandmother        dx after 50   Diabetes Maternal Grandmother    Heart disease Maternal Grandmother    Hypertension Maternal Grandmother    Colon cancer Neg Hx    Stomach cancer Neg Hx    Rectal cancer Neg Hx   Her father died at age 73 from heart attack and COPD. Her mother died at age 46 from complications of breast cancer. She was initially diagnosed at age 23, had bilateral cancer, and recurred later in life. [name] has one brother and one sister. In addition to her mother, she reports breast cancer in her maternal grandmother (age unknown) and her sister ("pre-cancer") at age 66. She also reports cancer in her brother-- stomach at age 65 and neuroendocrine at age 57.   GYNECOLOGIC HISTORY:  No LMP recorded. Patient is postmenopausal. Menarche: 73 years old GX P 0 LMP ~age 35 Contraceptive: used only for one year in mid-20's. HRT never used  Hysterectomy? no BSO? 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 Husband Ray "Jodelle Red" is a retired IT sales professional; he was a Education officer, community. She is not a  Actor.    ADVANCED DIRECTIVES: in place   HEALTH MAINTENANCE: Social History   Tobacco Use   Smoking status: Never    Passive exposure: Never   Smokeless tobacco: Never  Vaping Use   Vaping status: Never Used  Substance Use Topics   Alcohol use: Yes    Comment: very rarely   Drug use: Never     Colonoscopy: 09/2017 (Dr. Russella Dar), recall 2023  PAP: 07/2015, negative  Bone density: 02/2020, -0.7   Allergies  Allergen Reactions   Latex     rash   Tape Rash    Current Outpatient Medications  Medication Sig Dispense Refill   amLODipine (NORVASC) 10 MG tablet TAKE 1 TABLET BY MOUTH EVERY DAY 90 tablet 2   anastrozole (ARIMIDEX) 1 MG tablet TAKE 1 TABLET (1 MG TOTAL) BY MOUTH DAILY. START 08/26/2021 90 tablet 4   benzonatate (TESSALON) 100 MG capsule Take 1 capsule (100 mg total) by mouth every 8 (eight) hours as needed for cough. 21 capsule 0   Candesartan Cilexetil-HCTZ 32-25 MG TABS TAKE  1 TABLET BY MOUTH EVERY DAY 90 tablet 3   cyclobenzaprine (FLEXERIL) 5 MG tablet Take 1 tablet (5 mg total) by mouth 3 (three) times daily as needed for muscle spasms. 30 tablet 0   diclofenac Sodium (VOLTAREN) 1 % GEL Apply 2 g topically 4 (four) times daily.     fexofenadine (ALLEGRA) 180 MG tablet Take 180 mg by mouth daily as needed for allergies or rhinitis.     gabapentin (NEURONTIN) 100 MG capsule Take 100 mg by mouth daily as needed.     gabapentin (NEURONTIN) 300 MG capsule TAKE 1 CAPSULE BY MOUTH EVERYDAY AT BEDTIME 90 capsule 4   ibuprofen (ADVIL) 200 MG tablet Take 200-600 mg by mouth every 6 (six) hours as needed for headache or moderate pain.     meloxicam (MOBIC) 15 MG tablet Take 1 tablet daily with food for 7 days. Then take as needed. 30 tablet 0   MULTIPLE VITAMIN PO Take 1 tablet by mouth daily.     pantoprazole (PROTONIX) 40 MG tablet TAKE 1 TABLET BY MOUTH EVERY DAY 90 tablet 2   No current facility-administered medications for this visit.    OBJECTIVE: White  woman in no acute distress  There were no vitals filed for this visit.      There is no height or weight on file to calculate BMI.   Wt Readings from Last 3 Encounters:  12/28/23 250 lb (113.4 kg)  10/08/23 250 lb (113.4 kg)  09/27/23 250 lb (113.4 kg)      ECOG FS:1 - Symptomatic but completely ambulatory  Physical Exam Constitutional:      Appearance: Normal appearance.  Chest:     Comments: Bilateral breasts inspected.  Left breast smaller secondary to surgery and postradiation changes.  No palpable masses.  No regional adenopathy Musculoskeletal:     Cervical back: Normal range of motion and neck supple. No rigidity.  Neurological:     Mental Status: She is alert.     LAB RESULTS:  CMP     Component Value Date/Time   NA 139 04/21/2023 1206   K 4.4 04/21/2023 1206   CL 97 04/21/2023 1206   CO2 24 04/21/2023 1206   GLUCOSE 119 (H) 04/21/2023 1206   GLUCOSE 124 (H) 04/30/2021 0754   BUN 17 04/21/2023 1206   CREATININE 0.88 04/21/2023 1206   CREATININE 0.93 04/30/2021 0754   CREATININE 0.76 09/09/2016 0928   CALCIUM 9.5 04/21/2023 1206   PROT 7.0 04/21/2023 1206   ALBUMIN 4.4 04/21/2023 1206   AST 18 04/21/2023 1206   AST 17 04/30/2021 0754   ALT 29 04/21/2023 1206   ALT 20 04/30/2021 0754   ALKPHOS 93 04/21/2023 1206   BILITOT 0.6 04/21/2023 1206   BILITOT 0.6 04/30/2021 0754   GFRNONAA >60 04/30/2021 0754   GFRNONAA 84 09/09/2016 0928   GFRAA 81 02/19/2020 0915   GFRAA >89 09/09/2016 0928    No results found for: "TOTALPROTELP", "ALBUMINELP", "A1GS", "A2GS", "BETS", "BETA2SER", "GAMS", "MSPIKE", "SPEI"  Lab Results  Component Value Date   WBC 8.2 03/11/2023   NEUTROABS 5.6 04/30/2021   HGB 13.0 03/11/2023   HCT 38.7 03/11/2023   MCV 88 03/11/2023   PLT 304 03/11/2023    No results found for: "LABCA2"  No components found for: "[id number]"  No results for input(s): "INR" in the last 168 hours.  No results found for: "LABCA2"  No results  found for: "GNF621"  No results found for: "CAN125"  No results found  for: "UEA540"  No results found for: "CA2729"  No components found for: "HGQUANT"  No results found for: "CEA1", "CEA" / No results found for: "CEA1", "CEA"   No results found for: "AFPTUMOR"  No results found for: "CHROMOGRNA"  No results found for: "KPAFRELGTCHN", "LAMBDASER", "KAPLAMBRATIO" (kappa/lambda light chains)  No results found for: "HGBA", "HGBA2QUANT", "HGBFQUANT", "HGBSQUAN" (Hemoglobinopathy evaluation)   No results found for: "LDH"  No results found for: "IRON", "TIBC", "IRONPCTSAT" (Iron and TIBC)  No results found for: "FERRITIN"  Urinalysis    Component Value Date/Time   COLORURINE yellow 07/30/2010 0816   APPEARANCEUR Clear 07/30/2010 0816   LABSPEC 1.025 02/03/2014 0947   PHURINE 6.0 02/03/2014 0947   GLUCOSEU NEGATIVE 02/03/2014 0947   HGBUR TRACE (A) 02/03/2014 0947   HGBUR trace-intact 07/30/2010 0816   BILIRUBINUR negative 11/01/2022 1100   KETONESUR negative 11/01/2022 1100   KETONESUR NEGATIVE 02/03/2014 0947   PROTEINUR trace (A) 11/01/2022 1100   PROTEINUR NEGATIVE 02/03/2014 0947   UROBILINOGEN 0.2 11/01/2022 1100   UROBILINOGEN 0.2 02/03/2014 0947   NITRITE Negative 11/01/2022 1100   NITRITE NEGATIVE 02/03/2014 0947   LEUKOCYTESUR Large (3+) (A) 11/01/2022 1100    STUDIES: MM 3D SCREENING MAMMOGRAM BILATERAL BREAST Result Date: 01/14/2024 CLINICAL DATA:  Screening. History of a LEFT lumpectomy for DCIS in 2022 EXAM: DIGITAL SCREENING BILATERAL MAMMOGRAM WITH TOMOSYNTHESIS AND CAD TECHNIQUE: Bilateral screening digital craniocaudal and mediolateral oblique mammograms were obtained. Bilateral screening digital breast tomosynthesis was performed. The images were evaluated with computer-aided detection. COMPARISON:  Previous exam(s). ACR Breast Density Category b: There are scattered areas of fibroglandular density. FINDINGS: In the left breast, calcifications warrant  further evaluation. In the right breast, no findings suspicious for malignancy. IMPRESSION: Further evaluation is suggested for calcifications in the left breast. RECOMMENDATION: Diagnostic mammogram of the left breast. (Code:[id number]-30M) The patient will be contacted regarding the findings, and additional imaging will be scheduled. BI-RADS CATEGORY  0: Incomplete: Need additional imaging evaluation. Electronically Signed   By: Meda Klinefelter M.D.   On: 01/14/2024 15:22   [place] LIMITED JOINT SPACE STRUCTURES LOW LEFT Result Date: 12/28/2023 MSK U/S Left calf Date: 12/28/2023 Indication: Left posterior knee/calf pain Findings: Normal attachment of the biceps femoris into the fibular head with hypoechoic changes within the distal tendon fibers Attachment of the lateral gastrocnemius to the posterior femur appears intact but with hyperechoic changes on long and short axis views just distal to the proximal attachment as well as the proximal muscle belly.  Summary: Strain of the distal biceps femoris and proximal lateral gastrocnemius muscle  Ultrasound and interpretation by Dr. Webb Silversmith and Dr. Cameron Ali Knee Complete 4 Views Right Result Date: 12/28/2023 CLINICAL DATA:  Knee pain EXAM: RIGHT KNEE - COMPLETE 4+ VIEW COMPARISON:  None Available. FINDINGS: Mild osteoarthrosis of the medial compartment of the knee as well as femoropatellar chondromalacia IMPRESSION: Mild osteoarthrosis of the medial compartment of the knee as well as femoropatellar chondromalacia. Electronically Signed   By: Shaaron Adler M.D.   On: 12/28/2023 10:14   DG Knee Complete 4 Views Left Result Date: 12/28/2023 CLINICAL DATA:  Left knee pain EXAM: LEFT KNEE - COMPLETE 4+ VIEW COMPARISON:  None Available. FINDINGS: Mild osteoarthrosis with narrowing of the medial compartment of the knee as well as femoropatellar osteoarthrosis with patellar chondromalacia IMPRESSION: Mild osteoarthrosis. Electronically Signed   By: Shaaron Adler M.D.   On:  12/28/2023 10:14     ELIGIBLE FOR AVAILABLE RESEARCH PROTOCOL: Opted against COMET  trial  ASSESSMENT: 73 y.o. Kendra Thompson woman status post left breast biopsy 04/18/2021 for ductal carcinoma in situ, grade 2, estrogen and progesterone receptor positive  (1) genetics testing July 13 and 20, 2022 through the [place] [facility] Panel and CancerNext-Expanded +RNAinsight Panels found no deleterious mutations in ATM, BRCA1, BRCA2, CDH1, CHEK2, PALB2, PTEN, and TP53. AIP, ALK, APC, ATM, AXIN2, BAP1, BARD1, BLM, BMPR1A, BRCA1, BRCA2, BRIP1, CDC73, CDH1, CDK4, CDKN1B, CDKN2A, CHEK2, CTNNA1, DICER1, FANCC, FH, FLCN, GALNT12, KIF1B, LZTR1, MAX, MEN1, MET, MLH1, MSH2, MSH3, MSH6, MUTYH, NBN, NF1, NF2, NTHL1, PALB2, PHOX2B, PMS2, POT1, PRKAR1A, PTCH1, PTEN, RAD51C, RAD51D, RB1, RECQL, RET, SDHA, SDHAF2, SDHB, SDHC, SDHD, SMAD4, SMARCA4, SMARCB1, SMARCE1, STK11, SUFU, TMEM127, TP53, TSC1, TSC2, VHL and XRCC2 (sequencing and deletion/duplication); EGFR, EGLN1, HOXB13, KIT, MITF, PDGFRA, POLD1, and POLE (sequencing only); EPCAM and GREM1 (deletion/duplication only).   (2) status post left lumpectomy 05/20/2021 for a 3.3 cm ductal carcinoma in situ, described as intermediate to high-grade, with negative margins  (3) adjuvant radiation to be completed 08/05/2021  (4) antiestrogens  (A) [facility] shows a T score of -0.7 (normal)   PLAN:  Breast cancer surveillance Recent mammogram findings prompted a diagnostic mammogram. She prefers mastectomy over lumpectomy if further surgery is needed due to anxiety from annual mammograms. Mastectomy may be recommended if surgery is required, especially since the breast has been radiated. - Order diagnostic mammogram. - Continue anastrozole in the interim - Ensure she stays active as tolerated. - Advise vitamin D supplementation or adequate sunlight exposure.  Osteopenia Bone density shows mild osteopenia with a T-score of -1.2. No  significant concern. - Advise vitamin D supplementation or adequate sunlight exposure.  Nerve pain Nerve pain from previous hand fracture managed with gabapentin 300 mg at night. Occasionally takes an additional 100 mg during the day but finds it unnecessary most of the time. - Continue gabapentin 300 mg at night. - Advise additional 300 mg during the day if needed.  Total time : 30 minutes.  *Total Encounter Time as defined by the Centers for Medicare and Medicaid Services includes, in addition to the face-to-face time of a patient visit (documented in the note above) non-face-to-face time: obtaining and reviewing outside history, ordering and reviewing medications, tests or procedures, care coordination (communications with other health care professionals or caregivers) and documentation in the medical record.

## 2024-01-18 DIAGNOSIS — M25562 Pain in left knee: Secondary | ICD-10-CM | POA: Diagnosis not present

## 2024-01-18 DIAGNOSIS — S76912A Strain of unspecified muscles, fascia and tendons at thigh level, left thigh, initial encounter: Secondary | ICD-10-CM | POA: Diagnosis not present

## 2024-01-20 DIAGNOSIS — M25562 Pain in left knee: Secondary | ICD-10-CM | POA: Diagnosis not present

## 2024-01-20 DIAGNOSIS — S76912A Strain of unspecified muscles, fascia and tendons at thigh level, left thigh, initial encounter: Secondary | ICD-10-CM | POA: Diagnosis not present

## 2024-01-25 DIAGNOSIS — S76912A Strain of unspecified muscles, fascia and tendons at thigh level, left thigh, initial encounter: Secondary | ICD-10-CM | POA: Diagnosis not present

## 2024-01-25 DIAGNOSIS — M25562 Pain in left knee: Secondary | ICD-10-CM | POA: Diagnosis not present

## 2024-01-27 ENCOUNTER — Telehealth: Admitting: Hematology and Oncology

## 2024-01-28 ENCOUNTER — Ambulatory Visit
Admission: RE | Admit: 2024-01-28 | Discharge: 2024-01-28 | Disposition: A | Discharge status: Home / Self Care | Source: Ambulatory Visit | Attending: Hematology and Oncology | Admitting: Hematology and Oncology

## 2024-01-28 DIAGNOSIS — D0512 Intraductal carcinoma in situ of left breast: Secondary | ICD-10-CM

## 2024-01-28 DIAGNOSIS — R921 Mammographic calcification found on diagnostic imaging of breast: Secondary | ICD-10-CM | POA: Diagnosis not present

## 2024-01-31 ENCOUNTER — Other Ambulatory Visit: Payer: Self-pay | Admitting: Hematology and Oncology

## 2024-01-31 DIAGNOSIS — R921 Mammographic calcification found on diagnostic imaging of breast: Secondary | ICD-10-CM

## 2024-02-01 ENCOUNTER — Encounter

## 2024-02-07 ENCOUNTER — Inpatient Hospital Stay: Attending: Hematology and Oncology | Admitting: Hematology and Oncology

## 2024-02-07 DIAGNOSIS — Z79811 Long term (current) use of aromatase inhibitors: Secondary | ICD-10-CM | POA: Diagnosis not present

## 2024-02-07 DIAGNOSIS — D0512 Intraductal carcinoma in situ of left breast: Secondary | ICD-10-CM | POA: Diagnosis not present

## 2024-02-07 NOTE — Progress Notes (Signed)
 [facility]  [phone] [phone]     ID: GALA PADOVANO DOB: [birth year]  MR#: [id number]  ZHY#:[id number]  Patient Care Team: Charise Companion, MD as PCP - General Lockie Rima, MD as Consulting Physician (General Surgery) Johna Myers, MD as Consulting Physician (Radiation Oncology) Magrinat, Rozella Cornfield, MD (Inactive) as Consulting Physician (Oncology) Ronn Cohn, MD as Consulting Physician (Orthopedic Surgery) Neda Balk, RN as Registered Nurse Causey, Laura Polio, NP as Nurse Practitioner (Hematology and Oncology) Murleen Arms, MD  CHIEF COMPLAINT: Noninvasive estrogen receptor positive breast cancer  CURRENT TREATMENT:   INTERVAL HISTORY:  Kendra Thompson is a 73 year old female with breast cancer who presents for telephone visit, Since she last saw us , she had a diagnostic mammogram, probably benign calcs. She is wondering if waiting 6 months before intervention is ideal. She is taking anastrozole daily.  COVID 19 VACCINATION STATUS: Moderna x3   HISTORY OF CURRENT ILLNESS: From the original intake note:  Kendra Thompson had routine screening mammography on 04/03/2021 showing a possible abnormality in the left breast. She underwent left diagnostic mammography with tomography [facility] on 04/11/2021 showing: breast density category B; indeterminate calcifications in lower-outer left breast, with largest area measuring 4.9 cm.  Accordingly on 04/18/2021 she proceeded to biopsy of the left breast area in question. The pathology from this procedure ([id number]) showed: ductal carcinoma in situ, intermediate grade. Prognostic indicators significant for: estrogen receptor, 95% positive and progesterone receptor, 80% positive, both with strong staining intensity.    Cancer Staging  Ductal carcinoma in situ (DCIS) of left breast Staging form: Breast, AJCC 8th Edition - Clinical stage from 04/30/2021: Stage 0 (cTis  (DCIS), cN0, cM0, ER+, PR+) - Signed by Willy Harvest, MD on 04/30/2021 Stage prefix: Initial diagnosis Nuclear grade: G2   The patient's subsequent history is as detailed below.   PAST MEDICAL HISTORY: Past Medical History:  Diagnosis Date   Allergy 1971   Asthma 1971   has not been present for several years   Breast cancer (HCC) 04/18/2021   Cataract 2020   Family history of breast cancer 04/30/2021   Headache    Migraines occasionally   Hypertension    Seasonal allergies    Wears glasses     PAST SURGICAL HISTORY: Past Surgical History:  Procedure Laterality Date   BREAST LUMPECTOMY Left 05/20/2021   BREAST LUMPECTOMY WITH RADIOACTIVE SEED LOCALIZATION Left 05/20/2021   Procedure: LEFT BREAST BRACKETED LUMPECTOMY WITH RADIOACTIVE SEED LOCALIZATION x3;  Surgeon: Lockie Rima, MD;  Location: [facility];  Service: General;  Laterality: Left;   CLOSED REDUCTION FINGER WITH PERCUTANEOUS PINNING Left 07/25/2013   Procedure: CLOSED REDUCTION FINGER WITH PERCUTANEOUS PINNING LEFT SMALL METACARPAL;  Surgeon: Milagros Alf, MD;  Location: [facility];  Service: Orthopedics;  Laterality: Left;   COLONOSCOPY  2013,2014   DIAGNOSTIC LAPAROSCOPY  1992   explor-   DILATION AND CURETTAGE OF UTERUS     FRACTURE SURGERY  2014   HAND SURGERY  02/2020    FAMILY HISTORY: Family History  Problem Relation Age of Onset   Breast cancer Mother 52   Arthritis Mother    Cancer Mother    Depression Mother    Heart disease Mother    Hypertension Mother    Heart attack Father    CAD Father        Diagnosed in early 47s   Arthritis Father    Hearing loss Father  Heart disease Father    Esophageal cancer Brother 39   Other Brother 108       neuroendocrine tumor   Alcohol abuse Brother    Breast cancer Maternal Grandmother        dx after 50   Diabetes Maternal Grandmother    Heart disease Maternal Grandmother    Hypertension Maternal Grandmother    Colon cancer Neg Hx     Stomach cancer Neg Hx    Rectal cancer Neg Hx   Her father died at age 40 from heart attack and COPD. Her mother died at age 68 from complications of breast cancer. She was initially diagnosed at age 46, had bilateral cancer, and recurred later in life. Theora has one brother and one sister. In addition to her mother, she reports breast cancer in her maternal grandmother (age unknown) and her sister ("pre-cancer") at age 28. She also reports cancer in her brother-- stomach at age 31 and neuroendocrine at age 59.   GYNECOLOGIC HISTORY:  No LMP recorded. Patient is postmenopausal. Menarche: 73 years old GX P 0 LMP ~age 15 Contraceptive: used only for one year in mid-20's. HRT never used  Hysterectomy? no BSO? 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 Husband Ray "Jodelle Red" is a retired IT sales professional; he was a Education officer, community. She is not a Actor.    ADVANCED DIRECTIVES: in place   HEALTH MAINTENANCE: Social History   Tobacco Use   Smoking status: Never    Passive exposure: Never   Smokeless tobacco: Never  Vaping Use   Vaping status: Never Used  Substance Use Topics   Alcohol use: Yes    Comment: very rarely   Drug use: Never     Colonoscopy: 09/2017 (Dr. Russella Dar), recall 2023  PAP: 07/2015, negative  Bone density: 02/2020, -0.7   Allergies  Allergen Reactions   Latex     rash   Tape Rash    Current Outpatient Medications  Medication Sig Dispense Refill   amLODipine (NORVASC) 10 MG tablet TAKE 1 TABLET BY MOUTH EVERY DAY 90 tablet 2   anastrozole (ARIMIDEX) 1 MG tablet TAKE 1 TABLET (1 MG TOTAL) BY MOUTH DAILY. START 08/26/2021 90 tablet 4   Candesartan Cilexetil-HCTZ 32-25 MG TABS TAKE 1 TABLET BY MOUTH EVERY DAY 90 tablet 3   cyclobenzaprine (FLEXERIL) 5 MG tablet Take 1 tablet (5 mg total) by mouth 3 (three) times daily as needed for muscle spasms. 30 tablet 0   diclofenac Sodium  (VOLTAREN) 1 % GEL Apply 2 g topically 4 (four) times daily.     fexofenadine (ALLEGRA) 180 MG tablet Take 180 mg by mouth daily as needed for allergies or rhinitis.     gabapentin (NEURONTIN) 300 MG capsule TAKE 1 CAPSULE BY MOUTH EVERYDAY AT BEDTIME 90 capsule 4   ibuprofen (ADVIL) 200 MG tablet Take 200-600 mg by mouth every 6 (six) hours as needed for headache or moderate pain.     meloxicam (MOBIC) 15 MG tablet Take 1 tablet daily with food for 7 days. Then take as needed. 30 tablet 0   MULTIPLE VITAMIN PO Take 1 tablet by mouth daily.     pantoprazole (PROTONIX) 40 MG tablet TAKE 1 TABLET BY MOUTH EVERY DAY 90 tablet 2   No current facility-administered medications for this visit.    OBJECTIVE: White woman in no acute distress  There were no vitals filed for this visit.  There is no height or weight on file to calculate BMI.   Wt Readings from Last 3 Encounters:  01/17/24 258 lb 6.4 oz (117.2 kg)  12/28/23 250 lb (113.4 kg)  10/08/23 250 lb (113.4 kg)      ECOG FS:1 - Symptomatic but completely ambulatory  Telephone visit  LAB RESULTS:  CMP     Component Value Date/Time   NA 139 04/21/2023 1206   K 4.4 04/21/2023 1206   CL 97 04/21/2023 1206   CO2 24 04/21/2023 1206   GLUCOSE 119 (H) 04/21/2023 1206   GLUCOSE 124 (H) 04/30/2021 0754   BUN 17 04/21/2023 1206   CREATININE 0.88 04/21/2023 1206   CREATININE 0.93 04/30/2021 0754   CREATININE 0.76 09/09/2016 0928   CALCIUM 9.5 04/21/2023 1206   PROT 7.0 04/21/2023 1206   ALBUMIN 4.4 04/21/2023 1206   AST 18 04/21/2023 1206   AST 17 04/30/2021 0754   ALT 29 04/21/2023 1206   ALT 20 04/30/2021 0754   ALKPHOS 93 04/21/2023 1206   BILITOT 0.6 04/21/2023 1206   BILITOT 0.6 04/30/2021 0754   GFRNONAA >60 04/30/2021 0754   GFRNONAA 84 09/09/2016 0928   GFRAA 81 02/19/2020 0915   GFRAA >89 09/09/2016 0928    No results found for: "TOTALPROTELP", "ALBUMINELP", "A1GS", "A2GS", "BETS", "BETA2SER", "GAMS", "MSPIKE",  "SPEI"  Lab Results  Component Value Date   WBC 8.2 03/11/2023   NEUTROABS 5.6 04/30/2021   HGB 13.0 03/11/2023   HCT 38.7 03/11/2023   MCV 88 03/11/2023   PLT 304 03/11/2023    No results found for: "LABCA2"  No components found for: "ZOXWRU045"  No results for input(s): "INR" in the last 168 hours.  No results found for: "LABCA2"  No results found for: "WUJ811"  No results found for: "CAN125"  No results found for: "CAN153"  No results found for: "CA2729"  No components found for: "HGQUANT"  No results found for: "CEA1", "CEA" / No results found for: "CEA1", "CEA"   No results found for: "AFPTUMOR"  No results found for: "CHROMOGRNA"  No results found for: "KPAFRELGTCHN", "LAMBDASER", "KAPLAMBRATIO" (kappa/lambda light chains)  No results found for: "HGBA", "HGBA2QUANT", "HGBFQUANT", "HGBSQUAN" (Hemoglobinopathy evaluation)   No results found for: "LDH"  No results found for: "IRON", "TIBC", "IRONPCTSAT" (Iron and TIBC)  No results found for: "FERRITIN"  Urinalysis    Component Value Date/Time   COLORURINE yellow 07/30/2010 0816   APPEARANCEUR Clear 07/30/2010 0816   LABSPEC 1.025 02/03/2014 0947   PHURINE 6.0 02/03/2014 0947   GLUCOSEU NEGATIVE 02/03/2014 0947   HGBUR TRACE (A) 02/03/2014 0947   HGBUR trace-intact 07/30/2010 0816   BILIRUBINUR negative 11/01/2022 1100   KETONESUR negative 11/01/2022 1100   KETONESUR NEGATIVE 02/03/2014 0947   PROTEINUR trace (A) 11/01/2022 1100   PROTEINUR NEGATIVE 02/03/2014 0947   UROBILINOGEN 0.2 11/01/2022 1100   UROBILINOGEN 0.2 02/03/2014 0947   NITRITE Negative 11/01/2022 1100   NITRITE NEGATIVE 02/03/2014 0947   LEUKOCYTESUR Large (3+) (A) 11/01/2022 1100    STUDIES: MM Digital Diagnostic Unilat L Result Date: 01/28/2024 CLINICAL DATA:  73 year old woman with prior history left breast DCIS and atypical lobular hyperplasia underwent lumpectomy on 05/20/2021. Most recent mammogram on 01/13/2024  demonstrated left breast calcifications, and she was recalled for further workup. EXAM: DIGITAL DIAGNOSTIC UNILATERAL LEFT MAMMOGRAM WITH CAD TECHNIQUE: Left digital diagnostic mammography was performed. COMPARISON:  Previous exam(s). ACR Breast Density Category b: There are scattered areas of fibroglandular density. FINDINGS: Full field mL and spot magnification  views of the left lower outer breast calcifications were performed. 3 mm group of coarse heterogeneous, likely early dystrophic calcifications are seen in the lower outer breast within the lumpectomy site. 10 mm group of amorphous calcifications are seen in the outer breast, also within the lumpectomy site. IMPRESSION: Probably benign groups of calcifications seen within the left breast lumpectomy bed. RECOMMENDATION: Diagnostic left mammogram in 6 months. I have discussed the findings and recommendations with the patient. If applicable, a reminder letter will be sent to the patient regarding the next appointment. BI-RADS CATEGORY  3: Probably benign. Electronically Signed   By: Elester Grim M.D.   On: 01/28/2024 13:00   MM 3D SCREENING MAMMOGRAM BILATERAL BREAST Result Date: 01/14/2024 CLINICAL DATA:  Screening. History of a LEFT lumpectomy for DCIS in 2022 EXAM: DIGITAL SCREENING BILATERAL MAMMOGRAM WITH TOMOSYNTHESIS AND CAD TECHNIQUE: Bilateral screening digital craniocaudal and mediolateral oblique mammograms were obtained. Bilateral screening digital breast tomosynthesis was performed. The images were evaluated with computer-aided detection. COMPARISON:  Previous exam(s). ACR Breast Density Category b: There are scattered areas of fibroglandular density. FINDINGS: In the left breast, calcifications warrant further evaluation. In the right breast, no findings suspicious for malignancy. IMPRESSION: Further evaluation is suggested for calcifications in the left breast. RECOMMENDATION: Diagnostic mammogram of the left breast. (Code:[id number]) The  patient will be contacted regarding the findings, and additional imaging will be scheduled. BI-RADS CATEGORY  0: Incomplete: Need additional imaging evaluation. Electronically Signed   By: Clancy Crimes M.D.   On: 01/14/2024 15:22     ELIGIBLE FOR AVAILABLE RESEARCH PROTOCOL: Opted against COMET trial  ASSESSMENT: 73 y.o. [place] woman status post left breast biopsy 04/18/2021 for ductal carcinoma in situ, grade 2, estrogen and progesterone receptor positive  (1) genetics testing July 13 and 20, 2022 through the [facility] Panel and CancerNext-Expanded +RNAinsight Panels found no deleterious mutations in ATM, BRCA1, BRCA2, CDH1, CHEK2, PALB2, PTEN, and TP53. AIP, ALK, APC, ATM, AXIN2, BAP1, BARD1, BLM, BMPR1A, BRCA1, BRCA2, BRIP1, CDC73, CDH1, CDK4, CDKN1B, CDKN2A, CHEK2, CTNNA1, DICER1, FANCC, FH, FLCN, GALNT12, KIF1B, LZTR1, MAX, MEN1, MET, MLH1, MSH2, MSH3, MSH6, MUTYH, NBN, NF1, NF2, NTHL1, PALB2, PHOX2B, PMS2, POT1, PRKAR1A, PTCH1, PTEN, RAD51C, RAD51D, RB1, RECQL, RET, SDHA, SDHAF2, SDHB, SDHC, SDHD, SMAD4, SMARCA4, SMARCB1, SMARCE1, STK11, SUFU, TMEM127, TP53, TSC1, TSC2, VHL and XRCC2 (sequencing and deletion/duplication); EGFR, EGLN1, HOXB13, KIT, MITF, PDGFRA, POLD1, and POLE (sequencing only); EPCAM and GREM1 (deletion/duplication only).   (2) status post left lumpectomy 05/20/2021 for a 3.3 cm ductal carcinoma in situ, described as intermediate to high-grade, with negative margins  (3) adjuvant radiation to be completed 08/05/2021  (4) antiestrogens  (A) [facility] shows a T score of -0.7 (normal)   PLAN:  We reviewed most recent mammogram results, probably benign calcs in left breast. Repeat mammogram scheduled in oct 2025. She was worried about waiting for 6 months. Reassured that they are probably benign Encouraged to continue anastrozole We will follow up on the Oct results and keep her posted.  I connected with  Tora Freeman on 02/07/24 by a telephone application and verified that I am speaking with the correct person using two identifiers.   I discussed the limitations of evaluation and management by telemedicine. The patient expressed understanding and agreed to proceed.  Time spent: 10 min  Location of provider: office Location of patient: Home  *Total Encounter Time as defined by the Centers for Medicare and Medicaid Services includes, in addition  to the face-to-face time of a patient visit (documented in the note above) non-face-to-face time: obtaining and reviewing outside history, ordering and reviewing medications, tests or procedures, care coordination (communications with other health care professionals or caregivers) and documentation in the medical record.

## 2024-02-08 ENCOUNTER — Ambulatory Visit: Admitting: Family Medicine

## 2024-02-08 VITALS — BP 152/60 | Ht 67.0 in | Wt 250.0 lb

## 2024-02-08 DIAGNOSIS — S76312D Strain of muscle, fascia and tendon of the posterior muscle group at thigh level, left thigh, subsequent encounter: Secondary | ICD-10-CM

## 2024-02-08 DIAGNOSIS — S86112D Strain of other muscle(s) and tendon(s) of posterior muscle group at lower leg level, left leg, subsequent encounter: Secondary | ICD-10-CM | POA: Diagnosis not present

## 2024-02-08 DIAGNOSIS — M1710 Unilateral primary osteoarthritis, unspecified knee: Secondary | ICD-10-CM

## 2024-02-08 DIAGNOSIS — I10 Essential (primary) hypertension: Secondary | ICD-10-CM

## 2024-02-09 ENCOUNTER — Encounter: Payer: Self-pay | Admitting: Family Medicine

## 2024-02-09 NOTE — Progress Notes (Signed)
 DATE OF VISIT: 02/08/2024        Kendra Thompson DOB: 24-Oct-1951 MRN: [id number]  CC:  f/u knee pain, left calf pain, left hamstring pain  History of present Illness: Kendra Thompson is a 73 y.o. female who presents for a follow-up visit  Last seen by me 12/28/23 - underwent b/l knee CSI at that time  Today she reports her knees are feeling approximately 95% improved Still having some discomfort in the left distal hamstring in the left calf Has been wearing body helix sleeve, which she thinks is helpful Has done PT with [facility] - Was transition to home exercise program which she has been doing regularly She is able to walk more Overall she is pleased with her progress  Medications:  Outpatient Encounter Medications as of 02/08/2024  Medication Sig   amLODipine (NORVASC) 10 MG tablet TAKE 1 TABLET BY MOUTH EVERY DAY   anastrozole (ARIMIDEX) 1 MG tablet TAKE 1 TABLET (1 MG TOTAL) BY MOUTH DAILY. START 08/26/2021   Candesartan Cilexetil-HCTZ 32-25 MG TABS TAKE 1 TABLET BY MOUTH EVERY DAY   cyclobenzaprine (FLEXERIL) 5 MG tablet Take 1 tablet (5 mg total) by mouth 3 (three) times daily as needed for muscle spasms.   diclofenac Sodium (VOLTAREN) 1 % GEL Apply 2 g topically 4 (four) times daily.   fexofenadine (ALLEGRA) 180 MG tablet Take 180 mg by mouth daily as needed for allergies or rhinitis.   gabapentin (NEURONTIN) 300 MG capsule TAKE 1 CAPSULE BY MOUTH EVERYDAY AT BEDTIME   ibuprofen (ADVIL) 200 MG tablet Take 200-600 mg by mouth every 6 (six) hours as needed for headache or moderate pain.   meloxicam (MOBIC) 15 MG tablet Take 1 tablet daily with food for 7 days. Then take as needed.   MULTIPLE VITAMIN PO Take 1 tablet by mouth daily.   pantoprazole (PROTONIX) 40 MG tablet TAKE 1 TABLET BY MOUTH EVERY DAY   No facility-administered encounter medications on file as of 02/08/2024.    Allergies: is allergic to latex and tape.  Physical Examination: Vitals: BP (!) 152/60   Ht  5\' 7"  (1.702 m)   Wt 250 lb (113.4 kg)   BMI 39.16 kg/m  GENERAL:  Kendra Thompson is a 74 y.o. female appearing their stated age, alert and oriented x 3, in no apparent distress.  SKIN: no rashes or lesions, skin clean, dry, intact MSK: Knees: Bilateral knee without swelling or effusion.  Full range of motion with 1+ patellofemoral crepitus.  No medial or lateral joint line tenderness.  Varus laxity.  [place].  Does have some slight tightness along the left distal biceps femoris and along the proximal lateral gastrocnemius and the posterior aspect of the knee.  No palpable knots or cords.  No palpable defects. Walking without a limp neurovascular intact distally  Assessment & Plan Arthritis of knee, bilateral, chronic Bilateral knee arthritis status post cortisone injection 12/28/23, has had approximate 95% improvement and is doing well.  Has completed PT and transition to HEP  Plan: - Continue knee sleeve as she is doing - Can continue Voltaren gel as needed - Continue her home exercise program - Could consider repeat cortisone injection after 03/29/24.  Could also consider HA injections in the future Gastrocnemius strain, left, subsequent encounter Overall improving with PT, still with some mild discomfort  Plan: - Should continue with home exercise program - Continue Voltaren gel as needed - Slowly advance activity as tolerated - Heat or ice as needed -  Follow-up 4 to 6 weeks if worsening or not improving, sooner as needed Hamstring strain, left, subsequent encounter Overall improving with PT, still with some mild discomfort  Plan: - Should continue with home exercise program - Continue Voltaren gel as needed - Slowly advance activity as tolerated - Heat or ice as needed - Follow-up 4 to 6 weeks if worsening or not improving, sooner as needed Primary hypertension Elevated blood pressure today, no red flags  Plan: - Should continue her blood pressure medications as  prescribed by PCP including amlodipine, candesartan/HCTZ - Follow-up with PCP as scheduled   Patient expressed understanding & agreement with above.  Encounter Diagnoses  Name Primary?   Arthritis of knee, bilateral, chronic Yes   Gastrocnemius strain, left, subsequent encounter    Hamstring strain, left, subsequent encounter    Primary hypertension     No orders of the defined types were placed in this encounter.

## 2024-02-09 NOTE — Assessment & Plan Note (Signed)
 Overall improving with PT, still with some mild discomfort  Plan: - Should continue with home exercise program - Continue Voltaren gel as needed - Slowly advance activity as tolerated - Heat or ice as needed - Follow-up 4 to 6 weeks if worsening or not improving, sooner as needed

## 2024-02-09 NOTE — Assessment & Plan Note (Signed)
 Bilateral knee arthritis status post cortisone injection 12/28/23, has had approximate 95% improvement and is doing well.  Has completed PT and transition to HEP  Plan: - Continue knee sleeve as she is doing - Can continue Voltaren gel as needed - Continue her home exercise program - Could consider repeat cortisone injection after 03/29/24.  Could also consider HA injections in the future

## 2024-02-10 ENCOUNTER — Ambulatory Visit (INDEPENDENT_AMBULATORY_CARE_PROVIDER_SITE_OTHER): Payer: Medicare HMO

## 2024-02-10 VITALS — Ht 67.0 in | Wt 250.0 lb

## 2024-02-10 DIAGNOSIS — Z0000 Encounter for general adult medical examination without abnormal findings: Secondary | ICD-10-CM

## 2024-02-10 NOTE — Patient Instructions (Addendum)
 Ms. Kendra Thompson , Thank you for taking time to come for your Medicare Wellness Visit. I appreciate your ongoing commitment to your health goals. Please review the following plan we discussed and let me know if I can assist you in the future.   Referrals/Orders/Follow-Ups/Clinician Recommendations: Keep maintaining your health by keeping your appointments with Dr.  Violetta Grice and any specialists that you may see.  Call us  if you need anything.  Have a great year!!!!  This is a list of the screening recommended for you and due dates:  Health Maintenance  Topic Date Due   COVID-19 Vaccine (4 - 2024-25 season) 06/27/2023   Flu Shot  05/26/2024   Medicare Annual Wellness Visit  02/09/2025   Mammogram  01/27/2026   Colon Cancer Screening  02/15/2030   DTaP/Tdap/Td vaccine (4 - Td or Tdap) 04/20/2033   Pneumonia Vaccine  Completed   DEXA scan (bone density measurement)  Completed   Hepatitis C Screening  Completed   Zoster (Shingles) Vaccine  Completed   HPV Vaccine  Aged Out   Meningitis B Vaccine  Aged Out    Advanced directives: (Copy Requested) Please bring a copy of your health care power of attorney and living will to the office to be added to your chart at your convenience. You can mail to [facility] [address] [place], [place] [address] or email to [email or web address]  Next Medicare Annual Wellness Visit scheduled for next year: Yes, It was nice speaking with you today! Your next Annual Wellness Visit is scheduled for 02/12/2025 at 11:50 a.m. via PHONE VISIT. If you need to reschedule or cancel, please call [phone].

## 2024-02-10 NOTE — Progress Notes (Signed)
 Because this visit was a virtual/telehealth visit,  certain criteria was not obtained, such a blood pressure, CBG if applicable, and timed get up and go. Any medications not marked as "taking" were not mentioned during the medication reconciliation part of the visit. Any vitals not documented were not able to be obtained due to this being a telehealth visit or patient was unable to self-report a recent blood pressure reading due to a lack of equipment at home via telehealth. Vitals that have been documented are verbally provided by the patient.   Subjective:   Kendra Thompson is a 73 y.o. who presents for a Medicare Wellness preventive visit.  Visit Complete: Virtual I connected with  Kendra Thompson on 02/10/24 by a audio enabled telemedicine application and verified that I am speaking with the correct person using two identifiers.  Patient Location: Home  Provider Location: Office/Clinic  I discussed the limitations of evaluation and management by telemedicine. The patient expressed understanding and agreed to proceed.  Vital Signs: Because this visit was a virtual/telehealth visit, some criteria may be missing or patient reported. Any vitals not documented were not able to be obtained and vitals that have been documented are patient reported.  VideoDeclined- This patient declined Librarian, academic. Therefore the visit was completed with audio only.  Persons Participating in Visit: Patient.  AWV Questionnaire: Yes: Patient Medicare AWV questionnaire was completed by the patient on 02/06/2024; I have confirmed that all information answered by patient is correct and no changes since this date.  Cardiac Risk Factors include: advanced age (>38men, >58 women);hypertension;obesity (BMI >30kg/m2)     Objective:    Today's Vitals   02/10/24 1153  Weight: 250 lb (113.4 kg)  Height: 5\' 7"  (1.702 m)  PainSc: 0-No pain   Body mass index is 39.16  kg/m.     02/10/2024   11:55 AM 01/08/2023    1:26 PM 04/08/2022    9:10 AM 06/23/2021    2:11 PM 05/07/2021    8:05 AM 03/26/2021    8:38 AM 09/11/2020   11:19 AM  Advanced Directives  Does Patient Have a Medical Advance Directive? Yes No;Yes No Yes Yes Yes No  Type of Estate agent of [place];Living will Healthcare Power of [place];Living will  Healthcare Power of [place];Living will Healthcare Power of [place];Living will Healthcare Power of [place];Living will   Does patient want to make changes to medical advance directive?  No - Patient declined  No - Patient declined     Copy of Healthcare Power of Attorney in Chart? No - copy requested No - copy requested  No - copy requested No - copy requested No - copy requested   Would patient like information on creating a medical advance directive?  No - Patient declined     No - Patient declined    Current Medications (verified) Outpatient Encounter Medications as of 02/10/2024  Medication Sig   amLODipine (NORVASC) 10 MG tablet TAKE 1 TABLET BY MOUTH EVERY DAY   anastrozole (ARIMIDEX) 1 MG tablet TAKE 1 TABLET (1 MG TOTAL) BY MOUTH DAILY. START 08/26/2021   Candesartan Cilexetil-HCTZ 32-25 MG TABS TAKE 1 TABLET BY MOUTH EVERY DAY   cyclobenzaprine (FLEXERIL) 5 MG tablet Take 1 tablet (5 mg total) by mouth 3 (three) times daily as needed for muscle spasms.   diclofenac Sodium (VOLTAREN) 1 % GEL Apply 2 g topically 4 (four) times daily.   fexofenadine (ALLEGRA) 180 MG tablet Take 180 mg  by mouth daily as needed for allergies or rhinitis.   gabapentin (NEURONTIN) 300 MG capsule TAKE 1 CAPSULE BY MOUTH EVERYDAY AT BEDTIME   ibuprofen (ADVIL) 200 MG tablet Take 200-600 mg by mouth every 6 (six) hours as needed for headache or moderate pain.   meloxicam (MOBIC) 15 MG tablet Take 1 tablet daily with food for 7 days. Then take as needed.   MULTIPLE VITAMIN PO Take 1 tablet by mouth daily.   pantoprazole (PROTONIX) 40 MG  tablet TAKE 1 TABLET BY MOUTH EVERY DAY   No facility-administered encounter medications on file as of 02/10/2024.    Allergies (verified) Latex and Tape   History: Past Medical History:  Diagnosis Date   Allergy 1971   Asthma 1971   has not been present for several years   Breast cancer (HCC) 04/18/2021   Cataract 2020   Family history of breast cancer 04/30/2021   Headache    Migraines occasionally   Hypertension    Seasonal allergies    Wears glasses    Past Surgical History:  Procedure Laterality Date   BREAST LUMPECTOMY Left 05/20/2021   BREAST LUMPECTOMY WITH RADIOACTIVE SEED LOCALIZATION Left 05/20/2021   Procedure: LEFT BREAST BRACKETED LUMPECTOMY WITH RADIOACTIVE SEED LOCALIZATION x3;  Surgeon: Almond Lint, MD;  Location: [facility] OR;  Service: General;  Laterality: Left;   CLOSED REDUCTION FINGER WITH PERCUTANEOUS PINNING Left 07/25/2013   Procedure: CLOSED REDUCTION FINGER WITH PERCUTANEOUS PINNING LEFT SMALL METACARPAL;  Surgeon: Tami Ribas, MD;  Location: [facility];  Service: Orthopedics;  Laterality: Left;   COLONOSCOPY  2013,2014   DIAGNOSTIC LAPAROSCOPY  1992   explor-   DILATION AND CURETTAGE OF UTERUS     FRACTURE SURGERY  2014   HAND SURGERY  02/2020   Family History  Problem Relation Age of Onset   Breast cancer Mother 85   Arthritis Mother    Cancer Mother    Depression Mother    Heart disease Mother    Hypertension Mother    Heart attack Father    CAD Father        Diagnosed in early 12s   Arthritis Father    Hearing loss Father    Heart disease Father    Esophageal cancer Brother 58   Other Brother 47       neuroendocrine tumor   Alcohol abuse Brother    Breast cancer Maternal Grandmother        dx after 50   Diabetes Maternal Grandmother    Heart disease Maternal Grandmother    Hypertension Maternal Grandmother    Colon cancer Neg Hx    Stomach cancer Neg Hx    Rectal cancer Neg Hx    Social History    Socioeconomic History   Marital status: Married    Spouse name: Personal assistant   Number of children: 0   Years of education: Not on file   Highest education level: Associate degree: occupational, Scientist, product/process development, or vocational program  Occupational History   Occupation: Retired  Tobacco Use   Smoking status: Never    Passive exposure: Never   Smokeless tobacco: Never  Vaping Use   Vaping status: Never Used  Substance and Sexual Activity   Alcohol use: Yes    Comment: very rarely   Drug use: Never   Sexual activity: Not Currently    Birth control/protection: Post-menopausal  Other Topics Concern   Not on file  Social History Narrative   Not on file  Social Drivers of Corporate investment banker Strain: Low Risk  (02/10/2024)   Overall Financial Resource Strain (CARDIA)    Difficulty of Paying Living Expenses: Not hard at all  Food Insecurity: No Food Insecurity (02/10/2024)   Hunger Vital Sign    Worried About Running Out of Food in the Last Year: Never true    Ran Out of Food in the Last Year: Never true  Transportation Needs: No Transportation Needs (02/10/2024)   PRAPARE - Administrator, Civil Service (Medical): No    Lack of Transportation (Non-Medical): No  Physical Activity: Insufficiently Active (02/10/2024)   Exercise Vital Sign    Days of Exercise per Week: 5 days    Minutes of Exercise per Session: 20 min  Stress: No Stress Concern Present (02/10/2024)   [employer] of Occupational Health - Occupational Stress Questionnaire    Feeling of Stress : Not at all  Social Connections: Socially Integrated (02/10/2024)   Social Connection and Isolation Panel [NHANES]    Frequency of Communication with Friends and Family: Once a week    Frequency of Social Gatherings with Friends and Family: Twice a week    Attends Religious Services: 1 to 4 times per year    Active Member of [employer] or Organizations: Yes    Attends Banker Meetings: 1 to 4 times  per year    Marital Status: Married    Tobacco Counseling Counseling given: Not Answered    Clinical Intake:  Pre-visit preparation completed: Yes  Pain : No/denies pain Pain Score: 0-No pain     BMI - recorded: 39.16 Nutritional Status: BMI > 30  Obese Nutritional Risks: None Diabetes: No  Lab Results  Component Value Date   HGBA1C 5.9 (A) 04/21/2023   HGBA1C 5.9 (A) 04/08/2022   HGBA1C 6.1 (A) 03/26/2021     How often do you need to have someone help you when you read instructions, pamphlets, or other written materials from your doctor or pharmacy?: 1 - Never  Interpreter Needed?: No  Information entered by :: Pecolia Marando N. Abdurahman Rugg, LPN.   Activities of Daily Living     02/10/2024   12:05 PM 02/06/2024    5:12 PM  In your present state of health, do you have any difficulty performing the following activities:  Hearing? 0 0  Vision? 0 0  Difficulty concentrating or making decisions? 0 0  Walking or climbing stairs? 1 1  Dressing or bathing? 0   Doing errands, shopping? 0 0  Preparing Food and eating ? N N  Using the Toilet? N N  In the past six months, have you accidently leaked urine? N N  Do you have problems with loss of bowel control? N N  Managing your Medications? N   Managing your Finances? N N  Housekeeping or managing your Housekeeping? N N    Patient Care Team: Charise Companion, MD as PCP - General Lockie Rima, MD as Consulting Physician (General Surgery) Johna Myers, MD as Consulting Physician (Radiation Oncology) Magrinat, Rozella Cornfield, MD (Inactive) as Consulting Physician (Oncology) Ronn Cohn, MD as Consulting Physician (Orthopedic Surgery) Neda Balk, RN as Registered Nurse Causey, Laura Polio, NP as Nurse Practitioner (Hematology and Oncology) Beverly Buckler, OD. as Consulting Physician (Optometry)  Indicate any recent Medical Services you may have received from other than [facility] providers in the past year (date may be  approximate).     Assessment:   This is a routine wellness examination for  Kendra Thompson.  Hearing/Vision screen Hearing Screening - Comments:: Patient stated that she has some hearing difficulties. No hearing aids.  Vision Screening - Comments:: Wears rx glasses - up to date with routine eye exams with Druscilla Brownie Hutto, OD.    Goals Addressed             This Visit's Progress    Patient Stated       02/10/2024: Maintain my health by staying independent and active.       Depression Screen     02/10/2024   11:55 AM 04/21/2023    8:31 AM 03/11/2023    9:03 AM 01/08/2023    1:13 PM 04/08/2022    8:26 AM 08/26/2021    9:20 AM 03/26/2021    8:52 AM  PHQ 2/9 Scores  PHQ - 2 Score 0 0 0 0 0 0 0  PHQ- 9 Score 0 0 2  3  6     Fall Risk     02/10/2024   12:07 PM 02/06/2024    5:12 PM 03/11/2023    9:04 AM 01/08/2023    1:13 PM 03/26/2021    8:38 AM  Fall Risk   Falls in the past year? 0 0 0 0 0  Number falls in past yr: 0  0 0 0  Injury with Fall? 0 0 0 0   Risk for fall due to : No Fall Risks   No Fall Risks   Follow up Falls prevention discussed;Falls evaluation completed   Falls evaluation completed     MEDICARE RISK AT HOME:  Medicare Risk at Home Any stairs in or around the home?: No If so, are there any without handrails?: No Home free of loose throw rugs in walkways, pet beds, electrical cords, etc?: Yes Adequate lighting in your home to reduce risk of falls?: Yes Life alert?: No Use of a cane, walker or w/c?: No Grab bars in the bathroom?: Yes Shower chair or bench in shower?: No Elevated toilet seat or a handicapped toilet?: No  TIMED UP AND GO:  Was the test performed?  No  Cognitive Function: 6CIT completed    02/10/2024   12:05 PM  MMSE - Mini Mental State Exam  Not completed: Unable to complete        02/10/2024   12:07 PM 01/08/2023    1:15 PM  6CIT Screen  What Year? 0 points 0 points  What month? 0 points 0 points  What time? 0 points 0 points  Count  back from 20 0 points 0 points  Months in reverse 0 points 0 points  Repeat phrase 0 points 0 points  Total Score 0 points 0 points    Immunizations Immunization History  Administered Date(s) Administered   Influenza Split 03/08/2006   Influenza Whole 07/30/2010, 08/10/2012   Influenza, High Dose Seasonal PF 07/13/2018, 06/14/2019   Influenza,inj,Quad PF,6+ Mos 08/26/2013   Influenza-Unspecified 07/11/2015, 07/23/2016, 07/15/2017, 07/10/2018, 06/27/2020, 07/18/2021, 10/01/2022   Moderna Sars-Covid-2 Vaccination 01/05/2020, 08/28/2020   Pfizer Covid-19 Vaccine Bivalent Booster 52yrs & up 07/18/2021   Pneumococcal Conjugate-13 10/27/2018   Pneumococcal Polysaccharide-23 08/12/2011, 02/19/2020   Respiratory Syncytial Virus Vaccine,Recomb Aduvanted(Arexvy) 10/01/2022   Td 10/27/2003   Tdap 10/27/1999, 04/21/2023   Tetanus Immune Globulin 10/27/1999   Zoster Recombinant(Shingrix) 06/27/2020, 11/04/2020   Zoster, Live 09/11/2016    Screening Tests Health Maintenance  Topic Date Due   COVID-19 Vaccine (4 - 2024-25 season) 06/27/2023   INFLUENZA VACCINE  05/26/2024   Medicare Annual Wellness (AWV)  02/09/2025   MAMMOGRAM  01/27/2026   Colonoscopy  02/15/2030   DTaP/Tdap/Td (4 - Td or Tdap) 04/20/2033   Pneumonia Vaccine 36+ Years old  Completed   DEXA SCAN  Completed   Hepatitis C Screening  Completed   Zoster Vaccines- Shingrix  Completed   HPV VACCINES  Aged Out   Meningococcal B Vaccine  Aged Out    Health Maintenance  Health Maintenance Due  Topic Date Due   COVID-19 Vaccine (4 - 2024-25 season) 06/27/2023   Health Maintenance Items Addressed: Yes Patient is current and up to date with screenings and vaccines.  Additional Screening:  Vision Screening: Recommended annual ophthalmology exams for early detection of glaucoma and other disorders of the eye.  Dental Screening: Recommended annual dental exams for proper oral hygiene  Community Resource Referral /  Chronic Care Management: CRR required this visit?  No   CCM required this visit?  No     Plan:     I have personally reviewed and noted the following in the patient's chart:   Medical and social history Use of alcohol, tobacco or illicit drugs  Current medications and supplements including opioid prescriptions. Patient is not currently taking opioid prescriptions. Functional ability and status Nutritional status Physical activity Advanced directives List of other physicians Hospitalizations, surgeries, and ER visits in previous 12 months Vitals Screenings to include cognitive, depression, and falls Referrals and appointments  In addition, I have reviewed and discussed with patient certain preventive protocols, quality metrics, and best practice recommendations. A written personalized care plan for preventive services as well as general preventive health recommendations were provided to patient.     Margette Sheldon, LPN   1/61/0960   After Visit Summary: (MyChart) Due to this being a telephonic visit, the after visit summary with patients personalized plan was offered to patient via MyChart   Notes: Nothing significant to report at this time.

## 2024-03-14 ENCOUNTER — Encounter: Payer: Self-pay | Admitting: Hematology and Oncology

## 2024-03-21 ENCOUNTER — Ambulatory Visit (INDEPENDENT_AMBULATORY_CARE_PROVIDER_SITE_OTHER): Admitting: Student

## 2024-03-21 VITALS — BP 139/57 | HR 85 | Ht 67.0 in | Wt 261.0 lb

## 2024-03-21 DIAGNOSIS — R053 Chronic cough: Secondary | ICD-10-CM

## 2024-03-21 DIAGNOSIS — K1370 Unspecified lesions of oral mucosa: Secondary | ICD-10-CM | POA: Diagnosis not present

## 2024-03-21 MED ORDER — FLUTICASONE PROPIONATE 50 MCG/ACT NA SUSP: 2.0000 | Freq: Every day | NASAL | 6 refills | Status: DC

## 2024-03-21 NOTE — Progress Notes (Unsigned)
    SUBJECTIVE:   CHIEF COMPLAINT / HPI:   The patient is a 73 year old female who presents with rough patches in the mouth and a chronic cough.  Rough patches inside the mouth have been present for a month and a half, initially on the roof of the mouth and inside the right cheek, extending to the lower lip. The patches are not painful, with no visible lesions, only a rough texture. Listerine was ineffective, but hydrogen peroxide mouth rinse led to significant improvement, with complete resolution after using an Oral B rinse containing hydrogen peroxide.  A chronic cough with sinus drainage at night has persisted for the last couple months. The cough is productive with mucus running down the throat, but they are unable to expectorate it. Sudafed and Mucinex provided no relief. There is no fever, chills, or body aches. Itchy eyes and nasal congestion have been present for a couple of months. Flonase  has not been used. No new dietary changes or consumption of hot beverages are reported.  PERTINENT  PMH / PSH: Asthma, GERD, hx breast cancer  OBJECTIVE:   BP (!) 143/63   Pulse 85   Ht 5\' 7"  (1.702 m)   Wt 261 lb (118.4 kg)   SpO2 98%   BMI 40.88 kg/m    General: NAD, pleasant, able to participate in exam Cardiac: RRR, no murmurs. Respiratory: CTAB, normal effort, No wheezes, rales or rhonchi Abdomen: Bowel sounds present, nontender, nondistended, no hepatosplenomegaly. Extremities: no edema or cyanosis. Skin: warm and dry, no rashes noted Neuro: alert, no obvious focal deficits Psych: Normal affect and mood  ASSESSMENT/PLAN:   No problem-specific Assessment & Plan notes found for this encounter.     Dr. Glenn Lange, DO  [facility]    {    This will disappear when note is signed, click to select method of visit    :1}

## 2024-03-21 NOTE — Patient Instructions (Signed)
 It was great to see you! Thank you for allowing me to participate in your care!   Our plans for today:  - If rough patches return, hold off on using mouth wash and return for further evaluation, can also see your dentist  - Flonase  sent to pharmacy for post nasal drip, congestion and cough. If this does not help in the next ~ 2 weeks, return.    Take care and seek immediate care sooner if you develop any concerns.   Dr. Glenn Lange, DO [facility]

## 2024-03-22 DIAGNOSIS — K1370 Unspecified lesions of oral mucosa: Secondary | ICD-10-CM | POA: Insufficient documentation

## 2024-03-22 DIAGNOSIS — R053 Chronic cough: Secondary | ICD-10-CM | POA: Insufficient documentation

## 2024-03-22 NOTE — Assessment & Plan Note (Signed)
 Rough patches in mouth have completely resolved per pt. There are 3 very small whitish lesions on oral mucosa of R cheeck near back lower molar as in photo. Do not appear to be striae. Unlikely lichen sclerosis or leukoplakia. Pt advised to return if lesions become larger or rough patches return.

## 2024-03-22 NOTE — Assessment & Plan Note (Addendum)
 Chronic cough likely due to allergic rhinitis, associated with sinus drainage, productive cough, itchy eyes, and nasal congestion. Symptoms align with high pollen season and she is otherwise feeling well. Lungs clear, no smoking history, no trouble breathing so less concern for malignancy, PNA, HF.  - Prescribed Flonase  nasal spray, two sprays each nostril daily at night. Adjust to one spray daily after one to two weeks if symptoms improve. - Instructed to return if symptoms do not improve within two weeks for further evaluation, including potential chest x-ray.

## 2024-03-31 ENCOUNTER — Encounter: Payer: Self-pay | Admitting: Family Medicine

## 2024-03-31 ENCOUNTER — Ambulatory Visit: Admitting: Family Medicine

## 2024-03-31 VITALS — BP 134/59 | Ht 67.0 in | Wt 255.0 lb

## 2024-03-31 DIAGNOSIS — M1710 Unilateral primary osteoarthritis, unspecified knee: Secondary | ICD-10-CM | POA: Diagnosis not present

## 2024-03-31 MED ORDER — METHYLPREDNISOLONE ACETATE 40 MG/ML IJ SUSP
40.0000 mg | Freq: Once | INTRAMUSCULAR | Status: AC
  Administered 2024-03-31: 40 mg via INTRA_ARTICULAR

## 2024-03-31 NOTE — Progress Notes (Signed)
  Kendra Thompson - 73 y.o. female MRN [id number]  Date of birth: 06/16/1951    SUBJECTIVE:      Chief Complaint:/ HPI:  Recurrence of bilateral knee pain.  She would like bilateral corticosteroid injections today.  Has some questions about the injectable gel that she has seen on TV.  Really starting to have some impact on her activities.  Went to a ball game recently and had significant issues getting from the ballpark back to her car because there was a lot of steps going down and then walking up the hill.  She is also been a little unsteady at times and wonders if she should start carrying her mother's cane.  Says she has not been able to make herself do that yet.    OBJECTIVE: BP (!) 134/59 (BP Location: Left Arm, Patient Position: Sitting)   Ht 5\' 7"  (1.702 m)   Wt 255 lb (115.7 kg)   BMI 39.94 kg/m   Physical Exam:  Vital signs are reviewed. GENERAL: Well-developed, no acute distress KNEES: Symmetrical.  No effusion on either knee.  Crepitus bilaterally right greater than left on extension.  She has full range of motion in flexion and extension.  Ligamentously intact to varus and valgus stress.  She has medial joint line tenderness bilaterally and on the right knee she also has some mild lateral joint line tenderness.  Popliteal space is benign.  Distally she is neurovascularly intact.  PROCEDURE: INJECTION: Patient was given informed consent, signed copy in the chart. Appropriate time out was taken. Area prepped and draped in usual sterile fashion. Ethyl chloride was  used for local anesthesia. A 21 gauge 1 1/2 inch needle was used..  1 cc of methylprednisolone  40 mg/ml plus 4 cc of 1% lidocaine  without epinephrine  was injected into the bilateral knees using a(n) anterior medial approach.   The patient tolerated the procedure well. There were no complications. Post procedure instructions were given.   ASSESSMENT & PLAN:  See problem based charting & AVS for pt  instructions. Arthritis of knee, bilateral, chronic Bilateral corticosteroid injections today.  We discussed possibly using gel 3 months from now.  She will consider that.  We will run it through her prior authorization process and make sure her insurance would cover it.  Briefly talked about the fact that she is starting to have such an impact on her ADLs that we may be nearing the end of when corticosteroid injections are helping her for long enough period of time.  Follow-up as needed.

## 2024-03-31 NOTE — Patient Instructions (Signed)
 Today you received an injection with corticosteroid. This injection is usually done in response to pain and inflammation. There is some "numbing" medicine also in the shot so the injected area may be numb and feel really good for the next couple of hours. The numbing medicine usually wears off in 2-3 hours though, and then your pain level will be right back where it was before the injection.   The actually benefit from the steroid injection is usually noticed in 2-7 days. You may actually experience a small (as in 10%) INCREASE in pain in the first 24 hours---that is common.   Things to watch out for that you should contact us or a health care provider urgently would include: 1. Unusual (as in more than 10%) increase in pain 2. New fever > 101.5 3. New swelling or redness of the injected area.  4. Streaking of red lines around the area injected.

## 2024-03-31 NOTE — Assessment & Plan Note (Signed)
 Bilateral corticosteroid injections today.  We discussed possibly using gel 3 months from now.  She will consider that.  We will run it through her prior authorization process and make sure her insurance would cover it.  Briefly talked about the fact that she is starting to have such an impact on her ADLs that we may be nearing the end of when corticosteroid injections are helping her for long enough period of time.  Follow-up as needed.

## 2024-05-08 DIAGNOSIS — H524 Presbyopia: Secondary | ICD-10-CM | POA: Diagnosis not present

## 2024-05-08 DIAGNOSIS — H52223 Regular astigmatism, bilateral: Secondary | ICD-10-CM | POA: Diagnosis not present

## 2024-05-19 ENCOUNTER — Other Ambulatory Visit: Payer: Self-pay | Admitting: Family Medicine

## 2024-05-31 ENCOUNTER — Ambulatory Visit: Admitting: Family Medicine

## 2024-05-31 ENCOUNTER — Ambulatory Visit: Payer: Self-pay | Admitting: Family Medicine

## 2024-05-31 VITALS — BP 123/69 | HR 100 | Ht 67.0 in | Wt 250.6 lb

## 2024-05-31 DIAGNOSIS — I10 Essential (primary) hypertension: Secondary | ICD-10-CM | POA: Diagnosis not present

## 2024-05-31 DIAGNOSIS — Z0000 Encounter for general adult medical examination without abnormal findings: Secondary | ICD-10-CM | POA: Diagnosis not present

## 2024-05-31 DIAGNOSIS — R7309 Other abnormal glucose: Secondary | ICD-10-CM

## 2024-05-31 LAB — POCT GLYCOSYLATED HEMOGLOBIN (HGB A1C): Hemoglobin A1C: 5.9 % — AB (ref 4.0–5.6)

## 2024-05-31 MED ORDER — TRIAMCINOLONE ACETONIDE 0.1 % EX CREA: 1.0000 | TOPICAL_CREAM | Freq: Two times a day (BID) | CUTANEOUS | 0 refills | Status: AC

## 2024-05-31 NOTE — Patient Instructions (Signed)
 I will send you a note about your labs! If  the tingling gets worse in your feet, let me know.  Great to  see you!

## 2024-06-01 ENCOUNTER — Encounter: Payer: Self-pay | Admitting: Family Medicine

## 2024-06-01 NOTE — Progress Notes (Signed)
    CHIEF COMPLAINT / HPI: Here for well adult health check and follow-up on some chronic issues 2.  Recently started using stationary bicycle for about 30 minutes a day most days of the week.  Has seen significant improvement in her knee pain, her mood and her energy level.  Has been contemplating have Ingal gel shots for her knees but now the pain is improved to the level where she might put that off further.  She is quite happy with the results of daily exercise. #2.  Does have a rash on the anterior knee.  At one time she had on both knees.  This occurred after about a months from the time we gave her corticosteroid injection so she is not sure if it is related or not.  She has been using some topical cream hydrocortisone and it has significantly improved but not quite resolved.  PERTINENT  PMH / PSH: I have reviewed the patient's medications, allergies, past medical and surgical history, smoking status and updated in the EMR as appropriate.   OBJECTIVE:  BP 123/69   Pulse 100   Ht 5' 7 (1.702 m)   Wt 250 lb 9.6 oz (113.7 kg)   SpO2 99%   BMI 39.25 kg/m  Vital signs reviewed. GENERAL: Well-developed, well-nourished, no acute distress. CARDIOVASCULAR: Regular rate and rhythm no murmur gallop or rub LUNGS: Clear to auscultation bilaterally, no rales or wheeze. ABDOMEN: Soft positive bowel sounds NEURO: No gross focal neurological deficits. MSK: Movement of extremity x 4.  SKIN: See media tab for image of the rash.  ASSESSMENT / PLAN:  Well adult health check: Updated health maintenance, congratulated her on addition of daily exercise and I hope she continues that.  Continue to follow a healthy diet.  #2.  Regarding the rash, unclear if it was related to the corticosteroid injections.  She has had many of those previously without issue and there was about 30 days from the time of the injection until the rash occurred, however it is right over the area where I injected her knee.   Will try some slightly stronger cortisone type cream for the next week and see if we can get it totally resolved.  If it does not or should it return, she will let me know soon as possible.  Lab work today No problem-specific Assessment & Plan notes found for this encounter.   Camie Mulch MD

## 2024-06-02 LAB — COMPREHENSIVE METABOLIC PANEL WITH GFR
ALT: 32 IU/L (ref 0–32)
AST: 18 IU/L (ref 0–40)
Albumin: 4.4 g/dL (ref 3.9–4.9)
Alkaline Phosphatase: 98 IU/L (ref 44–121)
BUN/Creatinine Ratio: 12 (ref 12–28)
BUN: 11 mg/dL (ref 8–27)
Bilirubin Total: 0.5 mg/dL (ref 0.0–1.2)
CO2: 24 mmol/L (ref 20–29)
Calcium: 9.9 mg/dL (ref 8.7–10.3)
Chloride: 98 mmol/L (ref 96–106)
Creatinine, Ser: 0.89 mg/dL (ref 0.57–1.00)
Globulin, Total: 2.8 g/dL (ref 1.5–4.5)
Glucose: 127 mg/dL — ABNORMAL HIGH (ref 70–99)
Potassium: 4.7 mmol/L (ref 3.5–5.2)
Sodium: 139 mmol/L (ref 134–144)
Total Protein: 7.2 g/dL (ref 6.0–8.5)
eGFR: 70 mL/min/1.73 (ref >59)

## 2024-06-02 LAB — LIPID PANEL
Chol/HDL Ratio: 5.2 ratio — ABNORMAL HIGH (ref 0.0–4.4)
Cholesterol, Total: 198 mg/dL (ref 100–199)
HDL: 38 mg/dL — ABNORMAL LOW (ref >39)
LDL Chol Calc (NIH): 134 mg/dL — ABNORMAL HIGH (ref 0–99)
Triglycerides: 145 mg/dL (ref 0–149)
VLDL Cholesterol Cal: 26 mg/dL (ref 5–40)

## 2024-06-05 ENCOUNTER — Encounter: Payer: Self-pay | Admitting: Family Medicine

## 2024-06-07 ENCOUNTER — Other Ambulatory Visit: Payer: Self-pay | Admitting: Family Medicine

## 2024-06-07 MED ORDER — BETAMETHASONE DIPROPIONATE AUG 0.05 % EX CREA: TOPICAL_CREAM | Freq: Two times a day (BID) | CUTANEOUS | 0 refills | Status: AC

## 2024-07-14 ENCOUNTER — Ambulatory Visit: Admitting: Family Medicine

## 2024-07-14 VITALS — BP 138/60 | Ht 67.0 in | Wt 245.0 lb

## 2024-07-14 DIAGNOSIS — M170 Bilateral primary osteoarthritis of knee: Secondary | ICD-10-CM | POA: Diagnosis not present

## 2024-07-14 DIAGNOSIS — M1710 Unilateral primary osteoarthritis, unspecified knee: Secondary | ICD-10-CM | POA: Diagnosis not present

## 2024-07-14 MED ORDER — SODIUM HYALURONATE (VISCOSUP) 20 MG/2ML IX SOSY
20.0000 mg | PREFILLED_SYRINGE | Freq: Once | INTRA_ARTICULAR | Status: AC
  Administered 2024-07-14: 20 mg via INTRA_ARTICULAR

## 2024-07-14 NOTE — Progress Notes (Signed)
  Kendra Thompson - 73 y.o. female MRN [id number]  Date of birth: May 01, 1951    SUBJECTIVE:      Chief Complaint:/ HPI:   Here for Euflexxa injections    OBJECTIVE: BP 138/60   Ht 5' 7 (1.702 m)   Wt 245 lb (111.1 kg)   BMI 38.37 kg/m   Physical Exam:  Vital signs are reviewed. Knees FROM. No effusion PROCEDURE: INJECTION: Patient was given informed consent, signed copy in the chart. Appropriate time out was taken. Area prepped and draped in usual sterile fashion. Ethyl chloride was  used for local anesthesia. A 21 gauge 1 1/2 inch needle was used to inject 5 cc of 1% lidocaine   without epinephrine  into the bilateral knees for local anesthesia. Then  2 cc of Euflexxa was injected into each knee using an anterior medial  approach.   The patient tolerated the procedure well. There were no complications. Post procedure instructions were given.   ASSESSMENT & PLAN: First in series of three Euflexxa injections into bilateral knees

## 2024-07-14 NOTE — Assessment & Plan Note (Signed)
 Euflexxa #1 bilaterally 07/14/2024

## 2024-07-21 ENCOUNTER — Ambulatory Visit: Admitting: Internal Medicine

## 2024-07-24 ENCOUNTER — Ambulatory Visit: Admitting: Family Medicine

## 2024-07-24 ENCOUNTER — Encounter: Payer: Self-pay | Admitting: Family Medicine

## 2024-07-24 VITALS — BP 137/48 | Ht 67.0 in | Wt 245.0 lb

## 2024-07-24 DIAGNOSIS — M1710 Unilateral primary osteoarthritis, unspecified knee: Secondary | ICD-10-CM | POA: Diagnosis not present

## 2024-07-24 DIAGNOSIS — M170 Bilateral primary osteoarthritis of knee: Secondary | ICD-10-CM

## 2024-07-24 MED ORDER — SODIUM HYALURONATE (VISCOSUP) 20 MG/2ML IX SOSY
20.0000 mg | PREFILLED_SYRINGE | Freq: Once | INTRA_ARTICULAR | Status: AC
  Administered 2024-07-24: 20 mg via INTRA_ARTICULAR

## 2024-07-24 NOTE — Progress Notes (Signed)
 PCP: Rosalynn Camie CROME, MD  Subjective:   HPI: Patient is a 73 y.o. female here for second euflexxa injections.  Patient reports she's doing well. Left knee maybe a little better since first injection. No other concerns.  Past Medical History:  Diagnosis Date   Allergy 1971   Asthma 1971   has not been present for several years   Breast cancer (HCC) 04/18/2021   Cataract 2020   Family history of breast cancer 04/30/2021   Headache    Migraines occasionally   Hypertension    Seasonal allergies    Wears glasses     Current Outpatient Medications on File Prior to Visit  Medication Sig Dispense Refill   amLODipine  (NORVASC ) 10 MG tablet TAKE 1 TABLET BY MOUTH EVERY DAY 90 tablet 2   anastrozole  (ARIMIDEX ) 1 MG tablet TAKE 1 TABLET (1 MG TOTAL) BY MOUTH DAILY. START 08/26/2021 90 tablet 4   augmented betamethasone  dipropionate (DIPROLENE  AF) 0.05 % cream Apply topically 2 (two) times daily. 30 g 0   Candesartan  Cilexetil-HCTZ 32-25 MG TABS TAKE 1 TABLET BY MOUTH EVERY DAY 90 tablet 3   cyclobenzaprine  (FLEXERIL ) 5 MG tablet Take 1 tablet (5 mg total) by mouth 3 (three) times daily as needed for muscle spasms. 30 tablet 0   diclofenac  Sodium (VOLTAREN ) 1 % GEL Apply 2 g topically 4 (four) times daily.     fexofenadine (ALLEGRA) 180 MG tablet Take 180 mg by mouth daily as needed for allergies or rhinitis.     fluticasone  (FLONASE ) 50 MCG/ACT nasal spray Place 2 sprays into both nostrils daily. 16 g 6   gabapentin  (NEURONTIN ) 300 MG capsule TAKE 1 CAPSULE BY MOUTH EVERYDAY AT BEDTIME 90 capsule 4   ibuprofen (ADVIL) 200 MG tablet Take 200-600 mg by mouth every 6 (six) hours as needed for headache or moderate pain.     meloxicam  (MOBIC ) 15 MG tablet Take 1 tablet daily with food for 7 days. Then take as needed. 30 tablet 0   MULTIPLE VITAMIN PO Take 1 tablet by mouth daily.     pantoprazole  (PROTONIX ) 40 MG tablet TAKE 1 TABLET BY MOUTH EVERY DAY 90 tablet 2   triamcinolone  cream (KENALOG )  0.1 % Apply 1 Application topically 2 (two) times daily. 30 g 0   No current facility-administered medications on file prior to visit.    Past Surgical History:  Procedure Laterality Date   BREAST LUMPECTOMY Left 05/20/2021   BREAST LUMPECTOMY WITH RADIOACTIVE SEED LOCALIZATION Left 05/20/2021   Procedure: LEFT BREAST BRACKETED LUMPECTOMY WITH RADIOACTIVE SEED LOCALIZATION x3;  Surgeon: Aron Shoulders, MD;  Location: [facility];  Service: General;  Laterality: Left;   CLOSED REDUCTION FINGER WITH PERCUTANEOUS PINNING Left 07/25/2013   Procedure: CLOSED REDUCTION FINGER WITH PERCUTANEOUS PINNING LEFT SMALL METACARPAL;  Surgeon: Franky JONELLE Curia, MD;  Location: [facility];  Service: Orthopedics;  Laterality: Left;   COLONOSCOPY  2013,2014   DIAGNOSTIC LAPAROSCOPY  1992   explor-   DILATION AND CURETTAGE OF UTERUS     FRACTURE SURGERY  2014   HAND SURGERY  02/2020    Allergies  Allergen Reactions   Latex     rash   Tape Rash    BP (!) 137/48   Ht 5' 7 (1.702 m)   Wt 245 lb (111.1 kg)   BMI 38.37 kg/m       No data to display              No data  to display              Objective:  Physical Exam:  Gen: NAD, comfortable in exam room  Knee exam not repeated today   Assessment & Plan:  1. Bilateral knee osteoarthritis - second euflexxa injections given today.  Follow up in 1 week for third injections.  After informed written consent timeout was performed, patient was seated on exam table. Right knee was prepped with alcohol swab and utilizing anteromedial approach, patient's right knee was injected intraarticularly with 3mL lidocaine  locally followed by euflexxa intraarticularly. Patient tolerated the procedure well without immediate complications.  After informed written consent timeout was performed, patient was seated on exam table. Left knee was prepped with alcohol swab and utilizing anteromedial approach, patient's left knee was injected  intraarticularly with 3mL lidocaine  locally followed by euflexxa intraarticularly. Patient tolerated the procedure well without immediate complications.

## 2024-07-28 ENCOUNTER — Ambulatory Visit

## 2024-07-31 ENCOUNTER — Ambulatory Visit: Admitting: Family Medicine

## 2024-07-31 ENCOUNTER — Encounter: Payer: Self-pay | Admitting: Family Medicine

## 2024-07-31 ENCOUNTER — Other Ambulatory Visit: Payer: Self-pay

## 2024-07-31 VITALS — BP 136/62 | Ht 67.0 in | Wt 245.0 lb

## 2024-07-31 DIAGNOSIS — M1710 Unilateral primary osteoarthritis, unspecified knee: Secondary | ICD-10-CM | POA: Diagnosis not present

## 2024-07-31 DIAGNOSIS — M170 Bilateral primary osteoarthritis of knee: Secondary | ICD-10-CM

## 2024-07-31 MED ORDER — SODIUM HYALURONATE (VISCOSUP) 20 MG/2ML IX SOSY
20.0000 mg | PREFILLED_SYRINGE | Freq: Once | INTRA_ARTICULAR | Status: AC
  Administered 2024-07-31: 20 mg via INTRA_ARTICULAR

## 2024-07-31 NOTE — Progress Notes (Signed)
 PCP: Rosalynn Camie CROME, MD  Subjective:   HPI: Patient is a 73 y.o. female here for bilateral knee arthritis.  Patient returns for third euflexxa injections. Left knee feels a little better - not much change to date with right knee. No new issues.  Past Medical History:  Diagnosis Date   Allergy 1971   Asthma 1971   has not been present for several years   Breast cancer (HCC) 04/18/2021   Cataract 2020   Family history of breast cancer 04/30/2021   Headache    Migraines occasionally   Hypertension    Seasonal allergies    Wears glasses     Current Outpatient Medications on File Prior to Visit  Medication Sig Dispense Refill   amLODipine  (NORVASC ) 10 MG tablet TAKE 1 TABLET BY MOUTH EVERY DAY 90 tablet 2   anastrozole  (ARIMIDEX ) 1 MG tablet TAKE 1 TABLET (1 MG TOTAL) BY MOUTH DAILY. START 08/26/2021 90 tablet 4   augmented betamethasone  dipropionate (DIPROLENE  AF) 0.05 % cream Apply topically 2 (two) times daily. 30 g 0   Candesartan  Cilexetil-HCTZ 32-25 MG TABS TAKE 1 TABLET BY MOUTH EVERY DAY 90 tablet 3   cyclobenzaprine  (FLEXERIL ) 5 MG tablet Take 1 tablet (5 mg total) by mouth 3 (three) times daily as needed for muscle spasms. 30 tablet 0   diclofenac  Sodium (VOLTAREN ) 1 % GEL Apply 2 g topically 4 (four) times daily.     fexofenadine (ALLEGRA) 180 MG tablet Take 180 mg by mouth daily as needed for allergies or rhinitis.     fluticasone  (FLONASE ) 50 MCG/ACT nasal spray Place 2 sprays into both nostrils daily. 16 g 6   gabapentin  (NEURONTIN ) 300 MG capsule TAKE 1 CAPSULE BY MOUTH EVERYDAY AT BEDTIME 90 capsule 4   ibuprofen (ADVIL) 200 MG tablet Take 200-600 mg by mouth every 6 (six) hours as needed for headache or moderate pain.     meloxicam  (MOBIC ) 15 MG tablet Take 1 tablet daily with food for 7 days. Then take as needed. 30 tablet 0   MULTIPLE VITAMIN PO Take 1 tablet by mouth daily.     pantoprazole  (PROTONIX ) 40 MG tablet TAKE 1 TABLET BY MOUTH EVERY DAY 90 tablet 2    triamcinolone  cream (KENALOG ) 0.1 % Apply 1 Application topically 2 (two) times daily. 30 g 0   No current facility-administered medications on file prior to visit.    Past Surgical History:  Procedure Laterality Date   BREAST LUMPECTOMY Left 05/20/2021   BREAST LUMPECTOMY WITH RADIOACTIVE SEED LOCALIZATION Left 05/20/2021   Procedure: LEFT BREAST BRACKETED LUMPECTOMY WITH RADIOACTIVE SEED LOCALIZATION x3;  Surgeon: Aron Shoulders, MD;  Location: [facility];  Service: General;  Laterality: Left;   CLOSED REDUCTION FINGER WITH PERCUTANEOUS PINNING Left 07/25/2013   Procedure: CLOSED REDUCTION FINGER WITH PERCUTANEOUS PINNING LEFT SMALL METACARPAL;  Surgeon: Franky JONELLE Curia, MD;  Location: [facility];  Service: Orthopedics;  Laterality: Left;   COLONOSCOPY  2013,2014   DIAGNOSTIC LAPAROSCOPY  1992   explor-   DILATION AND CURETTAGE OF UTERUS     FRACTURE SURGERY  2014   HAND SURGERY  02/2020    Allergies  Allergen Reactions   Latex     rash   Tape Rash    BP 136/62   Ht 5' 7 (1.702 m)   Wt 245 lb (111.1 kg)   BMI 38.37 kg/m       No data to display  No data to display              Objective:  Physical Exam:  Gen: NAD, comfortable in exam room   Assessment & Plan:  1. Bilateral knee arthritis - euflexxa injections given as below to complete 3 injection series.  Follow up in 4 weeks for reevaluation.  After informed written consent timeout was performed, patient was seated on exam table. Right knee was prepped with alcohol swab and utilizing anteromedial approach, patient's right knee was injected intraarticularly with 3mL lidocaine  followed by euflexxa. Patient tolerated the procedure well without immediate complications.  After informed written consent timeout was performed, patient was seated on exam table. Left knee was prepped with alcohol swab and utilizing anteromedial approach, patient's left knee was injected intraarticularly with 3mL  lidocaine  followed by euflexxa. Patient tolerated the procedure well without immediate complications.

## 2024-08-02 DIAGNOSIS — C50512 Malignant neoplasm of lower-outer quadrant of left female breast: Secondary | ICD-10-CM | POA: Diagnosis not present

## 2024-08-02 DIAGNOSIS — Z809 Family history of malignant neoplasm, unspecified: Secondary | ICD-10-CM | POA: Diagnosis not present

## 2024-08-02 DIAGNOSIS — I890 Lymphedema, not elsewhere classified: Secondary | ICD-10-CM | POA: Diagnosis not present

## 2024-08-02 DIAGNOSIS — Z170 Estrogen receptor positive status [ER+]: Secondary | ICD-10-CM | POA: Diagnosis not present

## 2024-08-07 ENCOUNTER — Ambulatory Visit
Admission: RE | Admit: 2024-08-07 | Discharge: 2024-08-07 | Disposition: A | Source: Ambulatory Visit | Attending: Hematology and Oncology

## 2024-08-07 DIAGNOSIS — R928 Other abnormal and inconclusive findings on diagnostic imaging of breast: Secondary | ICD-10-CM | POA: Diagnosis not present

## 2024-08-07 DIAGNOSIS — R921 Mammographic calcification found on diagnostic imaging of breast: Secondary | ICD-10-CM

## 2024-08-09 ENCOUNTER — Ambulatory Visit: Admitting: Family Medicine

## 2024-08-09 ENCOUNTER — Other Ambulatory Visit: Payer: Self-pay | Admitting: Hematology and Oncology

## 2024-08-09 ENCOUNTER — Encounter: Payer: Self-pay | Admitting: Family Medicine

## 2024-08-09 VITALS — BP 135/70 | HR 80 | Ht 67.0 in | Wt 244.8 lb

## 2024-08-09 DIAGNOSIS — K5792 Diverticulitis of intestine, part unspecified, without perforation or abscess without bleeding: Secondary | ICD-10-CM | POA: Diagnosis not present

## 2024-08-09 DIAGNOSIS — R921 Mammographic calcification found on diagnostic imaging of breast: Secondary | ICD-10-CM

## 2024-08-09 DIAGNOSIS — Z860100 Personal history of colon polyps, unspecified: Secondary | ICD-10-CM | POA: Diagnosis not present

## 2024-08-09 NOTE — Progress Notes (Unsigned)
   Discussed the use of AI scribe software for clinical note transcription with the patient, who gave verbal consent to proceed.  History of Present Illness   Kendra Thompson is a 73 year old female who presents for follow-up regarding her knee condition and weight loss.  Knee pain and mobility - Significant improvement in knee condition with increased mobility - Daily use of ODALIT cream - No physical therapy required  Weight loss - Weight decreased from 226 pounds in December to 200 pounds currently - Weight loss attributed to increased mobility - Mother has noticed the weight loss, although patient does not perceive it herself  Preventive health maintenance - Interested in receiving a flu vaccination - Open to hemoglobin testing, last checked in February - Agrees to comprehensive metabolic panel to assess kidney function, electrolytes, and liver function        PERTINENT  PMH / PSH: I have reviewed the patient's medications, allergies, past medical and surgical history, smoking status.  Pertinent findings that relate to today's visit / issues include:   Physical Exam   MEASUREMENTS: Weight- 200. MUSCULOSKELETAL: Knee with normal range of motion.         Assessment and Plan    Status post knee surgery Post-operative recovery progressing well with improved mobility and range of motion. - Continue daily application of ODALIT topical cream.  Obesity Weight reduced from 226 to 200 pounds. Improved mobility post-surgery likely aided weight loss. - Emphasized importance of maintaining mobility for weight management.  General Health Maintenance Blood work due for evaluation. - Administer flu shot. - Order CMP to assess kidney function, electrolytes, blood sugar, and liver function. - Order hemoglobin test.

## 2024-08-10 ENCOUNTER — Other Ambulatory Visit: Payer: Self-pay | Admitting: Family Medicine

## 2024-08-10 ENCOUNTER — Encounter: Payer: Self-pay | Admitting: Family Medicine

## 2024-08-10 DIAGNOSIS — R1032 Left lower quadrant pain: Secondary | ICD-10-CM

## 2024-08-10 DIAGNOSIS — K5792 Diverticulitis of intestine, part unspecified, without perforation or abscess without bleeding: Secondary | ICD-10-CM

## 2024-08-10 DIAGNOSIS — R634 Abnormal weight loss: Secondary | ICD-10-CM

## 2024-08-14 ENCOUNTER — Ambulatory Visit
Admission: RE | Admit: 2024-08-14 | Discharge: 2024-08-14 | Disposition: A | Discharge status: Home / Self Care | Source: Ambulatory Visit | Attending: Family Medicine | Admitting: Family Medicine

## 2024-08-14 DIAGNOSIS — R634 Abnormal weight loss: Secondary | ICD-10-CM

## 2024-08-14 DIAGNOSIS — K760 Fatty (change of) liver, not elsewhere classified: Secondary | ICD-10-CM | POA: Diagnosis not present

## 2024-08-14 DIAGNOSIS — R1032 Left lower quadrant pain: Secondary | ICD-10-CM

## 2024-08-14 DIAGNOSIS — K5792 Diverticulitis of intestine, part unspecified, without perforation or abscess without bleeding: Secondary | ICD-10-CM

## 2024-08-14 MED ORDER — IOPAMIDOL (ISOVUE-370) INJECTION 76%
80.0000 mL | Freq: Once | INTRAVENOUS | Status: AC | PRN
  Administered 2024-08-14: 80 mL via INTRAVENOUS

## 2024-08-17 ENCOUNTER — Telehealth: Payer: Self-pay

## 2024-08-17 ENCOUNTER — Encounter: Payer: Self-pay | Admitting: Family Medicine

## 2024-08-17 NOTE — Telephone Encounter (Signed)
 CT results noted. Called pt and left HIPAA compliant vm. Will try calling her again later and disuccs likely dx of diverticulitis; discuss abx.

## 2024-08-17 NOTE — Telephone Encounter (Signed)
 Received call from [facility] regarding call report from recent CT abdomen.   See below impression.   IMPRESSION: 1. Diverticular disease of the colon. Subtle fat stranding at the sigmoid colon centered around several diverticula, suspect for mild diverticulitis. No perforation or abscess. 2. Hepatic steatosis and hepatomegaly. 3. Aortic atherosclerosis.  Forwarding to PCP.   Kendra JAYSON English, RN

## 2024-08-17 NOTE — Telephone Encounter (Signed)
 Patient returns call to nurse line and LVM.  Will forward back to PCP to try again later.

## 2024-08-18 MED ORDER — METRONIDAZOLE 500 MG PO TABS: 500.0000 mg | ORAL_TABLET | Freq: Two times a day (BID) | ORAL | 0 refills | Status: DC

## 2024-08-18 MED ORDER — LEVOFLOXACIN 500 MG PO TABS: 500.0000 mg | ORAL_TABLET | Freq: Every day | ORAL | 0 refills | Status: DC

## 2024-08-18 NOTE — Telephone Encounter (Signed)
 Connected via phone Has had increasing diarrhea today, some cramping. Felt cold and took temp and it was normal. We discussed using 1 or 2 doses of immodium (OTC) but not a lot of doses.  Abx called in and she is in agreement to start today. Reviewed red flags that would necessitate ED visit (significant increase in pain, fever etc).

## 2024-09-07 ENCOUNTER — Other Ambulatory Visit: Payer: Self-pay | Admitting: Family Medicine

## 2024-09-25 NOTE — Progress Notes (Unsigned)
 [name], PA-C [address] [place], [place]  [address] Phone: [phone]   Primary Care Physician: Rosalynn Camie CROME, MD  Primary Gastroenterologist:  [name], PA-C / Dr. Gordy Starch   Chief Complaint: Follow-up diverticulitis, diarrhea, and fatty liver     HPI:   Discussed the use of AI scribe software for clinical note transcription with the patient, who gave verbal consent to proceed.  73 year old female is referred by her PCP to follow-up for diverticulitis, fatty liver, and diarrhea.  She saw her PCP Dr. Rosalynn 08/09/2024 to evaluate intermittent LLQ pain for 2 to 3 weeks.  08/14/2024 CT abdomen pelvis with contrast showed subtle fat stranding in the sigmoid colon around several diverticula, suspicious for mild diverticulitis.  No perforation or abscess.  There was hepatic steatosis and hepatomegaly of 20 cm.  No gallstones.  Otherwise normal CT.  Diverticulitis was treated with Levaquin  500 mg once daily and metronidazole  500 mg twice daily for 7 days.  History of Present Illness She has been experiencing left lower quadrant pain since September, which she associates with her history of diverticulitis. The pain is described as dull and is accompanied by gas and diarrhea. It has been 35 years since her last episode of diverticulitis.  She experienced daily diarrhea from September into October, describing it as 'everything I eat shooting through me like a rocket,' resulting in a weight loss of approximately 25 pounds. Treatment with Levaquin  and metronidazole  provided some improvement.  She continues to have persistent LLQ pain, gas, and intermittent diarrhea.  She denies rectal bleeding.  No fever, chills, nausea, or vomiting.  Before September, she had no previous history of diarrhea.  Her diarrhea symptoms started in September when she was visiting her sister in [place] .  No antibiotic use prior to onset of symptoms.  She suspects lactose intolerance as a potential cause  of her symptoms, noting that consuming cream in her coffee triggers diarrhea. She has been using lactose-free milk and lactase supplements, which have helped manage her symptoms. She reports normal bowel movements when avoiding dairy products.    No blood in stool, hives, or itching suggestive of a food allergy. She has not had any recent tick bites and has been avoiding red meat since a CT scan showed mild fatty liver disease.  02/16/2023 last colonoscopy by Dr. Aneita: 5 sessile polyps (5 mm to 8 mm) removed.  Pathology showed 2 small tubular adenomas and 3 hyperplastic polyps.  Left-sided diverticulosis.  Small internal hemorrhoids.  Adequate prep.  7-year repeat (due 01/2030).  05/2024 labs: Normal LFTs.  Current Outpatient Medications  Medication Sig Dispense Refill   amLODipine  (NORVASC ) 10 MG tablet TAKE 1 TABLET BY MOUTH EVERY DAY 90 tablet 2   anastrozole  (ARIMIDEX ) 1 MG tablet TAKE 1 TABLET (1 MG TOTAL) BY MOUTH DAILY. START 08/26/2021 90 tablet 4   augmented betamethasone  dipropionate (DIPROLENE  AF) 0.05 % cream Apply topically 2 (two) times daily. 30 g 0   Candesartan  Cilexetil-HCTZ 32-25 MG TABS TAKE 1 TABLET BY MOUTH EVERY DAY 90 tablet 3   cyclobenzaprine  (FLEXERIL ) 5 MG tablet Take 1 tablet (5 mg total) by mouth 3 (three) times daily as needed for muscle spasms. 30 tablet 0   diclofenac  Sodium (VOLTAREN ) 1 % GEL Apply 2 g topically 4 (four) times daily.     fexofenadine (ALLEGRA) 180 MG tablet Take 180 mg by mouth daily as needed for allergies or rhinitis.     ibuprofen (ADVIL) 200  MG tablet Take 200-600 mg by mouth every 6 (six) hours as needed for headache or moderate pain.     MULTIPLE VITAMIN PO Take 1 tablet by mouth daily.     pantoprazole  (PROTONIX ) 40 MG tablet TAKE 1 TABLET BY MOUTH EVERY DAY 90 tablet 2   triamcinolone  cream (KENALOG ) 0.1 % Apply 1 Application topically 2 (two) times daily. 30 g 0   No current facility-administered medications for this visit.     Allergies as of 09/26/2024 - Review Complete 09/26/2024  Allergen Reaction Noted   Latex  05/20/2021   Tape Rash 12/15/2013    Past Medical History:  Diagnosis Date   Allergy 1971   Asthma 1971   has not been present for several years   Breast cancer (HCC) 04/18/2021   Cataract 2020   Family history of breast cancer 04/30/2021   Headache    Migraines occasionally   Hypertension    Seasonal allergies    Wears glasses     Past Surgical History:  Procedure Laterality Date   BREAST LUMPECTOMY Left 05/20/2021   BREAST LUMPECTOMY WITH RADIOACTIVE SEED LOCALIZATION Left 05/20/2021   Procedure: LEFT BREAST BRACKETED LUMPECTOMY WITH RADIOACTIVE SEED LOCALIZATION x3;  Surgeon: Aron Shoulders, MD;  Location: [facility];  Service: General;  Laterality: Left;   CLOSED REDUCTION FINGER WITH PERCUTANEOUS PINNING Left 07/25/2013   Procedure: CLOSED REDUCTION FINGER WITH PERCUTANEOUS PINNING LEFT SMALL METACARPAL;  Surgeon: Franky JONELLE Curia, MD;  Location: [facility];  Service: Orthopedics;  Laterality: Left;   COLONOSCOPY  2013,2014   DIAGNOSTIC LAPAROSCOPY  1992   explor-   DILATION AND CURETTAGE OF UTERUS     FRACTURE SURGERY  2014   HAND SURGERY  02/2020    Review of Systems:    All systems reviewed and negative except where noted in HPI.    Physical Exam:  BP (!) 130/58   Pulse 64   Ht 5' 7 (1.702 m)   Wt 239 lb 2 oz (108.5 kg)   BMI 37.45 kg/m  No LMP recorded. Patient is postmenopausal.  General: Well-nourished, well-developed, obese, in no acute distress.  Lungs: Clear to auscultation bilaterally. Non-labored. Heart: Regular rate and rhythm, no murmurs rubs or gallops.  Abdomen: Bowel sounds are normal; Abdomen is Soft; No hepatosplenomegaly, masses or hernias;  Moderate LLQ Abdominal Tenderness; rest of abdomen is not tender.  No guarding or rebound tenderness. Neuro: Alert and oriented x 3.  Grossly intact.  Psych: Alert and cooperative, normal mood and  affect.   Imaging Studies: No results found.  Labs: CBC    Component Value Date/Time   WBC 8.2 03/11/2023 1143   WBC 9.2 04/30/2021 0754   RBC 4.42 03/11/2023 1143   RBC 4.48 04/30/2021 0754   HGB 13.0 03/11/2023 1143   HCT 38.7 03/11/2023 1143   PLT 304 03/11/2023 1143   MCV 88 03/11/2023 1143   MCH 29.4 03/11/2023 1143   MCH 29.9 04/30/2021 0754   MCHC 33.6 03/11/2023 1143   MCHC 33.4 04/30/2021 0754   RDW 12.6 03/11/2023 1143   LYMPHSABS 2.5 04/30/2021 0754   MONOABS 0.8 04/30/2021 0754   EOSABS 0.2 04/30/2021 0754   BASOSABS 0.1 04/30/2021 0754    CMP     Component Value Date/Time   NA 139 05/31/2024 1104   K 4.7 05/31/2024 1104   CL 98 05/31/2024 1104   CO2 24 05/31/2024 1104   GLUCOSE 127 (H) 05/31/2024 1104   GLUCOSE 124 (H) 04/30/2021 0754  BUN 11 05/31/2024 1104   CREATININE 0.89 05/31/2024 1104   CREATININE 0.93 04/30/2021 0754   CREATININE 0.76 09/09/2016 0928   CALCIUM 9.9 05/31/2024 1104   PROT 7.2 05/31/2024 1104   ALBUMIN 4.4 05/31/2024 1104   AST 18 05/31/2024 1104   AST 17 04/30/2021 0754   ALT 32 05/31/2024 1104   ALT 20 04/30/2021 0754   ALKPHOS 98 05/31/2024 1104   BILITOT 0.5 05/31/2024 1104   BILITOT 0.6 04/30/2021 0754   GFRNONAA >60 04/30/2021 0754   GFRNONAA 84 09/09/2016 0928   GFRAA 81 02/19/2020 0915   GFRAA >89 09/09/2016 0928     Assessment and Plan:   Kendra Thompson is a 73 y.o. y/o female presents for follow-up of:  Persistent LLQ PAIN - Post treatment for diverticulitis Diarrhea x 3 months; Improved yet persistent Weight Loss - Down 25 pounds unintentional in 3 months Diverticulitis - Treated with Levaquin  and Flagyl  07/2024; Having moderate LLQ tenderness on exam today. 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  I am concerned about persistent sigmoid diverticulitis given her persistent LLQ pain and tenderness on exam.  I am ordering labs and repeating abdominal pelvic CT to evaluate for  abscess or perforation.  If CT shows persistent diverticulitis, then I recommend treat with Augmentin .  She has no antibiotic allergies.  Regarding diarrhea, this may be due to lactose intolerance.  However, C. difficile, infectious pathogen, IBD, IBS, and EPI are also in the differential.  I am ordering stool studies for further evaluation.  Plan: - CBC, CMP, CRP - Stool test: C. Diff PCR, GI Path Panal, fecal Calprotectin, Pancreatic Elastase - Repeat CT abd / pelvis for persistent LLQ pain and tenderness - Rx Augmentin  if diverticulitis persistent (No Abx allergies) - Pt. Education given and discussed regarding diverticulitis and fatty liver disease. - Continue low Fat Diet. - Recommend softer diet as long as she has LLQ pain.  Advance to high-fiber diet once LLQ pain is resolved. -Continue to avoid milk, dairy, and lactose.  Continue Lactaid supplement, Lactaid milk, and almond milk. - 7-year repeat surveillance colonoscopy will be due 01/2030.  She had 2 small adenomatous polyps removed during 01/2023. - After her acute GI symptoms have resolved, then recommend RUQ liver ultrasound with elastography to evaluate for fibrosis at some point in the future.  It is reassuring her LFTs are normal.  No evidence of cirrhosis.    [name], PA-C  Follow up OV in 6 weeks with TG.  Also follow-up based on test results and GI symptoms.

## 2024-09-26 ENCOUNTER — Ambulatory Visit: Payer: Self-pay | Admitting: Physician Assistant

## 2024-09-26 ENCOUNTER — Ambulatory Visit: Admitting: Physician Assistant

## 2024-09-26 ENCOUNTER — Encounter: Payer: Self-pay | Admitting: Physician Assistant

## 2024-09-26 ENCOUNTER — Other Ambulatory Visit

## 2024-09-26 VITALS — BP 130/58 | HR 64 | Ht 67.0 in | Wt 239.1 lb

## 2024-09-26 DIAGNOSIS — R197 Diarrhea, unspecified: Secondary | ICD-10-CM

## 2024-09-26 DIAGNOSIS — R634 Abnormal weight loss: Secondary | ICD-10-CM

## 2024-09-26 DIAGNOSIS — K5732 Diverticulitis of large intestine without perforation or abscess without bleeding: Secondary | ICD-10-CM

## 2024-09-26 DIAGNOSIS — R1032 Left lower quadrant pain: Secondary | ICD-10-CM

## 2024-09-26 DIAGNOSIS — E739 Lactose intolerance, unspecified: Secondary | ICD-10-CM

## 2024-09-26 DIAGNOSIS — K760 Fatty (change of) liver, not elsewhere classified: Secondary | ICD-10-CM

## 2024-09-26 LAB — COMPREHENSIVE METABOLIC PANEL WITH GFR
ALT: 21 U/L (ref 0–35)
AST: 14 U/L (ref 0–37)
Albumin: 4.4 g/dL (ref 3.5–5.2)
Alkaline Phosphatase: 78 U/L (ref 39–117)
BUN: 18 mg/dL (ref 6–23)
CO2: 30 meq/L (ref 19–32)
Calcium: 9.7 mg/dL (ref 8.4–10.5)
Chloride: 97 meq/L (ref 96–112)
Creatinine, Ser: 0.78 mg/dL (ref 0.40–1.20)
GFR: 76.48 mL/min (ref >60.00)
Glucose, Bld: 119 mg/dL — ABNORMAL HIGH (ref 70–99)
Potassium: 3.8 meq/L (ref 3.5–5.1)
Sodium: 136 meq/L (ref 135–145)
Total Bilirubin: 0.4 mg/dL (ref 0.2–1.2)
Total Protein: 7.9 g/dL (ref 6.0–8.3)

## 2024-09-26 LAB — CBC WITH DIFFERENTIAL/PLATELET
Basophils Absolute: 0.1 K/uL (ref 0.0–0.1)
Basophils Relative: 1.1 % (ref 0.0–3.0)
Eosinophils Absolute: 0.2 K/uL (ref 0.0–0.7)
Eosinophils Relative: 1.7 % (ref 0.0–5.0)
HCT: 41.6 % (ref 36.0–46.0)
Hemoglobin: 13.9 g/dL (ref 12.0–15.0)
Lymphocytes Relative: 22.3 % (ref 12.0–46.0)
Lymphs Abs: 2.3 K/uL (ref 0.7–4.0)
MCHC: 33.5 g/dL (ref 30.0–36.0)
MCV: 88.9 fl (ref 78.0–100.0)
Monocytes Absolute: 0.9 K/uL (ref 0.1–1.0)
Monocytes Relative: 9.1 % (ref 3.0–12.0)
Neutro Abs: 6.7 K/uL (ref 1.4–7.7)
Neutrophils Relative %: 65.8 % (ref 43.0–77.0)
Platelets: 281 K/uL (ref 150.0–400.0)
RBC: 4.68 Mil/uL (ref 3.87–5.11)
RDW: 14.6 % (ref 11.5–15.5)
WBC: 10.2 K/uL (ref 4.0–10.5)

## 2024-09-26 LAB — C-REACTIVE PROTEIN: CRP: 1.5 mg/dL (ref 0.5–20.0)

## 2024-09-26 NOTE — Patient Instructions (Signed)
 Your provider has requested that you go to the basement level for lab work before leaving today. Press B on the elevator. The lab is located at the first door on the left as you exit the elevator.  You have been scheduled for a CT scan of the abdomen and pelvis at [facility], 1st floor Radiology. You are scheduled on 09/29/24 at 8:00 am. You should arrive 15 minutes prior to your appointment time for registration. If you have any questions regarding your exam or if you need to reschedule, you may call [facility] at [phone] between the hours of 8:00 am and 5:00 pm, Monday-Friday.   Please follow up sooner if symptoms increase or worsen  Due to recent changes in healthcare laws, you may see the results of your imaging and laboratory studies on MyChart before your provider has had a chance to review them.  We understand that in some cases there may be results that are confusing or concerning to you. Not all laboratory results come back in the same time frame and the provider may be waiting for multiple results in order to interpret others.  Please give us  48 hours in order for your provider to thoroughly review all the results before contacting the office for clarification of your results.   Thank you for trusting me with your gastrointestinal care!   [name], PA-C _______________________________________________________  If your blood pressure at your visit was 140/90 or greater, please contact your primary care physician to follow up on this.  _______________________________________________________  If you are age 69 or older, your body mass index should be between 23-30. Your Body mass index is 37.45 kg/m. If this is out of the aforementioned range listed, please consider follow up with your Primary Care Provider.  If you are age 20 or younger, your body mass index should be between 19-25. Your Body mass index is 37.45 kg/m. If this is out of the aformentioned  range listed, please consider follow up with your Primary Care Provider.   ________________________________________________________  The [facility] providers would like to encourage you to use MYCHART to communicate with providers for non-urgent requests or questions.  Due to long hold times on the telephone, sending your provider a message by [facility] may be a faster and more efficient way to get a response.  Please allow 48 business hours for a response.  Please remember that this is for non-urgent requests.  _______________________________________________________

## 2024-09-27 ENCOUNTER — Other Ambulatory Visit

## 2024-09-27 DIAGNOSIS — R197 Diarrhea, unspecified: Secondary | ICD-10-CM | POA: Diagnosis not present

## 2024-09-29 ENCOUNTER — Ambulatory Visit (HOSPITAL_COMMUNITY)

## 2024-09-30 LAB — GI PROFILE, STOOL, PCR

## 2024-09-30 LAB — CALPROTECTIN, FECAL: Calprotectin, Fecal: 47 ug/g (ref 0–120)

## 2024-10-01 LAB — PANCREATIC ELASTASE, FECAL: Pancreatic Elastase-1, Stool: >800 ug/g (ref >200)

## 2024-10-02 ENCOUNTER — Ambulatory Visit (HOSPITAL_COMMUNITY)
Admission: RE | Admit: 2024-10-02 | Discharge: 2024-10-02 | Disposition: A | Source: Ambulatory Visit | Attending: Physician Assistant | Admitting: Physician Assistant

## 2024-10-02 DIAGNOSIS — K5732 Diverticulitis of large intestine without perforation or abscess without bleeding: Secondary | ICD-10-CM | POA: Diagnosis not present

## 2024-10-02 DIAGNOSIS — K5730 Diverticulosis of large intestine without perforation or abscess without bleeding: Secondary | ICD-10-CM | POA: Diagnosis not present

## 2024-10-02 DIAGNOSIS — R1032 Left lower quadrant pain: Secondary | ICD-10-CM | POA: Diagnosis not present

## 2024-10-02 DIAGNOSIS — K921 Melena: Secondary | ICD-10-CM | POA: Diagnosis not present

## 2024-10-02 MED ORDER — SODIUM CHLORIDE (PF) 0.9 % IJ SOLN
INTRAMUSCULAR | Status: AC
  Filled 2024-10-02: qty 50

## 2024-10-02 MED ORDER — IOHEXOL 300 MG/ML  SOLN
100.0000 mL | Freq: Once | INTRAMUSCULAR | Status: AC | PRN
  Administered 2024-10-02: 100 mL via INTRAVENOUS

## 2024-10-02 MED ORDER — IOHEXOL 9 MG/ML PO SOLN
500.0000 mL | ORAL | Status: AC
  Administered 2024-10-02 (×2): 500 mL via ORAL

## 2024-10-03 ENCOUNTER — Ambulatory Visit (HOSPITAL_COMMUNITY)

## 2024-10-24 ENCOUNTER — Ambulatory Visit
Admission: RE | Admit: 2024-10-24 | Discharge: 2024-10-24 | Disposition: A | Discharge status: Home / Self Care | Source: Ambulatory Visit | Attending: Emergency Medicine | Admitting: Emergency Medicine

## 2024-10-24 VITALS — BP 136/65 | HR 81 | Temp 98.3°F | Resp 16

## 2024-10-24 DIAGNOSIS — R6889 Other general symptoms and signs: Secondary | ICD-10-CM

## 2024-10-24 DIAGNOSIS — J069 Acute upper respiratory infection, unspecified: Secondary | ICD-10-CM | POA: Diagnosis not present

## 2024-10-24 LAB — POCT INFLUENZA A/B
Influenza A, POC: NEGATIVE
Influenza B, POC: NEGATIVE

## 2024-10-24 MED ORDER — BENZONATATE 100 MG PO CAPS: 100.0000 mg | ORAL_CAPSULE | Freq: Three times a day (TID) | ORAL | 0 refills | Status: AC

## 2024-10-24 NOTE — ED Provider Notes (Signed)
 " [facility]    CSN: [id number] Arrival date & time: 10/24/24  1426      History   Chief Complaint Chief Complaint  Patient presents with   Nasal Congestion    with headache and cough. - Entered by patient   Cough   Otalgia    HPI Kendra Thompson is a 73 y.o. female.   73 year old female, Kendra Thompson, presents to urgent care for sneezing, cough, and post nasal drainage x 3 days. Bilateral ear pain worse today.  The history is provided by the patient. No language interpreter was used.  Cough Associated symptoms: ear pain   Associated symptoms: no fever   Otalgia Associated symptoms: cough   Associated symptoms: no fever     Past Medical History:  Diagnosis Date   Allergy 1971   Asthma 1971   has not been present for several years   Breast cancer (HCC) 04/18/2021   Cataract 2020   Family history of breast cancer 04/30/2021   Headache    Migraines occasionally   Hypertension    Seasonal allergies    Wears glasses     Patient Active Problem List   Diagnosis Date Noted   Flu-like symptoms 10/24/2024   Viral URI with cough 10/24/2024   Chronic cough 03/22/2024   Mouth lesion 03/22/2024   Gastrocnemius strain, left, initial encounter 12/28/2023   Hamstring strain, left, initial encounter 12/28/2023   Bunion of great toe of left foot 10/09/2023   Metatarsalgia of left foot 10/09/2023   Well adult health check 04/21/2023   Palpitations 03/11/2023   Lymphedema of breast 06/16/2021   Family history of breast cancer 04/30/2021   Malignant neoplasm of lower-outer quadrant of left breast of female, estrogen receptor positive (HCC) 04/30/2021   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   Postmenopausal 12/29/2019   Gastroesophageal reflux disease without esophagitis 12/29/2019   Degenerative arthritis of thumb 10/01/2017   Arthritis of knee, bilateral, chronic 09/11/2016   Obesity 08/03/2015   History of colon polyps 06/20/2012    Asthma 08/01/2010   HYPERGLYCEMIA, BORDERLINE 06/14/2008   DE QUERVAIN'S TENOSYNOVITIS, LEFT WRIST 07/18/2007   Essential hypertension 06/16/2007    Past Surgical History:  Procedure Laterality Date   BREAST LUMPECTOMY Left 05/20/2021   BREAST LUMPECTOMY WITH RADIOACTIVE SEED LOCALIZATION Left 05/20/2021   Procedure: LEFT BREAST BRACKETED LUMPECTOMY WITH RADIOACTIVE SEED LOCALIZATION x3;  Surgeon: Aron Shoulders, MD;  Location: [facility] OR;  Service: General;  Laterality: Left;   CLOSED REDUCTION FINGER WITH PERCUTANEOUS PINNING Left 07/25/2013   Procedure: CLOSED REDUCTION FINGER WITH PERCUTANEOUS PINNING LEFT SMALL METACARPAL;  Surgeon: Franky JONELLE Curia, MD;  Location: [facility];  Service: Orthopedics;  Laterality: Left;   COLONOSCOPY  2013,2014   DIAGNOSTIC LAPAROSCOPY  1992   explor-   DILATION AND CURETTAGE OF UTERUS     FRACTURE SURGERY  2014   HAND SURGERY  02/2020    OB History   No obstetric history on file.      Home Medications    Prior to Admission medications  Medication Sig Start Date End Date Taking? Authorizing Provider  benzonatate  (TESSALON ) 100 MG capsule Take 1 capsule (100 mg total) by mouth every 8 (eight) hours. 10/24/24  Yes Yarely Bebee, Rilla, NP  amLODipine  (NORVASC ) 10 MG tablet TAKE 1 TABLET BY MOUTH EVERY DAY 09/07/24   Rosalynn Camie LITTIE, MD  anastrozole  (ARIMIDEX ) 1 MG tablet TAKE 1 TABLET (1 MG TOTAL) BY MOUTH DAILY.  START 08/26/2021 11/26/23   Iruku, Praveena, MD  augmented betamethasone  dipropionate (DIPROLENE  AF) 0.05 % cream Apply topically 2 (two) times daily. 06/07/24   Rosalynn Camie CROME, MD  Candesartan  Cilexetil-HCTZ 32-25 MG TABS TAKE 1 TABLET BY MOUTH EVERY DAY 05/19/24   Rosalynn Camie CROME, MD  cyclobenzaprine  (FLEXERIL ) 5 MG tablet Take 1 tablet (5 mg total) by mouth 3 (three) times daily as needed for muscle spasms. 02/26/23   Rosalynn Camie CROME, MD  diclofenac  Sodium (VOLTAREN ) 1 % GEL Apply 2 g topically 4 (four) times daily.    [provider]  fexofenadine (ALLEGRA) 180 MG tablet Take 180 mg by mouth daily as needed for allergies or rhinitis.    [provider]  ibuprofen (ADVIL) 200 MG tablet Take 200-600 mg by mouth every 6 (six) hours as needed for headache or moderate pain.    [provider]  MULTIPLE VITAMIN PO Take 1 tablet by mouth daily.    [provider]  pantoprazole  (PROTONIX ) 40 MG tablet TAKE 1 TABLET BY MOUTH EVERY DAY 09/07/24   Rosalynn Camie CROME, MD  triamcinolone  cream (KENALOG ) 0.1 % Apply 1 Application topically 2 (two) times daily. 05/31/24   Rosalynn Camie CROME, MD    Family History Family History  Problem Relation Age of Onset   Breast cancer Mother 21   Arthritis Mother    Cancer Mother    Depression Mother    Heart disease Mother    Hypertension Mother    Heart attack Father    CAD Father        Diagnosed in early 75s   Arthritis Father    Hearing loss Father    Heart disease Father    Esophageal cancer Brother 10   Other Brother 81       neuroendocrine tumor   Alcohol abuse Brother    Breast cancer Maternal Grandmother        dx after 50   Diabetes Maternal Grandmother    Heart disease Maternal Grandmother    Hypertension Maternal Grandmother    Colon cancer Neg Hx    Stomach cancer Neg Hx    Rectal cancer Neg Hx     Social History Social History[1]   Allergies   Latex and Tape   Review of Systems Review of Systems  Constitutional:  Negative for fever.  HENT:  Positive for ear pain.   Respiratory:  Positive for cough.   All other systems reviewed and are negative.    Physical Exam Triage Vital Signs ED Triage Vitals  Encounter Vitals Group     BP 10/24/24 1525 136/65     Girls Systolic BP Percentile --      Girls Diastolic BP Percentile --      Boys Systolic BP Percentile --      Boys Diastolic BP Percentile --      Pulse Rate 10/24/24 1525 81     Resp 10/24/24 1525 16     Temp 10/24/24 1525 98.3 F (36.8 C)     Temp Source 10/24/24 1525 Oral      SpO2 10/24/24 1525 95 %     Weight --      Height --      Head Circumference --      Peak Flow --      Pain Score 10/24/24 1537 5     Pain Loc --      Pain Education --      Exclude from Growth Chart --  No data found.  Updated Vital Signs BP 136/65 (BP Location: Right Arm)   Pulse 81   Temp 98.3 F (36.8 C) (Oral)   Resp 16   SpO2 95%   Visual Acuity Right Eye Distance:   Left Eye Distance:   Bilateral Distance:    Right Eye Near:   Left Eye Near:    Bilateral Near:     Physical Exam Vitals and nursing note reviewed.  Constitutional:      General: She is not in acute distress.    Appearance: She is well-developed and well-groomed.  HENT:     Head: Normocephalic.     Right Ear: Tympanic membrane is retracted.     Left Ear: Tympanic membrane is retracted.     Nose: Congestion present.     Mouth/Throat:     Lips: Pink.     Mouth: Mucous membranes are moist.     Pharynx: Oropharynx is clear.  Eyes:     General: Lids are normal.     Conjunctiva/sclera: Conjunctivae normal.     Pupils: Pupils are equal, round, and reactive to light.  Neck:     Trachea: No tracheal deviation.  Cardiovascular:     Rate and Rhythm: Normal rate and regular rhythm.     Heart sounds: Normal heart sounds. No murmur heard. Pulmonary:     Effort: Pulmonary effort is normal.     Breath sounds: Normal breath sounds and air entry.  Abdominal:     General: Bowel sounds are normal.     Palpations: Abdomen is soft.     Tenderness: There is no abdominal tenderness.  Musculoskeletal:        General: Normal range of motion.     Cervical back: Normal range of motion.  Lymphadenopathy:     Cervical: No cervical adenopathy.  Skin:    General: Skin is warm and dry.     Findings: No rash.  Neurological:     General: No focal deficit present.     Mental Status: She is alert and oriented to person, place, and time.     GCS: GCS eye subscore is 4. GCS verbal subscore is 5. GCS motor subscore  is 6.  Psychiatric:        Attention and Perception: Attention normal.        Mood and Affect: Mood normal.        Speech: Speech normal.        Behavior: Behavior normal. Behavior is cooperative.      UC Treatments / Results  Labs (all labs ordered are listed, but only abnormal results are displayed) Labs Reviewed  POCT INFLUENZA A/B    EKG   Radiology No results found.  Procedures Procedures (including critical care time)  Medications Ordered in UC Medications - No data to display  Initial Impression / Assessment and Plan / UC Course  I have reviewed the triage vital signs and the nursing notes.  Pertinent labs & imaging results that were available during my care of the patient were reviewed by me and considered in my medical decision making (see chart for details).    Discussed exam findings and plan of care with patient, influenza test is negative , Tessalon  scripted , strict go to ER precautions given.   Patient verbalized understanding to this provider.  Ddx: Viral URI w cough, flulike illness, allergies Final Clinical Impressions(s) / UC Diagnoses   Final diagnoses:  Flu-like symptoms  Viral URI with cough  Discharge Instructions      Your flu test is negative Rest, push fluids, most likely this is a viral illness and will need to run its course.  Take Tessalon  as prescribed, if you develop chest pain, shortness of breath, or worsening symptoms go to emergency room for further evaluation.     ED Prescriptions     Medication Sig Dispense Auth. Provider   benzonatate  (TESSALON ) 100 MG capsule Take 1 capsule (100 mg total) by mouth every 8 (eight) hours. 21 capsule Prescott Truex, NP      PDMP not reviewed this encounter.     [1]  Social History Tobacco Use   Smoking status: Never    Passive exposure: Never   Smokeless tobacco: Never  Vaping Use   Vaping status: Never Used  Substance Use Topics   Alcohol use: Yes    Comment: very  rarely   Drug use: Never     Eleanor Dimichele, Rilla, NP 10/24/24 1621  "

## 2024-10-24 NOTE — Discharge Instructions (Signed)
 Your flu test is negative Rest, push fluids, most likely this is a viral illness and will need to run its course.  Take Tessalon  as prescribed, if you develop chest pain, shortness of breath, or worsening symptoms go to emergency room for further evaluation.

## 2024-10-24 NOTE — ED Triage Notes (Signed)
 Pt c/o sneezing, cough, and post nasal drainage that began Saturday. Pt reports having bilateral ear discomfort, worse on the right (started today).   Home interventions: OTC cough medications

## 2024-11-08 NOTE — Progress Notes (Signed)
 "     Kendra Console, PA-C [address] [place], [place]  [address] Phone: [phone]   Primary Care Physician: Rosalynn Camie CROME, MD  Primary Gastroenterologist:  Kendra Console, PA-C / Dr. Gordy Starch   Chief Complaint:  Followup diverticulitis, diarrhea, and fatty liver     HPI:   Discussed the use of AI scribe software for clinical note transcription with the patient, who gave verbal consent to proceed.  I last saw patient 09/26/2024 to evaluate diarrhea and follow-up diverticulitis and hepatic steatosis.  She was treated 07/2024 for uncomplicated mild sigmoid diverticulitis (confirmed on CT) with Levaquin  and Flagyl .  Continued to have persistent LLQ pain post antibiotic treatment.  Had diarrhea before and after antibiotics.  10/02/2024 repeat abdominal pelvic CT: Previous diverticulitis had resolved.  There was diverticulosis but no active diverticulitis.  Mild hepatic steatosis.  Normal gallbladder.  No bowel inflammation.  09/26/2024 labs: Normal CBC, CMP, CRP.  09/27/2024 stool test: GI pathogen PCR negative.  C. difficile negative.  Fecal calprotectin normal.  Pancreatic elastase normal.  History of Present Illness Kendra Thompson is a 74 year old female with fatty liver disease, lactose intolerance, and prior diverticulitis who presents for follow-up of diarrhea and evaluation of fatty liver noted on imaging.  Diarrhea and Lactose Intolerance: - Experienced multiple episodes of diarrhea, typically occurring 20-40 minutes after consuming cream in morning coffee - Diarrhea resolved with strict avoidance of lactose-containing products and substitution of lactose-free milk - Bowel habits have normalized with no recurrence of diarrhea - Associated with weight loss, which is viewed positively - Comprehensive workup in December included stool studies for infectious etiologies, fecal calprotectin, and pancreatic elastase, all negative or normal - No current gastrointestinal symptoms  since dietary adjustments  Fatty Liver Disease: - Recent CT scans showed mild hepatomegaly and fatty infiltration of the liver - Expresses concern regarding liver health and is interested in interventions to improve liver function - Dietary modifications to reduce sugar intake and use of lactose-free milk have been ongoing for approximately ten years  Diverticulitis: - Repeat CT scan in December demonstrated resolution of prior diverticulitis - No current symptoms attributable to diverticular disease.  No abdominal pain.  General Gastrointestinal Symptoms: - No abdominal pain, nausea, vomiting, or other gastrointestinal complaints - Feels well overall  02/16/2023 last colonoscopy by Dr. Aneita: 5 sessile polyps (5 mm to 8 mm) removed.  Pathology showed 2 small tubular adenomas and 3 hyperplastic polyps.  Left-sided diverticulosis.  Small internal hemorrhoids.  Adequate prep.  7-year repeat (due 01/2030).   Current Outpatient Medications  Medication Sig Dispense Refill   amLODipine  (NORVASC ) 10 MG tablet TAKE 1 TABLET BY MOUTH EVERY DAY 90 tablet 2   anastrozole  (ARIMIDEX ) 1 MG tablet TAKE 1 TABLET (1 MG TOTAL) BY MOUTH DAILY. START 08/26/2021 90 tablet 4   augmented betamethasone  dipropionate (DIPROLENE  AF) 0.05 % cream Apply topically 2 (two) times daily. 30 g 0   benzonatate  (TESSALON ) 100 MG capsule Take 1 capsule (100 mg total) by mouth every 8 (eight) hours. 21 capsule 0   Candesartan  Cilexetil-HCTZ 32-25 MG TABS TAKE 1 TABLET BY MOUTH EVERY DAY 90 tablet 3   cyclobenzaprine  (FLEXERIL ) 5 MG tablet Take 1 tablet (5 mg total) by mouth 3 (three) times daily as needed for muscle spasms. 30 tablet 0   diclofenac  Sodium (VOLTAREN ) 1 % GEL Apply 2 g topically 4 (four) times daily.     fexofenadine (ALLEGRA) 180 MG tablet Take 180 mg by  mouth daily as needed for allergies or rhinitis.     ibuprofen (ADVIL) 200 MG tablet Take 200-600 mg by mouth every 6 (six) hours as needed for headache or  moderate pain.     MULTIPLE VITAMIN PO Take 1 tablet by mouth daily.     Multiple Vitamins-Minerals (EYE HEALTH AREDS 2 PO)      pantoprazole  (PROTONIX ) 40 MG tablet TAKE 1 TABLET BY MOUTH EVERY DAY 90 tablet 2   triamcinolone  cream (KENALOG ) 0.1 % Apply 1 Application topically 2 (two) times daily. 30 g 0   No current facility-administered medications for this visit.    Allergies as of 11/09/2024 - Review Complete 11/09/2024  Allergen Reaction Noted   Latex  05/20/2021   Tape Rash 12/15/2013    Past Medical History:  Diagnosis Date   Allergy 1971   Arrhythmia 1990   Arthritis    Asthma 1971   has not been present for several years   Breast cancer (HCC) 04/18/2021   Cataract 2020   Family history of breast cancer 04/30/2021   GERD (gastroesophageal reflux disease)    Headache    Migraines occasionally   Hypertension    Seasonal allergies    Wears glasses     Past Surgical History:  Procedure Laterality Date   BREAST LUMPECTOMY Left 05/20/2021   BREAST LUMPECTOMY WITH RADIOACTIVE SEED LOCALIZATION Left 05/20/2021   Procedure: LEFT BREAST BRACKETED LUMPECTOMY WITH RADIOACTIVE SEED LOCALIZATION x3;  Surgeon: Aron Shoulders, MD;  Location: [facility] OR;  Service: General;  Laterality: Left;   CLOSED REDUCTION FINGER WITH PERCUTANEOUS PINNING Left 07/25/2013   Procedure: CLOSED REDUCTION FINGER WITH PERCUTANEOUS PINNING LEFT SMALL METACARPAL;  Surgeon: Franky JONELLE Curia, MD;  Location: [facility];  Service: Orthopedics;  Laterality: Left;   COLONOSCOPY  2013,2014   DIAGNOSTIC LAPAROSCOPY  1992   explor-   DILATION AND CURETTAGE OF UTERUS     EYE SURGERY  2023   Cataract surgery sched for 04/15/2022   FRACTURE SURGERY  2014   HAND SURGERY  02/2020    Review of Systems:    All systems reviewed and negative except where noted in HPI.    Physical Exam:  BP (!) 150/70   Pulse 62   Ht 5' 7 (1.702 m)   Wt 237 lb (107.5 kg)   BMI 37.12 kg/m  No LMP recorded. Patient  is postmenopausal.  General: Well-nourished, obese, in no acute distress.  Neuro: Alert and oriented x 3.  Grossly intact.  Psych: Alert and cooperative, normal mood and affect.  Imaging Studies: No results found.  Labs: CBC    Component Value Date/Time   WBC 10.2 09/26/2024 0917   RBC 4.68 09/26/2024 0917   HGB 13.9 09/26/2024 0917   HGB 13.0 03/11/2023 1143   HCT 41.6 09/26/2024 0917   HCT 38.7 03/11/2023 1143   PLT 281.0 09/26/2024 0917   PLT 304 03/11/2023 1143   MCV 88.9 09/26/2024 0917   MCV 88 03/11/2023 1143   MCH 29.4 03/11/2023 1143   MCH 29.9 04/30/2021 0754   MCHC 33.5 09/26/2024 0917   RDW 14.6 09/26/2024 0917   RDW 12.6 03/11/2023 1143   LYMPHSABS 2.3 09/26/2024 0917   MONOABS 0.9 09/26/2024 0917   EOSABS 0.2 09/26/2024 0917   BASOSABS 0.1 09/26/2024 0917    CMP     Component Value Date/Time   NA 136 09/26/2024 0917   NA 139 05/31/2024 1104   K 3.8 09/26/2024 0917   CL 97  09/26/2024 0917   CO2 30 09/26/2024 0917   GLUCOSE 119 (H) 09/26/2024 0917   BUN 18 09/26/2024 0917   BUN 11 05/31/2024 1104   CREATININE 0.78 09/26/2024 0917   CREATININE 0.93 04/30/2021 0754   CREATININE 0.76 09/09/2016 0928   CALCIUM 9.7 09/26/2024 0917   PROT 7.9 09/26/2024 0917   PROT 7.2 05/31/2024 1104   ALBUMIN 4.4 09/26/2024 0917   ALBUMIN 4.4 05/31/2024 1104   AST 14 09/26/2024 0917   AST 17 04/30/2021 0754   ALT 21 09/26/2024 0917   ALT 20 04/30/2021 0754   ALKPHOS 78 09/26/2024 0917   BILITOT 0.4 09/26/2024 0917   BILITOT 0.5 05/31/2024 1104   BILITOT 0.6 04/30/2021 0754   GFRNONAA >60 04/30/2021 0754   GFRNONAA 84 09/09/2016 0928   GFRAA 81 02/19/2020 0915   GFRAA >89 09/09/2016 0928     Assessment and Plan:   Kendra Thompson is a 74 y.o. y/o female returns for followup of:  1.  History of uncomplicated diverticulitis treated 07/2024 with Levaquin  and Flagyl .  Repeat CT 09/2024 showed resolution. - No current abdominal pain. - Followup if she has  recurrent abdominal pain.  2.  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  GI pathogen PCR negative.  Fecal calprotectin and empiric elastase normal.  Diarrhea has resolved. - Continue Lactose Free diet.  3.  Hepatic steatosis with mild hepatomegaly Chronic fatty liver disease with hepatomegaly, no cirrhosis, normal liver function tests. Motivated for management with some weight loss achieved. - Order RUQ liver ultrasound with elastography.  Evaluate for fibrosis. - Counseled on weight loss, low fat, low carbohydrate diet, minimizing sugar intake. - Emphasized regular exercise. - Reviewed Birch.brandt and Rezdiffra indications for MASLD with F2 or F3 fibrosis, not typically covered for F1 fibrosis.    Kendra Console, PA-C  Follow up As Needed.  "

## 2024-11-09 ENCOUNTER — Encounter: Payer: Self-pay | Admitting: Physician Assistant

## 2024-11-09 ENCOUNTER — Ambulatory Visit: Admitting: Physician Assistant

## 2024-11-09 VITALS — BP 150/70 | HR 62 | Ht 67.0 in | Wt 237.0 lb

## 2024-11-09 DIAGNOSIS — Z8719 Personal history of other diseases of the digestive system: Secondary | ICD-10-CM | POA: Diagnosis not present

## 2024-11-09 DIAGNOSIS — E739 Lactose intolerance, unspecified: Secondary | ICD-10-CM

## 2024-11-09 DIAGNOSIS — R197 Diarrhea, unspecified: Secondary | ICD-10-CM

## 2024-11-09 DIAGNOSIS — K760 Fatty (change of) liver, not elsewhere classified: Secondary | ICD-10-CM

## 2024-11-09 NOTE — Patient Instructions (Signed)
 You have been scheduled for an abdominal ultrasound at [facility] (1st floor of hospital) on Monday, 11/13/24 at 11:00 am. Please arrive 30 minutes prior to your appointment for registration. Make certain not to have anything to eat or drink 6 hours prior to your appointment. Should you need to reschedule your appointment, please contact radiology at [phone]. This test typically takes about 30 minutes to perform.  Follow up as needed.   Thank you for trusting me with your gastrointestinal care!   [name], PA-C  _______________________________________________________  If your blood pressure at your visit was 140/90 or greater, please contact your primary care physician to follow up on this.  _______________________________________________________  If you are age 78 or older, your body mass index should be between 23-30. Your Body mass index is 37.12 kg/m. If this is out of the aforementioned range listed, please consider follow up with your Primary Care Provider.  If you are age 64 or younger, your body mass index should be between 19-25. Your Body mass index is 37.12 kg/m. If this is out of the aformentioned range listed, please consider follow up with your Primary Care Provider.   ________________________________________________________  The [facility] providers would like to encourage you to use MYCHART to communicate with providers for non-urgent requests or questions.  Due to long hold times on the telephone, sending your provider a message by [facility] may be a faster and more efficient way to get a response.  Please allow 48 business hours for a response.  Please remember that this is for non-urgent requests.  _______________________________________________________  [facility] is using a team-based approach to care.  Your team is made up of your doctor and two to three APPS. Our APPS (Nurse Practitioners and Physician Assistants) work with your  physician to ensure care continuity for you. They are fully qualified to address your health concerns and develop a treatment plan. They communicate directly with your gastroenterologist to care for you. Seeing the Advanced Practice Practitioners on your physician's team can help you by facilitating care more promptly, often allowing for earlier appointments, access to diagnostic testing, procedures, and other specialty referrals.

## 2024-11-13 ENCOUNTER — Ambulatory Visit: Payer: Self-pay | Admitting: Physician Assistant

## 2024-11-13 ENCOUNTER — Ambulatory Visit (HOSPITAL_COMMUNITY)
Admission: RE | Admit: 2024-11-13 | Discharge: 2024-11-13 | Disposition: A | Discharge status: Home / Self Care | Source: Ambulatory Visit | Attending: Physician Assistant | Admitting: Physician Assistant

## 2024-11-13 DIAGNOSIS — K760 Fatty (change of) liver, not elsewhere classified: Secondary | ICD-10-CM | POA: Diagnosis present

## 2025-01-02 ENCOUNTER — Inpatient Hospital Stay: Admitting: Hematology and Oncology

## 2025-02-07 ENCOUNTER — Encounter

## 2025-02-12 ENCOUNTER — Encounter
